# Patient Record
Sex: Male | Born: 1970
Health system: Southern US, Community
[De-identification: ages and names within clinical notes are randomized; demographics above are authoritative.]

## PROBLEM LIST (undated history)

## (undated) DIAGNOSIS — K219 Gastro-esophageal reflux disease without esophagitis: Secondary | ICD-10-CM

## (undated) DIAGNOSIS — K5900 Constipation, unspecified: Secondary | ICD-10-CM

## (undated) DIAGNOSIS — I10 Essential (primary) hypertension: Secondary | ICD-10-CM

## (undated) DIAGNOSIS — T7840XA Allergy, unspecified, initial encounter: Secondary | ICD-10-CM

## (undated) HISTORY — DX: Allergy, unspecified, initial encounter: T78.40XA

## (undated) HISTORY — PX: UPPER GASTROINTESTINAL ENDOSCOPY: SHX188

## (undated) HISTORY — DX: Gastro-esophageal reflux disease without esophagitis: K21.9

## (undated) HISTORY — DX: Constipation, unspecified: K59.00

## (undated) HISTORY — DX: Essential (primary) hypertension: I10

---

## 2003-01-07 ENCOUNTER — Emergency Department (HOSPITAL_COMMUNITY): Admission: EM | Admit: 2003-01-07 | Discharge: 2003-01-07 | Payer: Self-pay | Admitting: Emergency Medicine

## 2003-01-07 ENCOUNTER — Encounter: Payer: Self-pay | Admitting: Emergency Medicine

## 2008-07-22 ENCOUNTER — Ambulatory Visit: Payer: Self-pay | Admitting: Family Medicine

## 2008-07-22 DIAGNOSIS — K219 Gastro-esophageal reflux disease without esophagitis: Secondary | ICD-10-CM | POA: Insufficient documentation

## 2008-07-22 DIAGNOSIS — J45909 Unspecified asthma, uncomplicated: Secondary | ICD-10-CM | POA: Insufficient documentation

## 2008-07-22 DIAGNOSIS — B009 Herpesviral infection, unspecified: Secondary | ICD-10-CM | POA: Insufficient documentation

## 2008-07-27 ENCOUNTER — Emergency Department (HOSPITAL_COMMUNITY): Admission: EM | Admit: 2008-07-27 | Discharge: 2008-07-27 | Payer: Self-pay | Admitting: Emergency Medicine

## 2008-08-02 LAB — CONVERTED CEMR LAB
Albumin: 3.9 g/dL (ref 3.5–5.2)
Alkaline Phosphatase: 58 units/L (ref 39–117)
BUN: 16 mg/dL (ref 6–23)
Basophils Absolute: 0 10*3/uL (ref 0.0–0.1)
Bilirubin, Direct: 0.1 mg/dL (ref 0.0–0.3)
CO2: 30 meq/L (ref 19–32)
Chloride: 110 meq/L (ref 96–112)
Cholesterol: 174 mg/dL (ref 0–200)
Creatinine, Ser: 1.1 mg/dL (ref 0.4–1.5)
Eosinophils Absolute: 0.2 10*3/uL (ref 0.0–0.7)
Eosinophils Relative: 3.5 % (ref 0.0–5.0)
GFR calc Af Amer: 98 mL/min
Glucose, Bld: 86 mg/dL (ref 70–99)
HCT: 45 % (ref 39.0–52.0)
HDL: 28.3 mg/dL — ABNORMAL LOW (ref >39.0)
LDL Cholesterol: 136 mg/dL — ABNORMAL HIGH (ref 0–99)
Lymphocytes Relative: 44.9 % (ref 12.0–46.0)
MCV: 84.8 fL (ref 78.0–100.0)
Monocytes Absolute: 0.5 10*3/uL (ref 0.1–1.0)
Monocytes Relative: 7.7 % (ref 3.0–12.0)
Neutro Abs: 2.7 10*3/uL (ref 1.4–7.7)
Platelets: 266 10*3/uL (ref 150–400)
Potassium: 4.3 meq/L (ref 3.5–5.1)
RBC: 5.31 M/uL (ref 4.22–5.81)
RDW: 13 % (ref 11.5–14.6)
Sodium: 145 meq/L (ref 135–145)
TSH: 1.08 microintl units/mL (ref 0.35–5.50)
Total Bilirubin: 0.7 mg/dL (ref 0.3–1.2)
Total CHOL/HDL Ratio: 6.1
Total Protein: 7.6 g/dL (ref 6.0–8.3)
Triglycerides: 50 mg/dL (ref 0–149)
VLDL: 10 mg/dL (ref 0–40)

## 2008-10-26 ENCOUNTER — Ambulatory Visit: Payer: Self-pay | Admitting: Family Medicine

## 2008-10-26 DIAGNOSIS — K2970 Gastritis, unspecified, without bleeding: Secondary | ICD-10-CM | POA: Insufficient documentation

## 2008-10-26 DIAGNOSIS — K2990 Gastroduodenitis, unspecified, without bleeding: Secondary | ICD-10-CM

## 2008-11-11 ENCOUNTER — Telehealth: Payer: Self-pay | Admitting: Family Medicine

## 2009-03-03 ENCOUNTER — Telehealth: Payer: Self-pay | Admitting: Family Medicine

## 2010-05-14 ENCOUNTER — Ambulatory Visit: Payer: Self-pay | Admitting: Family Medicine

## 2010-05-14 DIAGNOSIS — M722 Plantar fascial fibromatosis: Secondary | ICD-10-CM

## 2010-07-16 ENCOUNTER — Emergency Department (HOSPITAL_COMMUNITY): Admission: EM | Admit: 2010-07-16 | Discharge: 2010-07-16 | Payer: Self-pay | Admitting: Emergency Medicine

## 2010-09-03 ENCOUNTER — Encounter: Payer: Self-pay | Admitting: Family Medicine

## 2010-11-02 ENCOUNTER — Ambulatory Visit: Payer: Self-pay | Admitting: Internal Medicine

## 2010-11-02 DIAGNOSIS — R1012 Left upper quadrant pain: Secondary | ICD-10-CM

## 2010-12-25 NOTE — Assessment & Plan Note (Signed)
Summary: STOMACH DISCOMFORT // RS   Vital Signs:  Patient profile:   40 year old male Weight:      288 pounds O2 Sat:      97 % on Room air Temp:     98.7 degrees F oral Pulse rate:   69 / minute BP sitting:   130 / 80  (left arm) Cuff size:   large  Vitals Entered By: Romualdo Bolk, CMA (AAMA) (November 02, 2010 9:27 AM)  O2 Flow:  Room air CC: Pt is having LUQ buldge in his stomach that has gotten bigger. Pt feels pressure when he eats and states that he doesn't feel full after eating.   CC:  Pt is having LUQ buldge in his stomach that has gotten bigger. Pt feels pressure when he eats and states that he doesn't feel full after eating.[name]  History of Present Illness: Patient presents to clinic as a workin for evaluation of abdominal discomfort.  Pt with longstanding h/o GERD previously successfully treated with ppi but dc'ed the medication several years ago after dietary change.  Now presents with recent increase in belching/bloating worse postprandially. Has intermittent LUQ discomfort without radiation or tenderness. Denies n/v, hematemesis, hematochezia, significant change in bowel habits or wt loss. Denies typical "heartburn" symptoms or dysphagia.   Preventive Screening-Counseling & Management  Alcohol-Tobacco     Smoking Status: never  Current Medications (verified): 1)  Aciphex 20 Mg Tbec (Rabeprazole Sodium) .... Once Daily 2)  Indomethacin 50 Mg Caps (Indomethacin) .... Three Times A Day As Needed Pain 3)  Ventolin Hfa 108 (90 Base) Mcg/act Aers (Albuterol Sulfate) .... 2 Puffs Q4h As Needed  Allergies (verified): No Known Drug Allergies  Review of Systems      See HPI  Physical Exam  General:  Well-developed,well-nourished,in no acute distress; alert,appropriate and cooperative throughout examination Abdomen:  soft, normal bowel sounds, no distention, no masses, no guarding, no rigidity, no rebound tenderness, no hepatomegaly, and no splenomegaly.  Minimal  discomfort to palpation LUQ.   Impression & Recommendations:  Problem # 1:  ABDOMINAL PAIN, LEFT UPPER QUADRANT (ICD-789.02) Suspect underlying gastritis. Benign exam and hx without red flags. Recommend beginning PPI therapy. Followup closely if no improvement or worsening.  Complete Medication List: 1)  Indomethacin 50 Mg Caps (Indomethacin) .... Three times a day as needed pain 2)  Ventolin Hfa 108 (90 Base) Mcg/act Aers (Albuterol sulfate) .... 2 puffs q4h as needed 3)  Aciphex 20 Mg Tbec (Rabeprazole sodium) .... One by mouth qd  Patient Instructions: 1)  Please schedule a follow-up appointment as needed. 2)  Avoid foods high in acid (tomatoes, citrus juices, spicy foods). Avoid eating within two hours of lying down or before exercising. Do not over eat; try smaller more frequent meals. Elevate head of bed twelve inches when sleeping. Prescriptions: ACIPHEX 20 MG TBEC (RABEPRAZOLE SODIUM) one by mouth qd  #30 x 11   Entered and Authorized by:   Edwyna Perfect MD   Signed by:   Edwyna Perfect MD on 11/02/2010   Method used:   Electronically to        [facility] [address] #[id number]* (retail)       [address] [place], [place]  [address]       Ph: [phone]       Fax: [phone]   RxID:   [id number]    Orders Added: 1)  Est. Patient Level IV [38756]

## 2010-12-25 NOTE — Letter (Signed)
Summary: [facility]  [facility]   Imported By: Maryln Gottron 09/19/2010 14:49:58  _____________________________________________________________________  External Attachment:    Type:   Image     Comment:   External Document

## 2010-12-25 NOTE — Assessment & Plan Note (Signed)
Summary: L FOOT PAIN // RS   Vital Signs:  Patient profile:   40 year old male Weight:      275 pounds BMI:     41.96 BP sitting:   118 / 84  (left arm) Cuff size:   large  Vitals Entered By: Raechel Ache, RN (May 14, 2010 3:31 PM) CC: C/o L foot pain which started last night, feels like a cramp and hurts to flex.   History of Present Illness: Here for the sudden onset of pain in the bottom of the left foot yesterday. No trauma but he had played golf on Saturday. Felt okay yesterday morning, but then after lying on the couch awhile he had sharp pains in the foot as tried to stand up. The arch is very painful, and it hurts to walk. no swelling.   Allergies (verified): No Known Drug Allergies  Past History:  Past Medical History: Reviewed history from 07/22/2008 and no changes required. Asthma GERD  Past Surgical History: Reviewed history from 07/22/2008 and no changes required. Denies surgical history  Review of Systems  The patient denies anorexia, fever, weight loss, weight gain, vision loss, decreased hearing, hoarseness, chest pain, syncope, dyspnea on exertion, peripheral edema, prolonged cough, headaches, hemoptysis, abdominal pain, melena, hematochezia, severe indigestion/heartburn, hematuria, incontinence, genital sores, muscle weakness, suspicious skin lesions, transient blindness, difficulty walking, depression, unusual weight change, abnormal bleeding, enlarged lymph nodes, angioedema, breast masses, and testicular masses.    Physical Exam  General:  walks with a limp Msk:  the arch of the left foot is very tender from the ball to the heel. No swelling or erythema   Impression & Recommendations:  Problem # 1:  PLANTAR FASCIITIS (ICD-728.71)  His updated medication list for this problem includes:    Indomethacin 50 Mg Caps (Indomethacin) .[name]... Three times a day as needed pain  Complete Medication List: 1)  Aciphex 20 Mg Tbec (Rabeprazole sodium) .... Once  daily 2)  Indomethacin 50 Mg Caps (Indomethacin) .... Three times a day as needed pain  Patient Instructions: 1)  off work today until 05-21-10. Rest, stay off feet. Use heat. 2)  Please schedule a follow-up appointment as needed .  Prescriptions: INDOMETHACIN 50 MG CAPS (INDOMETHACIN) three times a day as needed pain  #60 x 2   Entered and Authorized by:   Nelwyn Salisbury MD   Signed by:   Nelwyn Salisbury MD on 05/14/2010   Method used:   Electronically to        [facility] [address] #[id number]* (retail)       [address] [place], [place]  [address]       Ph: [phone]       Fax: [phone]   RxID:   [id number]

## 2011-06-07 ENCOUNTER — Other Ambulatory Visit (INDEPENDENT_AMBULATORY_CARE_PROVIDER_SITE_OTHER): Payer: BC Managed Care – PPO

## 2011-06-07 DIAGNOSIS — Z0000 Encounter for general adult medical examination without abnormal findings: Secondary | ICD-10-CM

## 2011-06-07 LAB — LIPID PANEL
Cholesterol: 180 mg/dL (ref 0–200)
HDL: 42.8 mg/dL (ref >39.00)
LDL Cholesterol: 125 mg/dL — ABNORMAL HIGH (ref 0–99)
Triglycerides: 59 mg/dL (ref 0.0–149.0)
VLDL: 11.8 mg/dL (ref 0.0–40.0)

## 2011-06-07 LAB — HEPATIC FUNCTION PANEL
ALT: 29 U/L (ref 0–53)
AST: 16 U/L (ref 0–37)
Albumin: 4.4 g/dL (ref 3.5–5.2)
Alkaline Phosphatase: 48 U/L (ref 39–117)
Bilirubin, Direct: 0.1 mg/dL (ref 0.0–0.3)
Total Bilirubin: 0.5 mg/dL (ref 0.3–1.2)
Total Protein: 7.8 g/dL (ref 6.0–8.3)

## 2011-06-07 LAB — CBC WITH DIFFERENTIAL/PLATELET
Basophils Absolute: 0 10*3/uL (ref 0.0–0.1)
Basophils Relative: 0.5 % (ref 0.0–3.0)
Eosinophils Absolute: 0.2 10*3/uL (ref 0.0–0.7)
Eosinophils Relative: 3.1 % (ref 0.0–5.0)
HCT: 45.7 % (ref 39.0–52.0)
Hemoglobin: 15.6 g/dL (ref 13.0–17.0)
Lymphocytes Relative: 44.8 % (ref 12.0–46.0)
Lymphs Abs: 2.8 10*3/uL (ref 0.7–4.0)
MCHC: 34.1 g/dL (ref 30.0–36.0)
MCV: 85.6 fl (ref 78.0–100.0)
Monocytes Absolute: 0.4 10*3/uL (ref 0.1–1.0)
Monocytes Relative: 6.9 % (ref 3.0–12.0)
Neutro Abs: 2.8 10*3/uL (ref 1.4–7.7)
Neutrophils Relative %: 44.7 % (ref 43.0–77.0)
Platelets: 288 10*3/uL (ref 150.0–400.0)
RBC: 5.34 Mil/uL (ref 4.22–5.81)
RDW: 13.9 % (ref 11.5–14.6)
WBC: 6.3 10*3/uL (ref 4.5–10.5)

## 2011-06-07 LAB — BASIC METABOLIC PANEL
BUN: 15 mg/dL (ref 6–23)
CO2: 25 mEq/L (ref 19–32)
Calcium: 8.9 mg/dL (ref 8.4–10.5)
Chloride: 110 mEq/L (ref 96–112)
GFR: 96.34 mL/min (ref >60.00)
Glucose, Bld: 78 mg/dL (ref 70–99)
Potassium: 3.8 mEq/L (ref 3.5–5.1)
Sodium: 140 mEq/L (ref 135–145)

## 2011-06-07 LAB — POCT URINALYSIS DIPSTICK
Bilirubin, UA: NEGATIVE
Glucose, UA: NEGATIVE
Ketones, UA: NEGATIVE
Nitrite, UA: NEGATIVE
Protein, UA: NEGATIVE
Spec Grav, UA: 1.02
Urobilinogen, UA: 0.2
pH, UA: 6.5

## 2011-06-07 LAB — TSH: TSH: 1.91 u[IU]/mL (ref 0.35–5.50)

## 2011-06-10 ENCOUNTER — Encounter: Payer: Self-pay | Admitting: Family Medicine

## 2011-06-11 ENCOUNTER — Encounter: Payer: Self-pay | Admitting: Family Medicine

## 2011-06-11 ENCOUNTER — Ambulatory Visit (INDEPENDENT_AMBULATORY_CARE_PROVIDER_SITE_OTHER): Payer: BC Managed Care – PPO | Admitting: Family Medicine

## 2011-06-11 VITALS — BP 124/86 | HR 69 | Temp 98.2°F | Wt 288.0 lb

## 2011-06-11 DIAGNOSIS — Z0000 Encounter for general adult medical examination without abnormal findings: Secondary | ICD-10-CM

## 2011-06-11 MED ORDER — RABEPRAZOLE SODIUM 20 MG PO TBEC: 20.0000 mg | DELAYED_RELEASE_TABLET | Freq: Every day | ORAL | Status: DC

## 2011-06-11 MED ORDER — FAMCICLOVIR 500 MG PO TABS: 500.0000 mg | ORAL_TABLET | Freq: Two times a day (BID) | ORAL | Status: DC

## 2011-06-11 NOTE — Progress Notes (Signed)
  Subjective:    Patient ID: Daniel [name], male    DOB: 02/23/1971, 40 y.o.   MRN: [id number]  HPI 40 yr old male for a cpx. He has one recent problem to talk about. He has never had a BP problem, but several weeks ago he began to experience HAs, numbness in the left arm, and chest tightness. No chest pains or sweats or nausea. He went to his pharmacy on 05-29-11 and found his BP to be 168/128. He then went to an Urgent Care and his BP was 152/104. His EKG was normal. He does not use tobacco. He realized that he has been using Herbalife supplements twice a day for 5 months, and he has been drinking a lot of Gatorade daily. He stopped both these 2 weeks ago, and his BP has come back down to normal. His symptoms have all stopped, and he feels fine again.    Review of Systems  Constitutional: Negative.   HENT: Negative.   Eyes: Negative.   Respiratory: Negative.   Cardiovascular: Negative.   Gastrointestinal: Negative.   Genitourinary: Negative.   Musculoskeletal: Negative.   Skin: Negative.   Neurological: Negative.   Hematological: Negative.   Psychiatric/Behavioral: Negative.        Objective:   Physical Exam  Constitutional: He is oriented to person, place, and time. He appears well-developed and well-nourished. No distress.  HENT:  Head: Normocephalic and atraumatic.  Right Ear: External ear normal.  Left Ear: External ear normal.  Nose: Nose normal.  Mouth/Throat: Oropharynx is clear and moist. No oropharyngeal exudate.  Eyes: Conjunctivae and EOM are normal. Pupils are equal, round, and reactive to light. Right eye exhibits no discharge. Left eye exhibits no discharge. No scleral icterus.  Neck: Neck supple. No JVD present. No tracheal deviation present. No thyromegaly present.  Cardiovascular: Normal rate, regular rhythm, normal heart sounds and intact distal pulses.  Exam reveals no gallop and no friction rub.   No murmur heard. Pulmonary/Chest: Effort normal and breath  sounds normal. No respiratory distress. He has no wheezes. He has no rales. He exhibits no tenderness.  Abdominal: Soft. Bowel sounds are normal. He exhibits no distension and no mass. There is no tenderness. There is no rebound and no guarding.  Genitourinary: Rectum normal, prostate normal and penis normal. Guaiac negative stool. No penile tenderness.  Musculoskeletal: Normal range of motion. He exhibits no edema and no tenderness.  Lymphadenopathy:    He has no cervical adenopathy.  Neurological: He is alert and oriented to person, place, and time. He has normal reflexes. No cranial nerve deficit. He exhibits normal muscle tone. Coordination normal.  Skin: Skin is warm and dry. No rash noted. He is not diaphoretic. No erythema. No pallor.  Psychiatric: He has a normal mood and affect. His behavior is normal. Judgment and thought content normal.          Assessment & Plan:  I think his recent high BP readings resulted from the Herbalife supplements he was taking and from excessive sodium intake. He will avoid the Herbalife as well as Gatorade and sodas. He will drink water instead. He will monitor his diet, and I advised him to not take in over 2000 mg of sodium a day. He needs to lose weight and exercise. Recheck in 90 days

## 2011-06-13 ENCOUNTER — Emergency Department (INDEPENDENT_AMBULATORY_CARE_PROVIDER_SITE_OTHER): Payer: BC Managed Care – PPO

## 2011-06-13 ENCOUNTER — Encounter (HOSPITAL_BASED_OUTPATIENT_CLINIC_OR_DEPARTMENT_OTHER): Payer: Self-pay | Admitting: *Deleted

## 2011-06-13 ENCOUNTER — Emergency Department (HOSPITAL_BASED_OUTPATIENT_CLINIC_OR_DEPARTMENT_OTHER)
Admission: EM | Admit: 2011-06-13 | Discharge: 2011-06-13 | Disposition: A | Discharge status: Home / Self Care | Payer: BC Managed Care – PPO | Attending: Emergency Medicine | Admitting: Emergency Medicine

## 2011-06-13 DIAGNOSIS — M79609 Pain in unspecified limb: Secondary | ICD-10-CM

## 2011-06-13 DIAGNOSIS — K219 Gastro-esophageal reflux disease without esophagitis: Secondary | ICD-10-CM | POA: Insufficient documentation

## 2011-06-13 DIAGNOSIS — M779 Enthesopathy, unspecified: Secondary | ICD-10-CM

## 2011-06-13 DIAGNOSIS — M7989 Other specified soft tissue disorders: Secondary | ICD-10-CM

## 2011-06-13 DIAGNOSIS — M7730 Calcaneal spur, unspecified foot: Secondary | ICD-10-CM

## 2011-06-13 DIAGNOSIS — J45909 Unspecified asthma, uncomplicated: Secondary | ICD-10-CM | POA: Insufficient documentation

## 2011-06-13 DIAGNOSIS — M79671 Pain in right foot: Secondary | ICD-10-CM

## 2011-06-13 MED ORDER — HYDROCODONE-ACETAMINOPHEN 5-500 MG PO TABS: 1.0000 | ORAL_TABLET | Freq: Four times a day (QID) | ORAL | Status: AC | PRN

## 2011-06-13 NOTE — ED Notes (Signed)
Pt reports left foot pain since yesterday. States bent down at work and when he stood up had sharp pain to top of left foot. No swelling noted. Pt able to move toes and ankle. States painful to walk on and tender to touch.

## 2011-06-13 NOTE — ED Provider Notes (Signed)
History     Chief Complaint  Patient presents with  . Foot Pain   Patient is a 40 y.o. male presenting with lower extremity pain.  Foot Pain This is a new problem. The current episode started yesterday. The problem occurs constantly. The problem has been gradually worsening. The symptoms are aggravated by walking, twisting, stress and standing. The symptoms are relieved by rest. He has tried nothing for the symptoms. The treatment provided no relief.  pt was bending over yesterday and when stood up developed a pain in the inner side of his left foot that is worse with walking and standing and not going away.  Past Medical History  Diagnosis Date  . Asthma   . GERD (gastroesophageal reflux disease)     History reviewed. No pertinent past surgical history.  Family History  Problem Relation Age of Onset  . Hyperlipidemia    . Hypertension    . Stroke      History  Substance Use Topics  . Smoking status: Never Smoker   . Smokeless tobacco: Not on file  . Alcohol Use: No      Review of Systems  All other systems reviewed and are negative.    Physical Exam  BP 158/87  Pulse 80  Temp(Src) 97.7 F (36.5 C) (Oral)  Resp 18  Ht 5\' 8"  (1.727 m)  Wt 278 lb (126.1 kg)  BMI 42.27 kg/m2  SpO2 99%  Physical Exam  Nursing note and vitals reviewed. Constitutional: He is oriented to person, place, and time. He appears well-developed and well-nourished. No distress.  HENT:  Head: Normocephalic and atraumatic.  Musculoskeletal: Normal range of motion. He exhibits tenderness.       Left foot: He exhibits tenderness and bony tenderness. He exhibits no swelling and no deformity.       Feet:  Neurological: He is alert and oriented to person, place, and time.  Skin: Skin is warm and dry. No rash noted. No erythema.  Psychiatric: He has a normal mood and affect. His behavior is normal.    ED Course  Procedures  MDM Pt with pain in the left medial foot without injury.  No  signs of infection.  Plain film showed bone spurs but no acute abnormalities.  Will have pt f/u with sports med if supportive treatment does not work.  Dg Foot Complete Left  06/13/2011  *RADIOLOGY REPORT*  Clinical Data: Pain and swelling  LEFT FOOT - COMPLETE 3+ VIEW  Comparison: None.  Findings: There are small posterior and plantar calcaneal spurs. There is no evidence of fracture or dislocation.  No periosteal reaction or cortical destruction are identified.  No soft tissue gas or calcifications are present.  IMPRESSION: No evidence of acute abnormality.  Small posterior and plantar calcaneal spurs.  Original Report Authenticated By: Brandon Melnick, M.D.      Gwyneth Sprout, MD 06/13/11 1239

## 2011-09-11 ENCOUNTER — Encounter: Payer: Self-pay | Admitting: Family Medicine

## 2011-09-11 ENCOUNTER — Ambulatory Visit (INDEPENDENT_AMBULATORY_CARE_PROVIDER_SITE_OTHER): Payer: BC Managed Care – PPO | Admitting: Family Medicine

## 2011-09-11 VITALS — BP 138/98 | HR 83 | Temp 98.7°F | Wt 287.0 lb

## 2011-09-11 DIAGNOSIS — L309 Dermatitis, unspecified: Secondary | ICD-10-CM

## 2011-09-11 DIAGNOSIS — L259 Unspecified contact dermatitis, unspecified cause: Secondary | ICD-10-CM

## 2011-09-11 DIAGNOSIS — I10 Essential (primary) hypertension: Secondary | ICD-10-CM

## 2011-09-11 MED ORDER — LOSARTAN POTASSIUM-HCTZ 50-12.5 MG PO TABS: 1.0000 | ORAL_TABLET | Freq: Every day | ORAL | Status: DC

## 2011-09-11 MED ORDER — TRIAMCINOLONE ACETONIDE 0.5 % EX CREA: TOPICAL_CREAM | Freq: Three times a day (TID) | CUTANEOUS | Status: DC

## 2011-09-11 NOTE — Progress Notes (Signed)
  Subjective:    Patient ID: Daniel [name], male    DOB: 05/17/1971, 40 y.o.   MRN: [id number]  HPI Here to recheck BP and to ask about a red irritated area on his face that appeared about 3 weeks ago. He has changed his diet, cut back on salt use, and stopped the supplements we talked about last time. He feels good but has not lost any weight.    Review of Systems  Respiratory: Negative.   Cardiovascular: Negative.   Skin: Positive for rash.       Objective:   Physical Exam  Constitutional: He appears well-developed and well-nourished.  Neck: Neck supple. No thyromegaly present.  Cardiovascular: Normal rate, regular rhythm, normal heart sounds and intact distal pulses.   Pulmonary/Chest: Effort normal and breath sounds normal.  Lymphadenopathy:    He has no cervical adenopathy.  Skin:       Small hypopigmented area beside the right corner of his mouth          Assessment & Plan:  Start on HTN meds as below. Try Triamcinolone for the eczema. Recheck in one month

## 2012-03-11 IMAGING — CR DG FOOT COMPLETE 3+V*L*
3 series · 3 of 3 positions shown · non-contrast
Comparison: None.

CLINICAL DATA: Pain and swelling

LEFT FOOT - COMPLETE 3+ VIEW

[t foot ap left]
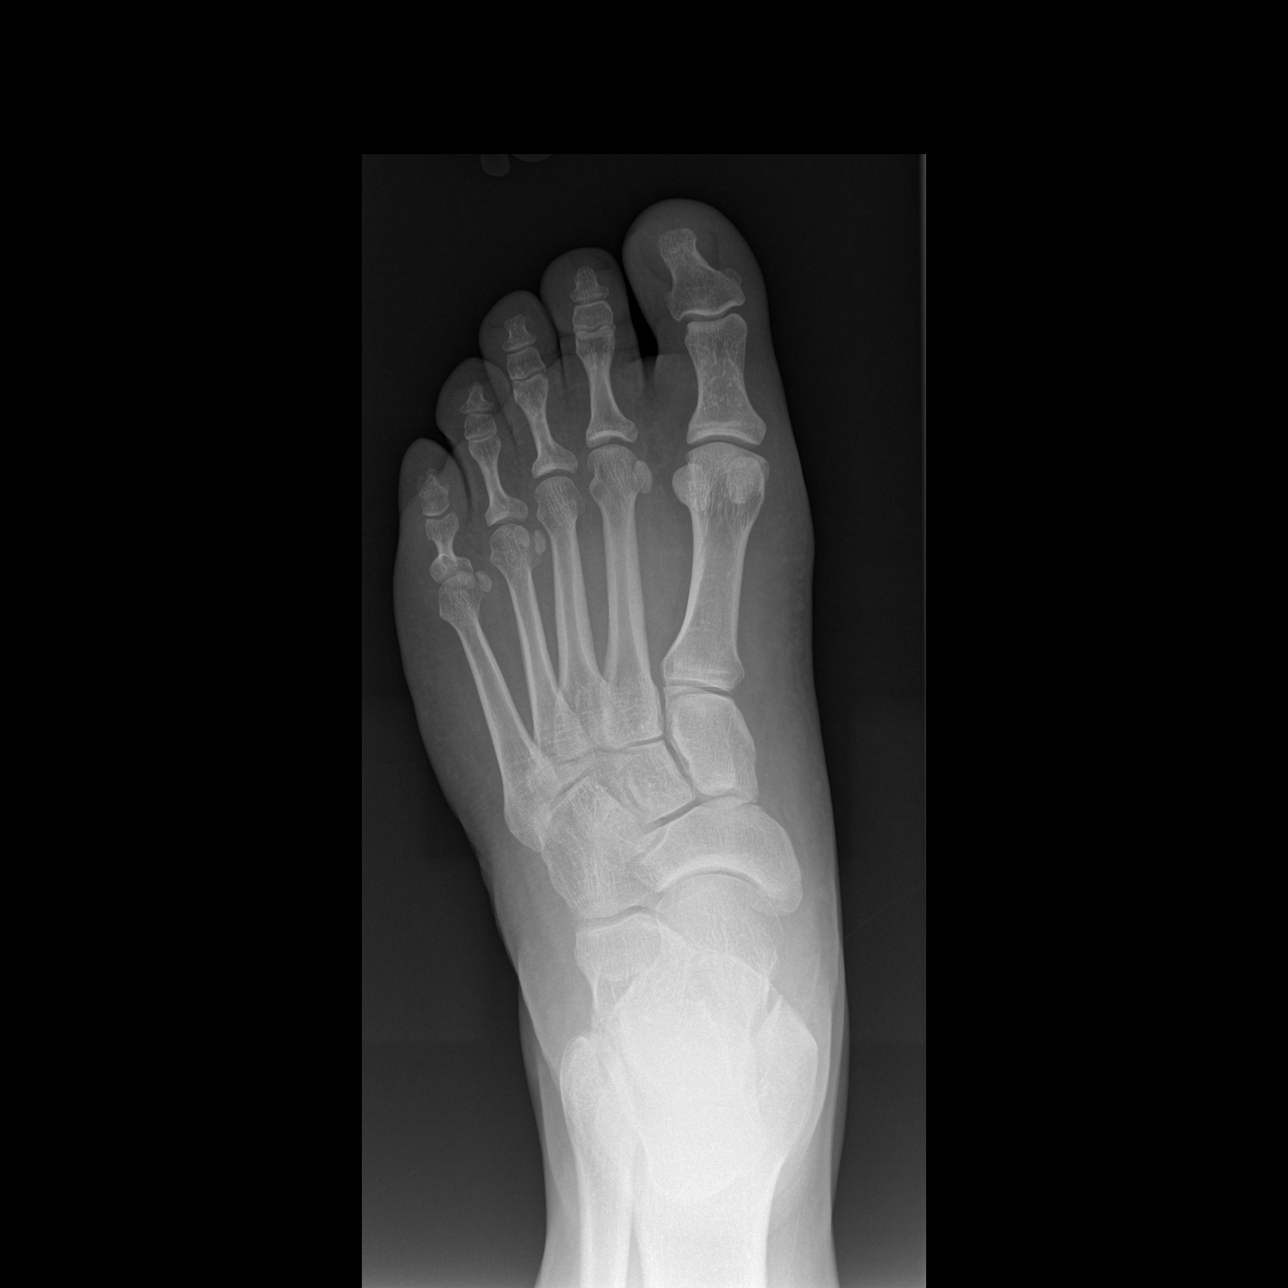

[t foot oblique left]
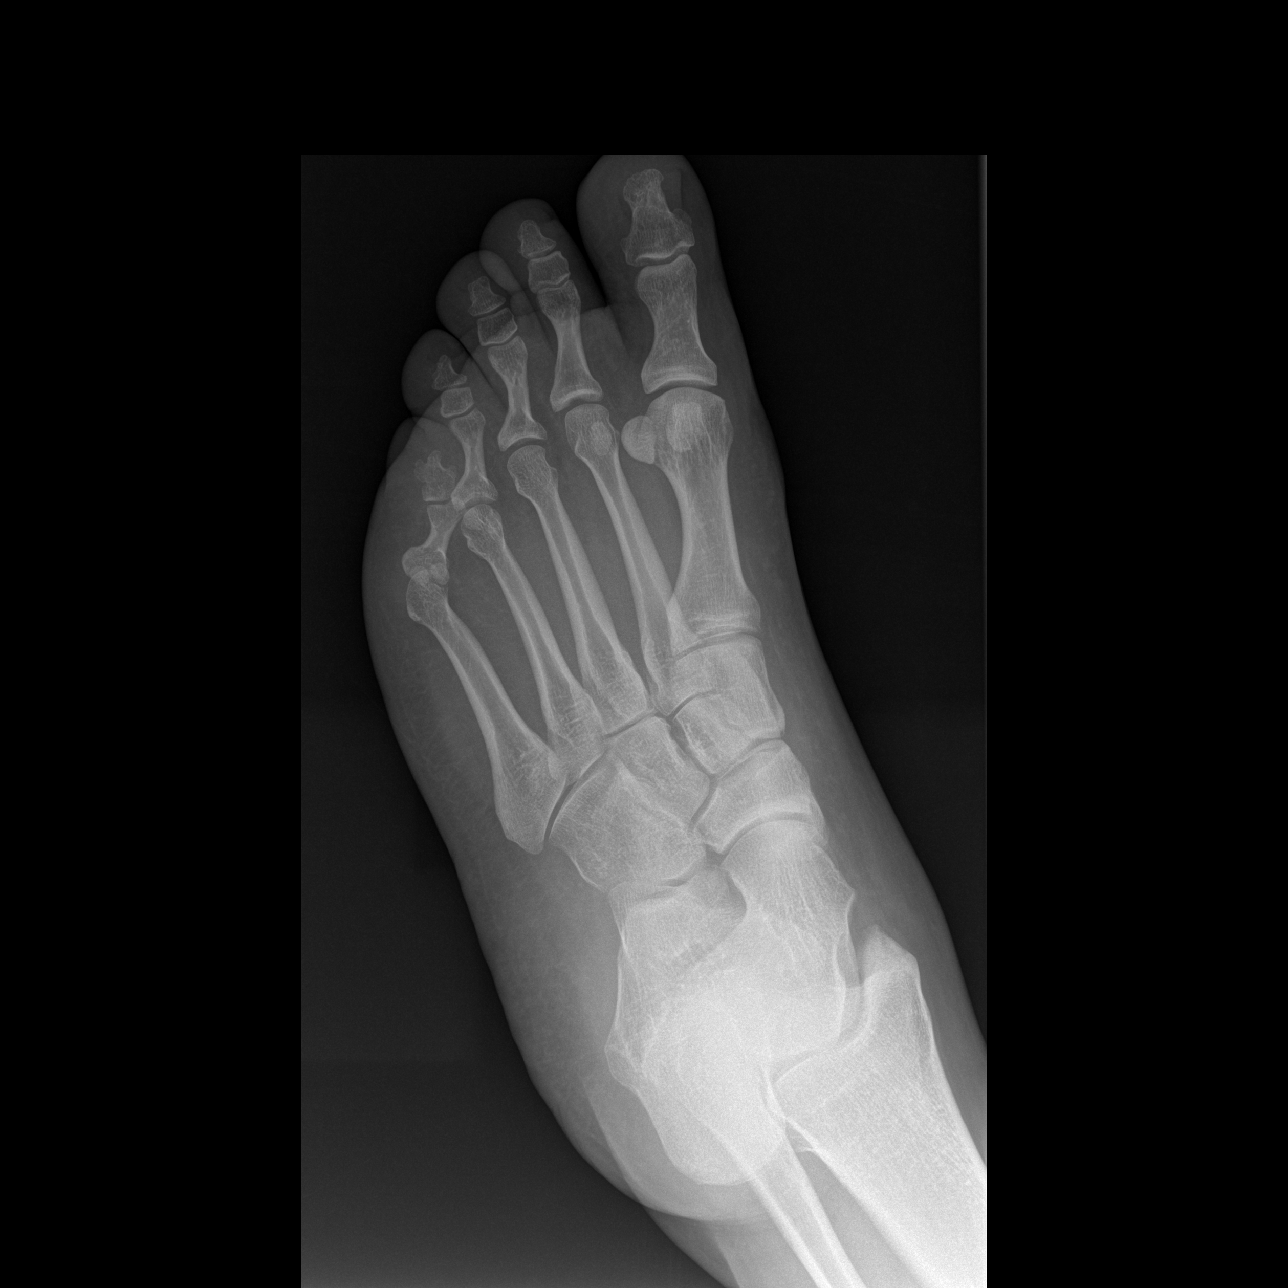

[t foot lat left]
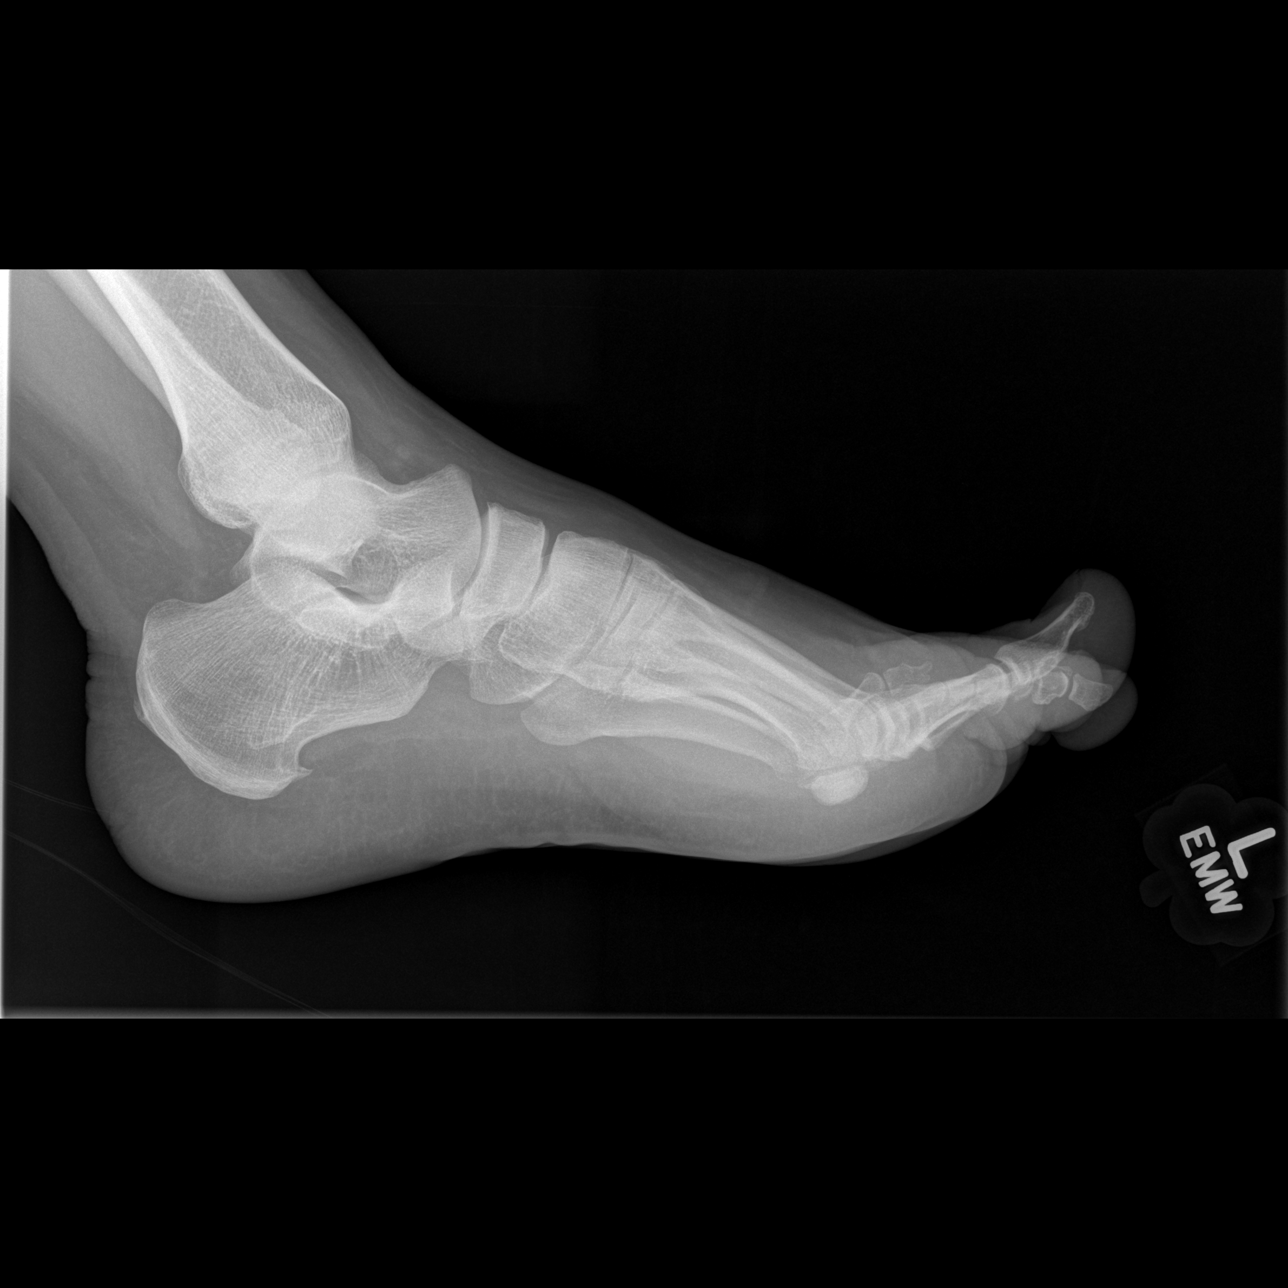

[3 of 3 positions shown; findings below may reference images not displayed]

FINDINGS: There are small posterior and plantar calcaneal spurs.
There is no evidence of fracture or dislocation.  No periosteal
reaction or cortical destruction are identified.  No soft tissue
gas or calcifications are present.
IMPRESSION: No evidence of acute abnormality.

Small posterior and plantar calcaneal spurs.

## 2012-04-22 ENCOUNTER — Other Ambulatory Visit: Payer: Self-pay | Admitting: Family Medicine

## 2012-07-18 ENCOUNTER — Encounter (HOSPITAL_BASED_OUTPATIENT_CLINIC_OR_DEPARTMENT_OTHER): Payer: Self-pay | Admitting: *Deleted

## 2012-07-18 ENCOUNTER — Emergency Department (HOSPITAL_BASED_OUTPATIENT_CLINIC_OR_DEPARTMENT_OTHER)
Admission: EM | Admit: 2012-07-18 | Discharge: 2012-07-18 | Disposition: A | Discharge status: Home / Self Care | Payer: 59 | Attending: Emergency Medicine | Admitting: Emergency Medicine

## 2012-07-18 DIAGNOSIS — T63441A Toxic effect of venom of bees, accidental (unintentional), initial encounter: Secondary | ICD-10-CM

## 2012-07-18 DIAGNOSIS — T63461A Toxic effect of venom of wasps, accidental (unintentional), initial encounter: Secondary | ICD-10-CM | POA: Insufficient documentation

## 2012-07-18 DIAGNOSIS — T6391XA Toxic effect of contact with unspecified venomous animal, accidental (unintentional), initial encounter: Secondary | ICD-10-CM | POA: Insufficient documentation

## 2012-07-18 LAB — CBC WITH DIFFERENTIAL/PLATELET
Eosinophils Relative: 1 % (ref 0–5)
HCT: 45.6 % (ref 39.0–52.0)
Lymphocytes Relative: 33 % (ref 12–46)
Lymphs Abs: 3.9 10*3/uL (ref 0.7–4.0)
MCV: 79.4 fL (ref 78.0–100.0)
Neutro Abs: 6.8 10*3/uL (ref 1.7–7.7)
Platelets: 317 10*3/uL (ref 150–400)
RBC: 5.74 MIL/uL (ref 4.22–5.81)
WBC: 11.7 10*3/uL — ABNORMAL HIGH (ref 4.0–10.5)

## 2012-07-18 LAB — BASIC METABOLIC PANEL
CO2: 24 mEq/L (ref 19–32)
Calcium: 9.9 mg/dL (ref 8.4–10.5)
Chloride: 103 mEq/L (ref 96–112)
Glucose, Bld: 112 mg/dL — ABNORMAL HIGH (ref 70–99)
Sodium: 138 mEq/L (ref 135–145)

## 2012-07-18 MED ORDER — DIPHENHYDRAMINE HCL 25 MG PO TABS: 25.0000 mg | ORAL_TABLET | Freq: Four times a day (QID) | ORAL | Status: DC

## 2012-07-18 MED ORDER — FAMOTIDINE 20 MG PO TABS: 20.0000 mg | ORAL_TABLET | Freq: Two times a day (BID) | ORAL | Status: DC

## 2012-07-18 MED ORDER — SODIUM CHLORIDE 0.9 % IV SOLN: INTRAVENOUS | Status: DC

## 2012-07-18 MED ORDER — PREDNISONE 10 MG PO TABS: 40.0000 mg | ORAL_TABLET | Freq: Every day | ORAL | Status: DC

## 2012-07-18 MED ORDER — SODIUM CHLORIDE 0.9 % IV SOLN
Freq: Once | INTRAVENOUS | Status: AC
  Administered 2012-07-18: 21:00:00 via INTRAVENOUS

## 2012-07-18 MED ORDER — METHYLPREDNISOLONE SODIUM SUCC 125 MG IJ SOLR
125.0000 mg | Freq: Once | INTRAMUSCULAR | Status: AC
  Administered 2012-07-18: 125 mg via INTRAVENOUS
  Filled 2012-07-18: qty 2

## 2012-07-18 MED ORDER — SODIUM CHLORIDE 0.9 % IV BOLUS (SEPSIS)
1000.0000 mL | Freq: Once | INTRAVENOUS | Status: AC
  Administered 2012-07-18: 1000 mL via INTRAVENOUS

## 2012-07-18 MED ORDER — EPINEPHRINE 0.3 MG/0.3ML IJ DEVI: 0.3000 mg | INTRAMUSCULAR | Status: DC | PRN

## 2012-07-18 MED ORDER — FAMOTIDINE IN NACL 20-0.9 MG/50ML-% IV SOLN
20.0000 mg | Freq: Once | INTRAVENOUS | Status: AC
  Administered 2012-07-18: 20 mg via INTRAVENOUS
  Filled 2012-07-18: qty 50

## 2012-07-18 NOTE — ED Notes (Addendum)
Pt was stung by several yellow jackets about 4 hrs ago. Broke out in rash. Took Benadryl. Syncopal episode 30 min ago. Diaphoretic. Hx hospitalization after "swallowing a bee". "Bad situation". Taken to [facility].

## 2012-07-18 NOTE — ED Notes (Signed)
Pt assisted to stretcher- ekg done- pt states he "feels better" lying down- piv started and labs drawn and held- pt on cardiac monitor and pulse ox- EDP Zackowski updated- NS bolus 1000cc infusing- family at bedside

## 2012-07-18 NOTE — ED Notes (Signed)
Zackowski MD at bedside. 

## 2012-07-18 NOTE — ED Provider Notes (Signed)
History  This chart was scribed for Shelda Jakes, MD by Ladona Ridgel Day. This patient was seen in room MHT14/MHT14 and the patient's care was started at 2008.  CSN: [id number]  Arrival date & time 07/18/12  2008   First MD Initiated Contact with Patient 07/18/12 2035      Chief Complaint  Patient presents with  . Insect Bite   Patient is a 41 y.o. male presenting with allergic reaction. The history is provided by the patient. No language interpreter was used.  Allergic Reaction The primary symptoms are  shortness of breath and rash (Left chest/left neck). The primary symptoms do not include wheezing, abdominal pain, nausea or vomiting. The current episode started 3 to 5 hours ago. The problem has been gradually improving.  The onset of the reaction was associated with insect bite/sting.   Daniel [name] is a 41 y.o. male who presents to the Emergency Department complaining of a reaction after being stung by yellow jackets on his left arm/left chest/left neck about 5 hours ago at home. He felt mild SOB and light headed at home and had localized reaction to his left chest. He states his SOB has improved. His BP was 70/40 at triage and was diaphoretic. He states he took benadryl at home after being stung, laid down to rest, then woke up to use the restroom and had one syncopal episode. He states similar previous symptoms when he accidentally swallowed a bee and had low BP and O2 sats. He denies any lip or tongue swelling and he also feels better now. He denies any CP, leg swelling urinary symptoms, HA, abdominal pain, and emesis.  Past Medical History  Diagnosis Date  . Asthma   . GERD (gastroesophageal reflux disease)     History reviewed. No pertinent past surgical history.  Family History  Problem Relation Age of Onset  . Hyperlipidemia    . Hypertension    . Stroke      History  Substance Use Topics  . Smoking status: Never Smoker   . Smokeless tobacco: Never Used  . Alcohol  Use: No      Review of Systems  Constitutional: Negative for fever and chills.  HENT: Negative for facial swelling.        No lip or tongue swelling.   Respiratory: Positive for shortness of breath. Negative for chest tightness and wheezing.   Cardiovascular: Negative for chest pain and leg swelling.  Gastrointestinal: Negative for nausea, vomiting and abdominal pain.  Skin: Positive for rash (Left chest/left neck).  Neurological: Positive for syncope and light-headedness. Negative for weakness.  All other systems reviewed and are negative.    Allergies  Bee venom and Shellfish allergy  Home Medications   Current Outpatient Rx  Name Route Sig Dispense Refill  . DIPHENHYDRAMINE HCL 25 MG PO TABS Oral Take 50 mg by mouth once as needed. For bee sting    . DIPHENHYDRAMINE-ZINC ACETATE 1-0.1 % EX CREA Topical Apply 1 application topically once as needed. For bee sting    . LOSARTAN POTASSIUM-HCTZ 50-12.5 MG PO TABS Oral Take 1 tablet by mouth daily. 30 tablet 11  . ADULT MULTIVITAMIN W/MINERALS CH Oral Take 1 tablet by mouth daily.    [name] POLYVINYL ALCOHOL-POVIDONE 5-6 MG/ML OP SOLN Ophthalmic Apply 1 drop to eye once as needed. For dryness    . RABEPRAZOLE SODIUM 20 MG PO TBEC Oral Take 20 mg by mouth daily as needed. For acid reflux    . VENTOLIN  HFA 108 (90 BASE) MCG/ACT IN AERS  INHALE 2 PUFFS BY MOUTH EVERY 4 HOURS AS NEEDED 1 Inhaler 3  . DIPHENHYDRAMINE HCL 25 MG PO TABS Oral Take 1 tablet (25 mg total) by mouth every 6 (six) hours. 20 tablet 0  . EPINEPHRINE 0.3 MG/0.3ML IJ DEVI Intramuscular Inject 0.3 mLs (0.3 mg total) into the muscle as needed. 2 Device 0  . FAMOTIDINE 20 MG PO TABS Oral Take 1 tablet (20 mg total) by mouth 2 (two) times daily. 14 tablet 0  . PREDNISONE 10 MG PO TABS Oral Take 4 tablets (40 mg total) by mouth daily. 20 tablet 0    Triage Vitals: BP 100/57  Pulse 54  Temp 98.2 F (36.8 C) (Oral)  Resp 20  Ht 5\' 8"  (1.727 m)  Wt 285 lb (129.275 kg)   BMI 43.33 kg/m2  SpO2 98%  Physical Exam  Nursing note and vitals reviewed. Constitutional: He is oriented to person, place, and time. He appears well-developed and well-nourished. No distress.  HENT:  Head: Normocephalic and atraumatic.  Mouth/Throat: Oropharynx is clear and moist.       Tongue/lips are not swollen.  Eyes: EOM are normal.  Neck: Neck supple. No tracheal deviation present.  Cardiovascular: Normal rate, regular rhythm and normal heart sounds.   No murmur heard. Pulmonary/Chest: Effort normal and breath sounds normal. No respiratory distress. He has no wheezes. He has no rales.  Abdominal: Soft. Bowel sounds are normal. He exhibits no distension. There is no tenderness. There is no rebound and no guarding.  Musculoskeletal: Normal range of motion.  Neurological: He is alert and oriented to person, place, and time.  Skin: Skin is warm and dry.       Redness over left lateral chest where he was stung by bees.  Psychiatric: He has a normal mood and affect. His behavior is normal.    ED Course  Procedures (including critical care time) DIAGNOSTIC STUDIES: Oxygen Saturation is 96% on room air, adequate by my interpretation.    COORDINATION OF CARE: At 855 PM Discussed treatment plan with patient which includes IV fluids, and EKG. Patient agrees.   Labs Reviewed  CBC WITH DIFFERENTIAL - Abnormal; Notable for the following:    WBC 11.7 (*)     All other components within normal limits  BASIC METABOLIC PANEL - Abnormal; Notable for the following:    Glucose, Bld 112 (*)     Creatinine, Ser 1.80 (*)     GFR calc non Af Amer 46 (*)     GFR calc Af Amer 53 (*)     All other components within normal limits   Results for orders placed during the hospital encounter of 07/18/12  CBC WITH DIFFERENTIAL      Component Value Range   WBC 11.7 (*) 4.0 - 10.5 K/uL   RBC 5.74  4.22 - 5.81 MIL/uL   Hemoglobin 16.1  13.0 - 17.0 g/dL   HCT 74.2  59.5 - 63.8 %   MCV 79.4  78.0 -  100.0 fL   MCH 28.0  26.0 - 34.0 pg   MCHC 35.3  30.0 - 36.0 g/dL   RDW 75.6  43.3 - 29.5 %   Platelets 317  150 - 400 K/uL   Neutrophils Relative 58  43 - 77 %   Neutro Abs 6.8  1.7 - 7.7 K/uL   Lymphocytes Relative 33  12 - 46 %   Lymphs Abs 3.9  0.7 - 4.0 K/uL  Monocytes Relative 8  3 - 12 %   Monocytes Absolute 1.0  0.1 - 1.0 K/uL   Eosinophils Relative 1  0 - 5 %   Eosinophils Absolute 0.1  0.0 - 0.7 K/uL   Basophils Relative 0  0 - 1 %   Basophils Absolute 0.0  0.0 - 0.1 K/uL  BASIC METABOLIC PANEL      Component Value Range   Sodium 138  135 - 145 mEq/L   Potassium 4.4  3.5 - 5.1 mEq/L   Chloride 103  96 - 112 mEq/L   CO2 24  19 - 32 mEq/L   Glucose, Bld 112 (*) 70 - 99 mg/dL   BUN 22  6 - 23 mg/dL   Creatinine, Ser 1.61 (*) 0.50 - 1.35 mg/dL   Calcium 9.9  8.4 - 09.6 mg/dL   GFR calc non Af Amer 46 (*) >90 mL/min   GFR calc Af Amer 53 (*) >90 mL/min    Date: 07/18/2012  Rate: 78  Rhythm: normal sinus rhythm  QRS Axis: normal  Intervals: normal  ST/T Wave abnormalities: normal  Conduction Disutrbances:none  Narrative Interpretation:   Old EKG Reviewed: none available   No results found.   1. Allergic reaction to bee sting       MDM  Patient probably with a history of a bee sting allergy was stung by some yellow jackets earlier today initially took Benadryl felt okay and then later became lightheaded and passed out. Upon arrival here he was bradycardic and hypotensive with a systolic blood pressure 70/40. Patient received fluids blood pressure came up he also received a site Medrol Pepcid and now feels better. Patient has improved.   We'll send home with EpiPen continue taking Pepcid for 7 days Benadryl for 3 days and prednisone for the next 5 days.  No lip swelling no tongue swelling no wheezing no hives. No throat tightness.   I personally performed the services described in this documentation, which was scribed in my presence. The recorded information  has been reviewed and considered.           Shelda Jakes, MD 07/18/12 [phone]

## 2012-07-19 NOTE — ED Notes (Signed)
Pt ambulates without difficulty, states feels better, denies pain, denies dizziness. Rx x 4 given at d/c- family present to drive

## 2013-02-24 ENCOUNTER — Other Ambulatory Visit (INDEPENDENT_AMBULATORY_CARE_PROVIDER_SITE_OTHER): Payer: 59

## 2013-02-24 DIAGNOSIS — Z0000 Encounter for general adult medical examination without abnormal findings: Secondary | ICD-10-CM

## 2013-02-24 LAB — BASIC METABOLIC PANEL
BUN: 12 mg/dL (ref 6–23)
CO2: 27 mEq/L (ref 19–32)
Chloride: 106 mEq/L (ref 96–112)
GFR: 94.49 mL/min (ref >60.00)
Glucose, Bld: 80 mg/dL (ref 70–99)
Potassium: 4 mEq/L (ref 3.5–5.1)
Sodium: 139 mEq/L (ref 135–145)

## 2013-02-24 LAB — LIPID PANEL
HDL: 34 mg/dL — ABNORMAL LOW (ref >39.00)
Total CHOL/HDL Ratio: 5
Triglycerides: 90 mg/dL (ref 0.0–149.0)

## 2013-02-24 LAB — TSH: TSH: 1.39 u[IU]/mL (ref 0.35–5.50)

## 2013-02-24 LAB — CBC WITH DIFFERENTIAL/PLATELET
Basophils Absolute: 0 10*3/uL (ref 0.0–0.1)
HCT: 46.4 % (ref 39.0–52.0)
Hemoglobin: 15.4 g/dL (ref 13.0–17.0)
Lymphs Abs: 2.9 10*3/uL (ref 0.7–4.0)
MCHC: 33.2 g/dL (ref 30.0–36.0)
MCV: 82.9 fl (ref 78.0–100.0)
Monocytes Absolute: 0.6 10*3/uL (ref 0.1–1.0)
Neutro Abs: 3.2 10*3/uL (ref 1.4–7.7)
Platelets: 299 10*3/uL (ref 150.0–400.0)
RDW: 14.1 % (ref 11.5–14.6)

## 2013-02-24 LAB — POCT URINALYSIS DIPSTICK
Bilirubin, UA: NEGATIVE
Blood, UA: NEGATIVE
Glucose, UA: NEGATIVE
Leukocytes, UA: NEGATIVE
Nitrite, UA: NEGATIVE
Urobilinogen, UA: 0.2
pH, UA: 6.5

## 2013-02-24 LAB — HEPATIC FUNCTION PANEL
AST: 14 U/L (ref 0–37)
Albumin: 3.8 g/dL (ref 3.5–5.2)
Total Bilirubin: 0.6 mg/dL (ref 0.3–1.2)

## 2013-02-24 NOTE — Progress Notes (Signed)
Quick Note:  Pt has appointment on 03/03/13 will go over then. ______

## 2013-03-03 ENCOUNTER — Encounter: Payer: Self-pay | Admitting: Family Medicine

## 2013-03-03 ENCOUNTER — Ambulatory Visit (INDEPENDENT_AMBULATORY_CARE_PROVIDER_SITE_OTHER): Payer: 59 | Admitting: Family Medicine

## 2013-03-03 VITALS — BP 140/90 | HR 79 | Temp 98.7°F | Ht 67.75 in | Wt 292.0 lb

## 2013-03-03 DIAGNOSIS — I10 Essential (primary) hypertension: Secondary | ICD-10-CM

## 2013-03-03 DIAGNOSIS — Z0000 Encounter for general adult medical examination without abnormal findings: Secondary | ICD-10-CM

## 2013-03-03 LAB — PSA: PSA: 0.2 ng/mL (ref 0.10–4.00)

## 2013-03-03 MED ORDER — ALBUTEROL SULFATE HFA 108 (90 BASE) MCG/ACT IN AERS: 2.0000 | INHALATION_SPRAY | RESPIRATORY_TRACT | Status: DC | PRN

## 2013-03-03 MED ORDER — RABEPRAZOLE SODIUM 20 MG PO TBEC: 20.0000 mg | DELAYED_RELEASE_TABLET | Freq: Every day | ORAL | Status: DC

## 2013-03-03 MED ORDER — LOSARTAN POTASSIUM 50 MG PO TABS: 50.0000 mg | ORAL_TABLET | Freq: Every day | ORAL | Status: DC

## 2013-03-03 NOTE — Progress Notes (Signed)
Subjective:    Patient ID: Daniel [name], male    DOB: 1971-04-06, 42 y.o.   MRN: [id number]  HPI 42 yr old male for a cpx. He has a few issues to discuss. He knows his HTN is not well controlled, and he admits to not taking his BP med for over a year. His BP at home is usually in the 140s to 150s systolic. He had taken Losartan HCT in the mornings for about one month and it worked well, however it made him very tired all the time. He had gained weight up around 317, but now he is eating better and has lost some of this back again. He had used Aciphex periodically but he noticed frequent chest pressure last year which was not related to exertion. No SOB or nausea. He thought it was from GERD so he started taking the Aciphrx daily. This has worked well and he has no more chest discomfort. He also mentions waking up with his left hand numb at times. No swelling or pain. When he gets up this resolves in about 5 minutes.    Review of Systems  Constitutional: Negative.   HENT: Negative.   Eyes: Negative.   Respiratory: Negative.   Cardiovascular: Negative.   Gastrointestinal: Negative.   Genitourinary: Negative.   Musculoskeletal: Negative.   Skin: Negative.   Neurological: Positive for numbness. Negative for dizziness, tremors, seizures, syncope, facial asymmetry, speech difficulty, weakness, light-headedness and headaches.  Psychiatric/Behavioral: Negative.        Objective:   Physical Exam  Constitutional: He is oriented to person, place, and time. He appears well-developed and well-nourished. No distress.  HENT:  Head: Normocephalic and atraumatic.  Right Ear: External ear normal.  Left Ear: External ear normal.  Nose: Nose normal.  Mouth/Throat: Oropharynx is clear and moist. No oropharyngeal exudate.  Eyes: Conjunctivae and EOM are normal. Pupils are equal, round, and reactive to light. Right eye exhibits no discharge. Left eye exhibits no discharge. No scleral icterus.  Neck: Neck  supple. No JVD present. No tracheal deviation present. No thyromegaly present.  Cardiovascular: Normal rate, regular rhythm, normal heart sounds and intact distal pulses.  Exam reveals no gallop and no friction rub.   No murmur heard. Pulmonary/Chest: Effort normal and breath sounds normal. No respiratory distress. He has no wheezes. He has no rales. He exhibits no tenderness.  Abdominal: Soft. Bowel sounds are normal. He exhibits no distension and no mass. There is no tenderness. There is no rebound and no guarding.  Genitourinary: Rectum normal, prostate normal and penis normal. Guaiac negative stool. No penile tenderness.  Musculoskeletal: Normal range of motion. He exhibits no edema and no tenderness.  Lymphadenopathy:    He has no cervical adenopathy.  Neurological: He is alert and oriented to person, place, and time. He has normal reflexes. No cranial nerve deficit. He exhibits normal muscle tone. Coordination normal.  Skin: Skin is warm and dry. No rash noted. He is not diaphoretic. No erythema. No pallor.  Psychiatric: He has a normal mood and affect. His behavior is normal. Judgment and thought content normal.          Assessment & Plan:  Well exam. We agreed he needs to lose more weight. He needs to stay on BP meds and he understands. Try Losartan without the HCTZ, and he will take this at night. He has GERD and he will stay on daily Aciphex. He seems to have some carpal tunnel symptoms, and he will wear  a wrist brace to bed every night.

## 2013-03-04 NOTE — Progress Notes (Signed)
Quick Note:  I spoke with pt ______ 

## 2013-03-15 ENCOUNTER — Telehealth: Payer: Self-pay | Admitting: Family Medicine

## 2013-03-15 NOTE — Telephone Encounter (Signed)
Patient's prior auth for RABEprazole (ACIPHEX) 20 MG tablet has been denied. Per insurance, patient must try a preferred generic in the same class, or Nexium. Please advise. [facility] on [address] in [place].

## 2013-03-15 NOTE — Telephone Encounter (Signed)
Cancel the Aciphex. Instead cal in Nexium 40 mg a day for one year

## 2013-03-16 MED ORDER — ESOMEPRAZOLE MAGNESIUM 40 MG PO CPDR: 40.0000 mg | DELAYED_RELEASE_CAPSULE | Freq: Every day | ORAL | Status: DC

## 2013-03-16 NOTE — Telephone Encounter (Signed)
Called in Nexium. Called aciphex.

## 2013-12-22 ENCOUNTER — Encounter: Payer: Self-pay | Admitting: Family Medicine

## 2013-12-22 ENCOUNTER — Ambulatory Visit (INDEPENDENT_AMBULATORY_CARE_PROVIDER_SITE_OTHER): Payer: 59 | Admitting: Family Medicine

## 2013-12-22 VITALS — BP 136/78 | HR 94 | Temp 98.2°F | Wt 298.0 lb

## 2013-12-22 DIAGNOSIS — R55 Syncope and collapse: Secondary | ICD-10-CM

## 2013-12-22 NOTE — Progress Notes (Signed)
   Subjective:    Patient ID: Daniel [name], male    DOB: [birth year], 43 y.o.   MRN: [id number]  HPI Patient seen following seen following a syncopal episode. He relates having some head congestion and low-grade fever over the weekend. Earlier today and last night he had some diarrhea. No nausea or vomiting. This morning around 6 AM he was sitting on toilet having a stool and also had some coughing and felt very lightheaded and dizzy. He subsequently had a syncopal episode of unknown duration. In the process of falling off, he bumped his head. He has no headache at this time. No nausea or vomiting. No confusion. No seizure. No chest pains. No palpitations. He actually felt better afterwards and went on to work.  He has not had any persistent diarrhea symptoms through the day. Denies any orthostatic symptoms. He has some soreness right frontal forehead from fall but no headache at this time.  Hypertension treated with losartan but no orthostasis  Past Medical History  Diagnosis Date  . Asthma   . GERD (gastroesophageal reflux disease)    No past surgical history on file.  reports that he has never smoked. He has never used smokeless tobacco. He reports that he does not drink alcohol or use illicit drugs. family history includes Hyperlipidemia in an other family member; Hypertension in an other family member; Stroke in an other family member. Allergies  Allergen Reactions  . Bee Venom Anaphylaxis  . Shellfish Allergy Anaphylaxis      Review of Systems  Constitutional: Negative for fever, chills, appetite change and unexpected weight change.  HENT: Negative for sore throat.   Eyes: Negative for visual disturbance.  Cardiovascular: Negative for chest pain.  Gastrointestinal: Positive for diarrhea.  Endocrine: Negative for polydipsia and polyuria.  Genitourinary: Negative for dysuria.  Neurological: Positive for syncope. Negative for seizures, weakness and headaches.  Hematological:  Negative for adenopathy. Does not bruise/bleed easily.  Psychiatric/Behavioral: Negative for confusion.       Objective:   Physical Exam  Constitutional: He is oriented to person, place, and time. He appears well-developed and well-nourished.  HENT:  Right Ear: External ear normal.  Left Ear: External ear normal.  Patient has small contusion right frontal forhead and very small superficial abrasion left eyebrow region.  Eyes: Pupils are equal, round, and reactive to light.  Neck: Neck supple.  Cardiovascular: Normal rate and regular rhythm.   Pulmonary/Chest: Effort normal and breath sounds normal. No respiratory distress. He has no wheezes. He has no rales.  Musculoskeletal: He exhibits no edema.  Neurological: He is alert and oriented to person, place, and time. No cranial nerve deficit. He exhibits normal muscle tone. Coordination normal.  Psychiatric: He has a normal mood and affect. His behavior is normal. Judgment and thought content normal.          Assessment & Plan:  Transient syncopal episode this morning. This occurred in the setting of sitting on toilet while coughing. Very likely vasovagal syncope. He has not had any dehydration issues. EKG shows sinus rhythm with no acute findings. Clinically, he is not anemic. He has no orthostasis. This occurred in the setting of recent viral illness. He has not had any severe cough today, nor any fever. Would recommend observation and stay well-hydrated. Head injury sheet given. Followup promptly for any confusion, progressive headaches, vomiting, or any focal neurologic symptoms

## 2013-12-22 NOTE — Progress Notes (Signed)
Pre visit review using our clinic review tool, if applicable. No additional management support is needed unless otherwise documented below in the visit note. 

## 2013-12-22 NOTE — Patient Instructions (Addendum)
Vasovagal Syncope, Adult Syncope, commonly known as fainting, is a temporary loss of consciousness. It occurs when the blood flow to the brain is reduced. Vasovagal syncope (also called neurocardiogenic syncope) is a fainting spell in which the blood flow to the brain is reduced because of a sudden drop in heart rate and blood pressure. Vasovagal syncope occurs when the brain and the cardiovascular system (blood vessels) do not adequately communicate and respond to each other. This is the most common cause of fainting. It often occurs in response to fear or some other type of emotional or physical stress. The body has a reaction in which the heart starts beating too slowly or the blood vessels expand, reducing blood pressure. This type of fainting spell is generally considered harmless. However, injuries can occur if a person takes a sudden fall during a fainting spell.  CAUSES  Vasovagal syncope occurs when a person's blood pressure and heart rate decrease suddenly, usually in response to a trigger. Many things and situations can trigger an episode. Some of these include:   Pain.   Fear.   The sight of blood or medical procedures, such as blood being drawn from a vein.   Common activities, such as coughing, swallowing, stretching, or going to the bathroom.   Emotional stress.   Prolonged standing, especially in a warm environment.   Lack of sleep or rest.   Prolonged lack of food.   Prolonged lack of fluids.   Recent illness.  The use of certain drugs that affect blood pressure, such as cocaine, alcohol, marijuana, inhalants, and opiates.  SYMPTOMS  Before the fainting episode, you may:   Feel dizzy or light headed.   Become pale.  Sense that you are going to faint.   Feel like the room is spinning.   Have tunnel vision, only seeing directly in front of you.   Feel sick to your stomach (nauseous).   See spots or slowly lose vision.   Hear ringing in your  ears.   Have a headache.   Feel warm and sweaty.   Feel a sensation of pins and needles. During the fainting spell, you will generally be unconscious for no longer than a couple minutes before waking up and returning to normal. If you get up too quickly before your body can recover, you may faint again. Some twitching or jerky movements may occur during the fainting spell.  DIAGNOSIS  Your caregiver will ask about your symptoms, take a medical history, and perform a physical exam. Various tests may be done to rule out other causes of fainting. These may include blood tests and tests to check the heart, such as electrocardiography, echocardiography, and possibly an electrophysiology study. When other causes have been ruled out, a test may be done to check the body's response to changes in position (tilt table test). TREATMENT  Most cases of vasovagal syncope do not require treatment. Your caregiver may recommend ways to avoid fainting triggers and may provide home strategies for preventing fainting. If you must be exposed to a possible trigger, you can drink additional fluids to help reduce your chances of having an episode of vasovagal syncope. If you have warning signs of an oncoming episode, you can respond by positioning yourself favorably (lying down). If your fainting spells continue, you may be given medicines to prevent fainting. Some medicines may help make you more resistant to repeated episodes of vasovagal syncope. Special exercises or compression stockings may be recommended. In rare cases, the surgical placement  of a pacemaker is considered. HOME CARE INSTRUCTIONS   Learn to identify the warning signs of vasovagal syncope.   Sit or lie down at the first warning sign of a fainting spell. If sitting, put your head down between your legs. If you lie down, swing your legs up in the air to increase blood flow to the brain.   Avoid hot tubs and saunas.  Avoid prolonged  standing.  Drink enough fluids to keep your urine clear or pale yellow. Avoid caffeine.  Increase salt in your diet as directed by your caregiver.   If you have to stand for a long time, perform movements such as:   Crossing your legs.   Flexing and stretching your leg muscles.   Squatting.   Moving your legs.   Bending over.   Only take over-the-counter or prescription medicines as directed by your caregiver. Do not suddenly stop any medicines without asking your caregiver first. SEEK MEDICAL CARE IF:   Your fainting spells continue or happen more frequently in spite of treatment.   You lose consciousness for more than a couple minutes.  You have fainting spells during or after exercising or after being startled.   You have new symptoms that occur with the fainting spells, such as:   Shortness of breath.  Chest pain.   Irregular heartbeat.   You have episodes of twitching or jerky movements that last longer than a few seconds.  You have episodes of twitching or jerky movements without obvious fainting. SEEK IMMEDIATE MEDICAL CARE IF:   You have injuries or bleeding after a fainting spell.   You have episodes of twitching or jerky movements that last longer than 5 minutes.   You have more than one spell of twitching or jerky movements before returning to consciousness after fainting. MAKE SURE YOU:   Understand these instructions.  Will watch your condition.  Will get help right away if you are not doing well or get worse. Document Released: 10/28/2012 Document Reviewed: 10/28/2012 [facility] Patient Information 2014 [place], [place]. Head Injury, Adult You have received a head injury. It does not appear serious at this time. Headaches and vomiting are common following head injury. It should be easy to awaken from sleeping. Sometimes it is necessary for you to stay in the emergency department for a while for observation. Sometimes admission to the  hospital may be needed. After injuries such as yours, most problems occur within the first 24 hours, but side effects may occur up to 7 10 days after the injury. It is important for you to carefully monitor your condition and contact your health care provider or seek immediate medical care if there is a change in your condition. WHAT ARE THE TYPES OF HEAD INJURIES? Head injuries can be as minor as a bump. Some head injuries can be more severe. More severe head injuries include:  A jarring injury to the brain (concussion).  A bruise of the brain (contusion). This mean there is bleeding in the brain that can cause swelling.  A cracked skull (skull fracture).  Bleeding in the brain that collects, clots, and forms a bump (hematoma). WHAT CAUSES A HEAD INJURY? A serious head injury is most likely to happen to someone who is in a car wreck and is not wearing a seat belt. Other causes of major head injuries include bicycle or motorcycle accidents, sports injuries, and falls. HOW ARE HEAD INJURIES DIAGNOSED? A complete history of the event leading to the injury and your current  symptoms will be helpful in diagnosing head injuries. Many times, pictures of the brain, such as CT or MRI are needed to see the extent of the injury. Often, an overnight hospital stay is necessary for observation.  WHEN SHOULD I SEEK IMMEDIATE MEDICAL CARE?  You should get help right away if:  You have confusion or drowsiness.  You feel sick to your stomach (nauseous) or have continued, forceful vomiting.  You have dizziness or unsteadiness that is getting worse.  You have severe, continued headaches not relieved by medicine. Only take over-the-counter or prescription medicines for pain, fever, or discomfort as directed by your health care provider.  You do not have normal function of the arms or legs or are unable to walk.  You notice changes in the black spots in the center of the colored part of your eye  (pupil).  You have a clear or bloody fluid coming from your nose or ears.  You have a loss of vision. During the next 24 hours after the injury, you must stay with someone who can watch you for the warning signs. This person should contact local emergency services (911 in the [place] if you have seizures, you become unconscious, or you are unable to wake up. HOW CAN I PREVENT A HEAD INJURY IN THE FUTURE? The most important factor for preventing major head injuries is avoiding motor vehicle accidents. To minimize the potential for damage to your head, it is crucial to wear seat belts while riding in motor vehicles. Wearing helmets while bike riding and playing collision sports (like football) is also helpful. Also, avoiding dangerous activities around the house will further help reduce your risk of head injury.  WHEN CAN I RETURN TO NORMAL ACTIVITIES AND ATHLETICS? You should be reevaluated by your health care provider before returning to these activities. If you have any of the following symptoms, you should not return to activities or contact sports until 1 week after the symptoms have stopped:  Persistent headache.  Dizziness or vertigo.  Poor attention and concentration.  Confusion.  Memory problems.  Nausea or vomiting.  Fatigue or tire easily.  Irritability.  Intolerant of bright lights or loud noises.  Anxiety or depression.  Disturbed sleep. MAKE SURE YOU:   Understand these instructions.  Will watch your condition.  Will get help right away if you are not doing well or get worse. Document Released: 11/11/2005 Document Revised: 09/01/2013 Document Reviewed: 07/19/2013 [facility] Patient Information 2014 [place], [place].

## 2014-03-02 ENCOUNTER — Other Ambulatory Visit (INDEPENDENT_AMBULATORY_CARE_PROVIDER_SITE_OTHER): Payer: 59

## 2014-03-02 DIAGNOSIS — Z0000 Encounter for general adult medical examination without abnormal findings: Secondary | ICD-10-CM

## 2014-03-02 LAB — POCT URINALYSIS DIPSTICK
BILIRUBIN UA: NEGATIVE
Blood, UA: NEGATIVE
Glucose, UA: NEGATIVE
KETONES UA: NEGATIVE
Leukocytes, UA: NEGATIVE
Nitrite, UA: NEGATIVE
PH UA: 6.5
PROTEIN UA: NEGATIVE
SPEC GRAV UA: 1.015
Urobilinogen, UA: 0.2

## 2014-03-02 LAB — BASIC METABOLIC PANEL
BUN: 18 mg/dL (ref 6–23)
CO2: 24 mEq/L (ref 19–32)
Calcium: 9.3 mg/dL (ref 8.4–10.5)
Chloride: 108 mEq/L (ref 96–112)
Creatinine, Ser: 1.2 mg/dL (ref 0.4–1.5)
GFR: 86.75 mL/min (ref >60.00)
Glucose, Bld: 85 mg/dL (ref 70–99)
POTASSIUM: 4.2 meq/L (ref 3.5–5.1)
SODIUM: 139 meq/L (ref 135–145)

## 2014-03-02 LAB — HEPATIC FUNCTION PANEL
ALBUMIN: 3.8 g/dL (ref 3.5–5.2)
ALK PHOS: 74 U/L (ref 39–117)
ALT: 26 U/L (ref 0–53)
AST: 17 U/L (ref 0–37)
BILIRUBIN DIRECT: 0 mg/dL (ref 0.0–0.3)
TOTAL PROTEIN: 7.4 g/dL (ref 6.0–8.3)
Total Bilirubin: 0.3 mg/dL (ref 0.3–1.2)

## 2014-03-02 LAB — LIPID PANEL
CHOLESTEROL: 175 mg/dL (ref 0–200)
HDL: 38.3 mg/dL — AB (ref >39.00)
LDL Cholesterol: 118 mg/dL — ABNORMAL HIGH (ref 0–99)
TRIGLYCERIDES: 92 mg/dL (ref 0.0–149.0)
Total CHOL/HDL Ratio: 5
VLDL: 18.4 mg/dL (ref 0.0–40.0)

## 2014-03-02 LAB — CBC WITH DIFFERENTIAL/PLATELET
BASOS ABS: 0 10*3/uL (ref 0.0–0.1)
Basophils Relative: 0.4 % (ref 0.0–3.0)
EOS ABS: 0.2 10*3/uL (ref 0.0–0.7)
EOS PCT: 1.9 % (ref 0.0–5.0)
HCT: 43.4 % (ref 39.0–52.0)
Hemoglobin: 14.5 g/dL (ref 13.0–17.0)
Lymphocytes Relative: 33.3 % (ref 12.0–46.0)
Lymphs Abs: 3 10*3/uL (ref 0.7–4.0)
MCHC: 33.5 g/dL (ref 30.0–36.0)
MCV: 84.8 fl (ref 78.0–100.0)
MONO ABS: 0.6 10*3/uL (ref 0.1–1.0)
Monocytes Relative: 6.2 % (ref 3.0–12.0)
NEUTROS PCT: 58.2 % (ref 43.0–77.0)
Neutro Abs: 5.2 10*3/uL (ref 1.4–7.7)
PLATELETS: 317 10*3/uL (ref 150.0–400.0)
RBC: 5.12 Mil/uL (ref 4.22–5.81)
RDW: 14.6 % (ref 11.5–14.6)
WBC: 8.9 10*3/uL (ref 4.5–10.5)

## 2014-03-02 LAB — PSA: PSA: 0.2 ng/mL (ref 0.10–4.00)

## 2014-03-02 LAB — TSH: TSH: 1.58 u[IU]/mL (ref 0.35–5.50)

## 2014-03-07 ENCOUNTER — Ambulatory Visit (INDEPENDENT_AMBULATORY_CARE_PROVIDER_SITE_OTHER): Payer: 59 | Admitting: Family Medicine

## 2014-03-07 ENCOUNTER — Encounter: Payer: Self-pay | Admitting: Family Medicine

## 2014-03-07 VITALS — BP 124/80 | HR 87 | Temp 99.2°F | Ht 67.75 in | Wt 298.0 lb

## 2014-03-07 DIAGNOSIS — Z0000 Encounter for general adult medical examination without abnormal findings: Secondary | ICD-10-CM

## 2014-03-07 MED ORDER — EPINEPHRINE 0.3 MG/0.3ML IJ SOAJ: 0.3000 mg | Freq: Once | INTRAMUSCULAR | Status: AC

## 2014-03-07 NOTE — Progress Notes (Signed)
   Subjective:    Patient ID: Daniel [name], male    DOB: [birth year], 43 y.o.   MRN: [id number]  HPI 43 yr old male for a cpx. He feels fine. He has been working out 5 days a week and has lost 2 pants sizes. His BP at home is steady in the 110s or 120s systolic. He asks if he can stop the BP pill.   Review of Systems  Constitutional: Negative.   HENT: Negative.   Eyes: Negative.   Respiratory: Negative.   Cardiovascular: Negative.   Gastrointestinal: Negative.   Genitourinary: Negative.   Musculoskeletal: Negative.   Skin: Negative.   Neurological: Negative.   Psychiatric/Behavioral: Negative.        Objective:   Physical Exam  Constitutional: He is oriented to person, place, and time. He appears well-developed and well-nourished. No distress.  HENT:  Head: Normocephalic and atraumatic.  Right Ear: External ear normal.  Left Ear: External ear normal.  Nose: Nose normal.  Mouth/Throat: Oropharynx is clear and moist. No oropharyngeal exudate.  Eyes: Conjunctivae and EOM are normal. Pupils are equal, round, and reactive to light. Right eye exhibits no discharge. Left eye exhibits no discharge. No scleral icterus.  Neck: Neck supple. No JVD present. No tracheal deviation present. No thyromegaly present.  Cardiovascular: Normal rate, regular rhythm, normal heart sounds and intact distal pulses.  Exam reveals no gallop and no friction rub.   No murmur heard. Pulmonary/Chest: Effort normal and breath sounds normal. No respiratory distress. He has no wheezes. He has no rales. He exhibits no tenderness.  Abdominal: Soft. Bowel sounds are normal. He exhibits no distension and no mass. There is no tenderness. There is no rebound and no guarding.  Genitourinary: Rectum normal, prostate normal and penis normal. Guaiac negative stool. No penile tenderness.  Musculoskeletal: Normal range of motion. He exhibits no edema and no tenderness.  Lymphadenopathy:    He has no cervical adenopathy.    Neurological: He is alert and oriented to person, place, and time. He has normal reflexes. No cranial nerve deficit. He exhibits normal muscle tone. Coordination normal.  Skin: Skin is warm and dry. No rash noted. He is not diaphoretic. No erythema. No pallor.  Psychiatric: He has a normal mood and affect. His behavior is normal. Judgment and thought content normal.          Assessment & Plan:  Well exam. We will stop the Cozaar and he will follow the BP at home.

## 2014-03-07 NOTE — Progress Notes (Signed)
Pre visit review using our clinic review tool, if applicable. No additional management support is needed unless otherwise documented below in the visit note. 

## 2014-05-19 ENCOUNTER — Ambulatory Visit (INDEPENDENT_AMBULATORY_CARE_PROVIDER_SITE_OTHER): Payer: 59 | Admitting: Family Medicine

## 2014-05-19 ENCOUNTER — Encounter: Payer: Self-pay | Admitting: Family Medicine

## 2014-05-19 VITALS — BP 130/88 | HR 98 | Temp 98.6°F | Ht 67.75 in | Wt 299.0 lb

## 2014-05-19 DIAGNOSIS — J3089 Other allergic rhinitis: Secondary | ICD-10-CM

## 2014-05-19 DIAGNOSIS — J302 Other seasonal allergic rhinitis: Secondary | ICD-10-CM

## 2014-05-19 NOTE — Progress Notes (Signed)
No chief complaint on file.   HPI:  Acute visit for:  1) dark circles under eyes: -reports: started about 2 weeks ago - had sty 2 weeks ago, has bad allergies (nasal and ocular) and seeing allergist - taking two medications for this -denies: fevers, visual changes, sinus pain, malaise, swelling anywhere else -had recent physical and labs with PCP with PCP, reviewed, normal CBC, CMP, TSH, PSA  ROS: See pertinent positives and negatives per HPI.  Past Medical History  Diagnosis Date  . Asthma   . GERD (gastroesophageal reflux disease)     No past surgical history on file.  Family History  Problem Relation Age of Onset  . Hyperlipidemia    . Hypertension    . Stroke      History   Social History  . Marital Status: Married    Spouse Name: N/A    Number of Children: N/A  . Years of Education: N/A   Social History Main Topics  . Smoking status: Never Smoker   . Smokeless tobacco: Never Used  . Alcohol Use: No  . Drug Use: No  . Sexual Activity: Yes    Birth Control/ Protection: None   Other Topics Concern  . None   Social History Narrative   Married    Current outpatient prescriptions:albuterol (VENTOLIN HFA) 108 (90 BASE) MCG/ACT inhaler, Inhale 2 puffs into the lungs every 4 (four) hours as needed for wheezing or shortness of breath., Disp: 1 Inhaler, Rfl: 11;  EPINEPHrine (EPIPEN 2-PAK) 0.3 mg/0.3 mL SOAJ injection, Inject 0.3 mLs (0.3 mg total) into the muscle once., Disp: 1 Device, Rfl: 11 esomeprazole (NEXIUM) 40 MG capsule, Take 1 capsule (40 mg total) by mouth daily., Disp: 30 capsule, Rfl: 11;  Multiple Vitamin (MULTIVITAMIN WITH MINERALS) TABS, Take 1 tablet by mouth daily., Disp: , Rfl: ;  Polyvinyl Alcohol-Povidone (CLEAR EYES ALL SEASONS) 5-6 MG/ML SOLN, Apply 1 drop to eye once as needed. For dryness, Disp: , Rfl:   EXAM:  Filed Vitals:   05/19/14 1432  BP: 130/88  Pulse: 98  Temp: 98.6 F (37 C)    Body mass index is 45.79  kg/(m^2).  GENERAL: vitals reviewed and listed above, alert, oriented, appears well hydrated and in no acute distress  HEENT: atraumatic, conjunttiva clear, visual acuity grossly intact, no obvious abnormalities on inspection of external nose and ears, allergic shiners, mild, normal appearance of ear canals and TMs, clear nasal congestion, mild post oropharyngeal erythema with PND, no tonsillar edema or exudate, no sinus TTP  NECK: no obvious masses on inspection  LUNGS: clear to auscultation bilaterally, no wheezes, rales or rhonchi, good air movement  CV: HRRR, no peripheral edema  MS: moves all extremities without noticeable abnormality  PSYCH: pleasant and cooperative, no obvious depression or anxiety  ASSESSMENT AND PLAN:  Discussed the following assessment and plan:  Other seasonal allergic rhinitis  -Patient advised to return or notify a doctor immediately if symptoms worsen or persist or new concerns arise.  Patient Instructions  -nasocort daily for one month along with your other allergy medications  -follow up with your allergist and/or eye doctor if persists        [name], HANNAH R.

## 2014-05-19 NOTE — Patient Instructions (Addendum)
-  nasocort daily for one month along with your other allergy medications  -follow up with your allergist and/or eye doctor if persists

## 2014-05-19 NOTE — Progress Notes (Signed)
Pre visit review using our clinic review tool, if applicable. No additional management support is needed unless otherwise documented below in the visit note. 

## 2014-07-19 ENCOUNTER — Telehealth: Payer: Self-pay | Admitting: Family Medicine

## 2014-07-19 NOTE — Telephone Encounter (Signed)
Patient Information:  Caller Name: Lalo  Phone: [phone]  Patient: Daniel [name], Daniel [name]  Gender: Male  DOB: [birth year]  Age: 43 Years  PCP: [name] Crane [facility]  Office Follow Up:  Does the office need to follow up with this patient?: No  Instructions For The Office: N/A  RN Note:  Pt was stung by a yellow jacket on his top lip at 1515 today. He is concerned as he had 20 yellow jacket stings in 2014 which required ED care. Breathing easy, skin cool and dry, no abdominal pain.  Symptoms  Reason For Call & Symptoms: Yellow jacket sting  Reviewed Health History In EMR: Yes  Reviewed Medications In EMR: Yes  Reviewed Allergies In EMR: Yes  Reviewed Surgeries / Procedures: Yes  Date of Onset of Symptoms: 07/19/2014  Treatments Tried: Ice, Benadryl   Treatments Tried Worked: Yes  Guideline(s) Used:  Warden/ranger  Disposition Per Guideline:   Home Care  Reason For Disposition Reached:   Normal local reaction to bee, wasp, or yellow jacket sting  Advice Given:  Pain Medicines:  For pain relief, you can take either acetaminophen, ibuprofen, or naproxen.  Call Back If:  Difficulty breathing or swallowing (generally develops within the first 2 hours after the sting; call 911)  Swelling becomes huge  Sting begins to look infected  You become worse.  Patient Will Follow Care Advice:  YES

## 2014-11-22 ENCOUNTER — Encounter: Payer: Self-pay | Admitting: Family Medicine

## 2014-11-22 MED ORDER — ALBUTEROL SULFATE HFA 108 (90 BASE) MCG/ACT IN AERS: 2.0000 | INHALATION_SPRAY | RESPIRATORY_TRACT | Status: DC | PRN

## 2014-12-13 ENCOUNTER — Telehealth: Payer: Self-pay | Admitting: Family Medicine

## 2014-12-13 ENCOUNTER — Encounter: Payer: Self-pay | Admitting: Family Medicine

## 2014-12-13 NOTE — Telephone Encounter (Signed)
I updated pt's medication chart from a my chart encounter.

## 2014-12-13 NOTE — Telephone Encounter (Signed)
I updated chart with these medications. Can we refill these for pt?

## 2014-12-14 MED ORDER — LEVOCETIRIZINE DIHYDROCHLORIDE 5 MG PO TABS: 5.0000 mg | ORAL_TABLET | Freq: Every evening | ORAL | Status: DC

## 2014-12-14 MED ORDER — MONTELUKAST SODIUM 10 MG PO TABS: 10.0000 mg | ORAL_TABLET | Freq: Every day | ORAL | Status: DC

## 2014-12-14 NOTE — Telephone Encounter (Signed)
done

## 2015-02-03 ENCOUNTER — Encounter: Payer: Self-pay | Admitting: Family Medicine

## 2015-02-03 ENCOUNTER — Ambulatory Visit (INDEPENDENT_AMBULATORY_CARE_PROVIDER_SITE_OTHER): Payer: 59 | Admitting: Family Medicine

## 2015-02-03 VITALS — BP 131/81 | HR 78 | Temp 98.9°F | Ht 67.75 in | Wt 303.0 lb

## 2015-02-03 DIAGNOSIS — K5900 Constipation, unspecified: Secondary | ICD-10-CM | POA: Insufficient documentation

## 2015-02-03 NOTE — Progress Notes (Signed)
   Subjective:    Patient ID: Marchia Bondracey M Brandt, male    DOB: 10-08-1971, 44 y.o.   MRN: [id number]  HPI Here for a month of abdominal bloating, belching, and some constipation. He is having a BM every other day when he is used to going daily. No fever. No nausea. He is taking Nexium daily.    Review of Systems  Constitutional: Negative.   Respiratory: Negative.   Cardiovascular: Negative.   Gastrointestinal: Positive for abdominal distention. Negative for nausea, vomiting, abdominal pain, diarrhea, blood in stool, anal bleeding and rectal pain.       Objective:   Physical Exam  Constitutional: He appears well-developed and well-nourished.  Cardiovascular: Normal rate, regular rhythm, normal heart sounds and intact distal pulses.   Pulmonary/Chest: Effort normal and breath sounds normal.  Abdominal: Soft. Bowel sounds are normal. He exhibits no distension and no mass. There is no tenderness. There is no rebound and no guarding.          Assessment & Plan:  This sounds like simple constipation. Try Align and Miralax daily. Recheck prn

## 2015-02-03 NOTE — Progress Notes (Signed)
Pre visit review using our clinic review tool, if applicable. No additional management support is needed unless otherwise documented below in the visit note. 

## 2015-03-17 ENCOUNTER — Other Ambulatory Visit (INDEPENDENT_AMBULATORY_CARE_PROVIDER_SITE_OTHER): Payer: 59

## 2015-03-17 DIAGNOSIS — Z0000 Encounter for general adult medical examination without abnormal findings: Secondary | ICD-10-CM

## 2015-03-17 LAB — CBC WITH DIFFERENTIAL/PLATELET
BASOS ABS: 0.1 10*3/uL (ref 0.0–0.1)
BASOS PCT: 0.7 % (ref 0.0–3.0)
Eosinophils Absolute: 0.3 10*3/uL (ref 0.0–0.7)
Eosinophils Relative: 4.3 % (ref 0.0–5.0)
HCT: 44.7 % (ref 39.0–52.0)
Hemoglobin: 15.1 g/dL (ref 13.0–17.0)
Lymphocytes Relative: 34.2 % (ref 12.0–46.0)
Lymphs Abs: 2.7 10*3/uL (ref 0.7–4.0)
MCHC: 33.7 g/dL (ref 30.0–36.0)
MCV: 81.9 fl (ref 78.0–100.0)
Monocytes Absolute: 0.7 10*3/uL (ref 0.1–1.0)
Monocytes Relative: 8.7 % (ref 3.0–12.0)
NEUTROS ABS: 4.1 10*3/uL (ref 1.4–7.7)
NEUTROS PCT: 52.1 % (ref 43.0–77.0)
Platelets: 286 10*3/uL (ref 150.0–400.0)
RBC: 5.46 Mil/uL (ref 4.22–5.81)
RDW: 14.4 % (ref 11.5–15.5)
WBC: 7.9 10*3/uL (ref 4.0–10.5)

## 2015-03-17 LAB — BASIC METABOLIC PANEL
BUN: 12 mg/dL (ref 6–23)
CHLORIDE: 106 meq/L (ref 96–112)
CO2: 27 meq/L (ref 19–32)
Calcium: 9.1 mg/dL (ref 8.4–10.5)
Creatinine, Ser: 1.11 mg/dL (ref 0.40–1.50)
GFR: 93.52 mL/min (ref >60.00)
Glucose, Bld: 88 mg/dL (ref 70–99)
POTASSIUM: 4.2 meq/L (ref 3.5–5.1)
Sodium: 138 mEq/L (ref 135–145)

## 2015-03-17 LAB — LIPID PANEL
CHOL/HDL RATIO: 5
CHOLESTEROL: 181 mg/dL (ref 0–200)
HDL: 35.3 mg/dL — ABNORMAL LOW (ref >39.00)
LDL CALC: 123 mg/dL — AB (ref 0–99)
NonHDL: 145.7
TRIGLYCERIDES: 114 mg/dL (ref 0.0–149.0)
VLDL: 22.8 mg/dL (ref 0.0–40.0)

## 2015-03-17 LAB — POCT URINALYSIS DIPSTICK
Bilirubin, UA: NEGATIVE
Blood, UA: NEGATIVE
Glucose, UA: NEGATIVE
KETONES UA: NEGATIVE
LEUKOCYTES UA: NEGATIVE
Nitrite, UA: NEGATIVE
PH UA: 7
PROTEIN UA: NEGATIVE
Spec Grav, UA: 1.015
Urobilinogen, UA: 1

## 2015-03-17 LAB — HEPATIC FUNCTION PANEL
ALBUMIN: 3.9 g/dL (ref 3.5–5.2)
ALK PHOS: 82 U/L (ref 39–117)
ALT: 21 U/L (ref 0–53)
AST: 14 U/L (ref 0–37)
Bilirubin, Direct: 0.1 mg/dL (ref 0.0–0.3)
Total Bilirubin: 0.3 mg/dL (ref 0.2–1.2)
Total Protein: 7.2 g/dL (ref 6.0–8.3)

## 2015-03-17 LAB — TSH: TSH: 1.39 u[IU]/mL (ref 0.35–4.50)

## 2015-03-17 LAB — PSA: PSA: 0.14 ng/mL (ref 0.10–4.00)

## 2015-03-22 ENCOUNTER — Encounter: Payer: Self-pay | Admitting: Family Medicine

## 2015-03-22 ENCOUNTER — Ambulatory Visit (INDEPENDENT_AMBULATORY_CARE_PROVIDER_SITE_OTHER): Payer: 59 | Admitting: Family Medicine

## 2015-03-22 VITALS — BP 133/73 | HR 84 | Temp 98.9°F | Ht 67.75 in | Wt 308.0 lb

## 2015-03-22 DIAGNOSIS — Z0000 Encounter for general adult medical examination without abnormal findings: Secondary | ICD-10-CM

## 2015-03-22 MED ORDER — ESOMEPRAZOLE MAGNESIUM 20 MG PO CPDR: 20.0000 mg | DELAYED_RELEASE_CAPSULE | Freq: Every day | ORAL | Status: DC

## 2015-03-22 NOTE — Progress Notes (Signed)
   Subjective:    Patient ID: Daniel [name], male    DOB: [birth year], 44 y.o.   MRN: [id number]  HPI 44 yr old male for a cpx. He feels well. We saw him last month for constipation and he started using Miralax and a probiotic for several weeks. This worked well and now he has a BM every day. He feels better and he noe uses the probiotic only. His asthma has been stable.    Review of Systems  Constitutional: Negative.   HENT: Negative.   Eyes: Negative.   Respiratory: Negative.   Cardiovascular: Negative.   Gastrointestinal: Negative.   Genitourinary: Negative.   Musculoskeletal: Negative.   Skin: Negative.   Neurological: Negative.   Psychiatric/Behavioral: Negative.        Objective:   Physical Exam  Constitutional: He is oriented to person, place, and time. He appears well-developed and well-nourished. No distress.  HENT:  Head: Normocephalic and atraumatic.  Right Ear: External ear normal.  Left Ear: External ear normal.  Nose: Nose normal.  Mouth/Throat: Oropharynx is clear and moist. No oropharyngeal exudate.  Eyes: Conjunctivae and EOM are normal. Pupils are equal, round, and reactive to light. Right eye exhibits no discharge. Left eye exhibits no discharge. No scleral icterus.  Neck: Neck supple. No JVD present. No tracheal deviation present. No thyromegaly present.  Cardiovascular: Normal rate, regular rhythm, normal heart sounds and intact distal pulses.  Exam reveals no gallop and no friction rub.   No murmur heard. Pulmonary/Chest: Effort normal and breath sounds normal. No respiratory distress. He has no wheezes. He has no rales. He exhibits no tenderness.  Abdominal: Soft. Bowel sounds are normal. He exhibits no distension and no mass. There is no tenderness. There is no rebound and no guarding.  Genitourinary: Rectum normal, prostate normal and penis normal. Guaiac negative stool. No penile tenderness.  Musculoskeletal: Normal range of motion. He exhibits no edema  or tenderness.  Lymphadenopathy:    He has no cervical adenopathy.  Neurological: He is alert and oriented to person, place, and time. He has normal reflexes. No cranial nerve deficit. He exhibits normal muscle tone. Coordination normal.  Skin: Skin is warm and dry. No rash noted. He is not diaphoretic. No erythema. No pallor.  Psychiatric: He has a normal mood and affect. His behavior is normal. Judgment and thought content normal.          Assessment & Plan:  Well exam.

## 2015-03-22 NOTE — Progress Notes (Signed)
Pre visit review using our clinic review tool, if applicable. No additional management support is needed unless otherwise documented below in the visit note. 

## 2015-09-15 ENCOUNTER — Other Ambulatory Visit: Payer: Self-pay | Admitting: Family Medicine

## 2015-09-18 ENCOUNTER — Telehealth: Payer: Self-pay | Admitting: Family Medicine

## 2015-09-18 MED ORDER — ESOMEPRAZOLE MAGNESIUM 20 MG PO CPDR: 20.0000 mg | DELAYED_RELEASE_CAPSULE | Freq: Every day | ORAL | Status: DC

## 2015-09-18 NOTE — Telephone Encounter (Signed)
Nexium is not covered by insurance, will cover omeprazole?

## 2015-09-18 NOTE — Addendum Note (Signed)
Addended by: Aniceto BossNIMMONS, Nevaen Tredway A on: 09/18/2015 01:42 PM   Modules accepted: Orders

## 2015-09-19 NOTE — Telephone Encounter (Signed)
Change from Nexium to Omeprazole 20 mg daily, fill for one year

## 2015-09-20 MED ORDER — OMEPRAZOLE 20 MG PO CPDR: 20.0000 mg | DELAYED_RELEASE_CAPSULE | Freq: Every day | ORAL | Status: DC

## 2015-09-20 NOTE — Telephone Encounter (Signed)
I sent new script e-scribe, took off Nexium from current list and left a voice message with this information.

## 2015-10-16 ENCOUNTER — Encounter: Payer: Self-pay | Admitting: Gastroenterology

## 2015-11-07 ENCOUNTER — Encounter: Payer: Self-pay | Admitting: Family Medicine

## 2015-11-07 ENCOUNTER — Ambulatory Visit (INDEPENDENT_AMBULATORY_CARE_PROVIDER_SITE_OTHER): Payer: 59 | Admitting: Family Medicine

## 2015-11-07 VITALS — BP 169/86 | HR 87 | Temp 98.7°F | Ht 67.75 in | Wt 312.0 lb

## 2015-11-07 DIAGNOSIS — R1012 Left upper quadrant pain: Secondary | ICD-10-CM

## 2015-11-07 LAB — CBC WITH DIFFERENTIAL/PLATELET
Basophils Absolute: 0 10*3/uL (ref 0.0–0.1)
Basophils Relative: 0.5 % (ref 0.0–3.0)
EOS PCT: 2.5 % (ref 0.0–5.0)
Eosinophils Absolute: 0.3 10*3/uL (ref 0.0–0.7)
HEMATOCRIT: 46.6 % (ref 39.0–52.0)
HEMOGLOBIN: 15.5 g/dL (ref 13.0–17.0)
LYMPHS PCT: 33.6 % (ref 12.0–46.0)
Lymphs Abs: 3.4 10*3/uL (ref 0.7–4.0)
MCHC: 33.3 g/dL (ref 30.0–36.0)
MCV: 82.2 fl (ref 78.0–100.0)
MONOS PCT: 8.7 % (ref 3.0–12.0)
Monocytes Absolute: 0.9 10*3/uL (ref 0.1–1.0)
Neutro Abs: 5.6 10*3/uL (ref 1.4–7.7)
Neutrophils Relative %: 54.7 % (ref 43.0–77.0)
Platelets: 300 10*3/uL (ref 150.0–400.0)
RBC: 5.67 Mil/uL (ref 4.22–5.81)
RDW: 14.1 % (ref 11.5–15.5)
WBC: 10.2 10*3/uL (ref 4.0–10.5)

## 2015-11-07 LAB — BASIC METABOLIC PANEL
BUN: 17 mg/dL (ref 6–23)
CALCIUM: 9.5 mg/dL (ref 8.4–10.5)
CO2: 26 mEq/L (ref 19–32)
CREATININE: 1.2 mg/dL (ref 0.40–1.50)
Chloride: 106 mEq/L (ref 96–112)
GFR: 85.21 mL/min (ref >60.00)
GLUCOSE: 103 mg/dL — AB (ref 70–99)
Potassium: 4.3 mEq/L (ref 3.5–5.1)
Sodium: 140 mEq/L (ref 135–145)

## 2015-11-07 LAB — HEPATIC FUNCTION PANEL
ALBUMIN: 4.1 g/dL (ref 3.5–5.2)
ALT: 18 U/L (ref 0–53)
AST: 13 U/L (ref 0–37)
Alkaline Phosphatase: 81 U/L (ref 39–117)
Bilirubin, Direct: 0.1 mg/dL (ref 0.0–0.3)
Total Bilirubin: 0.2 mg/dL (ref 0.2–1.2)
Total Protein: 7.3 g/dL (ref 6.0–8.3)

## 2015-11-07 LAB — LIPASE: Lipase: 14 U/L (ref 11.0–59.0)

## 2015-11-07 LAB — AMYLASE: AMYLASE: 43 U/L (ref 27–131)

## 2015-11-07 NOTE — Progress Notes (Signed)
   Subjective:    Patient ID: Daniel [name], male    DOB: Jun 02, 1971, 44 y.o.   MRN: [id number]  HPI Here for worsening LUQ pains. He has had some fo these pains off and on for 2 months, but they have become more frequent and more intense this past week. They are sharp in nature and they last from 10 to 30 minutes at a time. He gets nauseated at times but has not vomited. His appetite has decreased and he feels full after eating very small amounts. He is drinking plenty of water. No fever. He has been constipated, averaging one BM every other day. He normally has 2-3 BM a day. No urinary sx. He takes Omeprazole daily.    Review of Systems  Constitutional: Negative.   Respiratory: Negative.   Cardiovascular: Negative.   Gastrointestinal: Positive for nausea, abdominal pain and constipation. Negative for vomiting, diarrhea, blood in stool, abdominal distention, anal bleeding and rectal pain.  Genitourinary: Negative.        Objective:   Physical Exam  Constitutional: He is oriented to person, place, and time. He appears well-developed and well-nourished. No distress.  Eyes: No scleral icterus.  Neck: No thyromegaly present.  Cardiovascular: Normal rate, regular rhythm, normal heart sounds and intact distal pulses.   Pulmonary/Chest: Effort normal and breath sounds normal.  Abdominal: Soft. Bowel sounds are normal. He exhibits no distension and no mass. There is no rebound and no guarding.  He is moderately tender in the LUQ   Lymphadenopathy:    He has no cervical adenopathy.  Neurological: He is alert and oriented to person, place, and time.          Assessment & Plan:  LUQ, unclear etiology although he has been constipated. Doubt diverticulitis. He could have pancreatitis. We will get labs today and set up a CT scan soon. He is also scheduled to see Dr. Lavon PaganiniNandigam in the GI office on 12-19-15.

## 2015-11-07 NOTE — Progress Notes (Signed)
Pre visit review using our clinic review tool, if applicable. No additional management support is needed unless otherwise documented below in the visit note. 

## 2015-11-15 ENCOUNTER — Ambulatory Visit (INDEPENDENT_AMBULATORY_CARE_PROVIDER_SITE_OTHER)
Admission: RE | Admit: 2015-11-15 | Discharge: 2015-11-15 | Disposition: A | Discharge status: Home / Self Care | Payer: 59 | Source: Ambulatory Visit | Attending: Family Medicine | Admitting: Family Medicine

## 2015-11-15 DIAGNOSIS — R1012 Left upper quadrant pain: Secondary | ICD-10-CM

## 2015-11-15 MED ORDER — IOHEXOL 300 MG/ML  SOLN
100.0000 mL | Freq: Once | INTRAMUSCULAR | Status: AC | PRN
  Administered 2015-11-15: 100 mL via INTRAVENOUS

## 2015-12-19 ENCOUNTER — Encounter: Payer: Self-pay | Admitting: Gastroenterology

## 2015-12-19 ENCOUNTER — Ambulatory Visit (INDEPENDENT_AMBULATORY_CARE_PROVIDER_SITE_OTHER): Payer: 59 | Admitting: Gastroenterology

## 2015-12-19 VITALS — BP 146/84 | HR 90 | Ht 67.75 in | Wt 315.0 lb

## 2015-12-19 DIAGNOSIS — R1310 Dysphagia, unspecified: Secondary | ICD-10-CM | POA: Diagnosis not present

## 2015-12-19 DIAGNOSIS — K219 Gastro-esophageal reflux disease without esophagitis: Secondary | ICD-10-CM | POA: Diagnosis not present

## 2015-12-19 DIAGNOSIS — K5909 Other constipation: Secondary | ICD-10-CM | POA: Diagnosis not present

## 2015-12-19 DIAGNOSIS — R1012 Left upper quadrant pain: Secondary | ICD-10-CM | POA: Diagnosis not present

## 2015-12-19 NOTE — Patient Instructions (Signed)

## 2015-12-19 NOTE — Progress Notes (Signed)
Daniel [name]    [id number]    11/06/1971  Primary Care Physician:FRY,STEPHEN A, MD  Referring Physician: Nelwyn Salisbury, MD [address] [place], [place] [address]  Chief complaint:    epigastric pain,  constipation  HPI:  45 year old male with morbid obesity here for new patient visit with complaints of worsening left upper quadrant abdominal pain radiating to the epigastric region.  He reports having heartburn for the past 10-15 years,  Is on omeprazole daily with stable symptoms.  Over the past 1 year he is been having progressive worsening of left upper quadrant pain radiating to the epigastric region, no  association  with food intake. Complains of constant burping. And at times feels like the food sits in his chest and takes a long time to go down his throat.  He is trying to eat healthy and exercise but continues to gain weight has gained about 8-12 pounds in the past few months to a year.  Also complains of worsening constipation,  He did 2 herbal  cleanses in past 2 months.  Feels that his output doesn't match input. He is taking 1 capful of Miralax daily but does not feel complete evacuation  Weight is up    Outpatient Encounter Prescriptions as of 12/19/2015  Medication Sig  . albuterol (VENTOLIN HFA) 108 (90 BASE) MCG/ACT inhaler Inhale 2 puffs into the lungs every 4 (four) hours as needed for wheezing or shortness of breath.  . EPINEPHrine (EPIPEN 2-PAK) 0.3 mg/0.3 mL SOAJ injection Inject 0.3 mLs (0.3 mg total) into the muscle once.  [name] levocetirizine (XYZAL) 5 MG tablet Take 1 tablet (5 mg total) by mouth every evening.  . montelukast (SINGULAIR) 10 MG tablet Take 1 tablet (10 mg total) by mouth at bedtime.  . Multiple Vitamin (MULTIVITAMIN WITH MINERALS) TABS Take 1 tablet by mouth daily.  [name] omeprazole (PRILOSEC) 20 MG capsule Take 1 capsule (20 mg total) by mouth daily.  . Polyvinyl Alcohol-Povidone (CLEAR EYES ALL SEASONS) 5-6 MG/ML SOLN Apply 1 drop to  eye once as needed. For dryness   No facility-administered encounter medications on file as of 12/19/2015.    Allergies as of 12/19/2015 - Review Complete 12/19/2015  Allergen Reaction Noted  . Bee venom Anaphylaxis 07/18/2012  . Shellfish allergy Anaphylaxis 07/18/2012    Past Medical History  Diagnosis Date  . Asthma   . GERD (gastroesophageal reflux disease)   . Hypertension     History reviewed. No pertinent past surgical history.  Family History  Problem Relation Age of Onset  . Hyperlipidemia    . Hypertension    . Stroke      Social History   Social History  . Marital Status: Married    Spouse Name: N/A  . Number of Children: N/A  . Years of Education: N/A   Occupational History  . Not on file.   Social History Main Topics  . Smoking status: Never Smoker   . Smokeless tobacco: Never Used  . Alcohol Use: No  . Drug Use: No  . Sexual Activity: Yes    Birth Control/ Protection: None   Other Topics Concern  . Not on file   Social History Narrative   Married      Review of systems: Review of Systems  Constitutional: Negative for fever and chills.  HENT: Negative.   Eyes: Negative for blurred vision.  Respiratory: Negative for cough, shortness of breath and wheezing.  Cardiovascular: Negative for chest pain and palpitations.  Gastrointestinal: as per HPI Genitourinary: Negative for dysuria, urgency, frequency and hematuria.  Musculoskeletal: Negative for myalgias, back pain and joint pain.  Skin: Negative for itching and rash.  Neurological: Negative for dizziness, tremors, focal weakness, seizures and loss of consciousness.  Endo/Heme/Allergies: Negative for environmental allergies.  Psychiatric/Behavioral: Negative for depression, suicidal ideas and hallucinations.  All other systems reviewed and are negative.   Physical Exam: Filed Vitals:   12/19/15 1456  BP: 146/84  Pulse: 90   Gen:      No acute distress, morbidly obese HEENT:   EOMI, sclera anicteric Neck:     No masses; no thyromegaly Lungs:    Clear to auscultation bilaterally; normal respiratory effort CV:         Regular rate and rhythm; no murmurs Abd:      + bowel sounds; soft, non-tender; no palpable masses, no distension Ext:    No edema; adequate peripheral perfusion Skin:      Warm and dry; no rash Neuro: alert and oriented x 3 Psych: normal mood and affect  Data Reviewed: CT abd & pelvis 10/2015 Lower chest: The lung bases are clear of acute process. No pleural effusion or pulmonary lesions. The heart is normal in size. No pericardial effusion. The distal esophagus and aorta are unremarkable.  Hepatobiliary: No focal hepatic lesions or intrahepatic biliary dilatation. The gallbladder is normal. No common bile duct dilatation.  Pancreas: No mass, inflammation or ductal dilatation.  Spleen: Normal size. No focal lesions.  Adrenals/Urinary Tract: The adrenal glands are normal.  Small bilateral renal calculi and small upper pole right renal cyst. No obstructing ureteral calculi or bladder calculi.  Stomach/Bowel: The stomach, duodenum, small bowel and colon are unremarkable. No inflammatory changes, mass lesions or obstructive findings. Moderate diverticulosis involving the descending and sigmoid colon, particularly for the patient's age. No findings for acute diverticulitis. The terminal ileum is normal. The appendix is normal.  Vascular/Lymphatic: No mesenteric or retroperitoneal mass or adenopathy. The aorta and branch vessels are normal. The major venous structures are normal.  Reproductive: The bladder, prostate gland and seminal vesicles are unremarkable. Bilateral calcification of the vas deferens are noted. This can be seen with diabetes or chronic infection or hyperparathyroidism.  Other: No pelvic mass or adenopathy. No free pelvic fluid collections. No inguinal mass or adenopathy.  Musculoskeletal: No significant  bony findings.   Assessment and Plan/Recommendations:  45 year old male with morbid obesity and chronic GERD here with complaints of burping , dysphagia and epigastric/ left upper quadrant abdominal pain.  He also reports worsening constipation  Continue omeprazole daily  We will schedule for EGD for further evaluation  Advised patient antireflux measures: no meals 3 hr before bedtime and sleep with head end elevation  no evidence of gallbladder disease based on CT abdomen and pelvis and no other acute pathology  Advise patient to titrate up MiraLAX to have 1 soft bowel movement per day  Iona Beard , MD [phone] Mon-Fri 8a-5p [phone] after 5p, weekends, holidays

## 2016-01-17 ENCOUNTER — Other Ambulatory Visit: Payer: Self-pay

## 2016-01-17 ENCOUNTER — Other Ambulatory Visit: Payer: Self-pay | Admitting: Family Medicine

## 2016-01-17 MED ORDER — MONTELUKAST SODIUM 10 MG PO TABS: 10.0000 mg | ORAL_TABLET | Freq: Every day | ORAL | Status: DC

## 2016-01-17 MED ORDER — LEVOCETIRIZINE DIHYDROCHLORIDE 5 MG PO TABS: 5.0000 mg | ORAL_TABLET | Freq: Every evening | ORAL | Status: DC

## 2016-01-17 MED FILL — MONTELUKAST SOD 10 MG TAB: 10 | 90 days supply | Qty: 90 | Fill #0

## 2016-01-17 MED FILL — LEVOCETIRIZINE 5 MG TABLET: 5 | 90 days supply | Qty: 90 | Fill #0

## 2016-01-17 NOTE — Telephone Encounter (Signed)
Pt has requested refill on Levocetirizine - ok to refill? [facility]

## 2016-01-25 ENCOUNTER — Telehealth: Payer: Self-pay | Admitting: Gastroenterology

## 2016-01-25 NOTE — Telephone Encounter (Signed)
Returned patients call. His paperwork did say 9 am but his procedure is actually at 2 pm. Put wrong times on paperwork. I apologized to patient and explained to him new times  He was fine with the mistake. Said he would be here at 1pm

## 2016-02-01 ENCOUNTER — Encounter: Payer: Self-pay | Admitting: Gastroenterology

## 2016-02-01 ENCOUNTER — Ambulatory Visit (AMBULATORY_SURGERY_CENTER): Payer: 59 | Admitting: Gastroenterology

## 2016-02-01 VITALS — BP 140/83 | HR 76 | Temp 97.7°F | Resp 18 | Ht 67.0 in | Wt 315.0 lb

## 2016-02-01 DIAGNOSIS — R1310 Dysphagia, unspecified: Secondary | ICD-10-CM | POA: Diagnosis present

## 2016-02-01 DIAGNOSIS — I10 Essential (primary) hypertension: Secondary | ICD-10-CM | POA: Diagnosis not present

## 2016-02-01 DIAGNOSIS — J45909 Unspecified asthma, uncomplicated: Secondary | ICD-10-CM | POA: Diagnosis not present

## 2016-02-01 MED ORDER — SODIUM CHLORIDE 0.9 % IV SOLN: 500.0000 mL | INTRAVENOUS | Status: DC

## 2016-02-01 NOTE — Op Note (Signed)
[facility] Patient Name: Daniel [name] Procedure Date: 02/01/2016 1:52 PM MRN: [id number] Endoscopist: Napoleon Form , MD Age: 45 Referring MD:  Date of Birth: [birth year] Gender: Male Procedure:                Upper GI endoscopy Indications:              Dysphagia Medicines:                Propofol total dose 300 mg IV Procedure:                Pre-Anesthesia Assessment:                           - Prior to the procedure, a History and Physical                            was performed, and patient medications and                            allergies were reviewed. The patient's tolerance of                            previous anesthesia was also reviewed. The risks                            and benefits of the procedure and the sedation                            options and risks were discussed with the patient.                            All questions were answered, and informed consent                            was obtained. Prior Anticoagulants: The patient has                            taken no previous anticoagulant or antiplatelet                            agents. ASA Grade Assessment: III - A patient with                            severe systemic disease. After reviewing the risks                            and benefits, the patient was deemed in                            satisfactory condition to undergo the procedure.                           After obtaining informed consent, the endoscope was  passed under direct vision. Throughout the                            procedure, the patient's blood pressure, pulse, and                            oxygen saturations were monitored continuously. The                            Model GIF-[id number]) scope was introduced                            through the mouth, and advanced to the second part                            of duodenum. The upper GI endoscopy was somewhat                     difficult due to the patient's body habitus.                            Successful completion of the procedure was aided by                            increasing the dose of sedation medication. The                            patient tolerated the procedure well. Scope In: Scope Out: Findings:      The esophagus was normal.      The stomach was normal.      The examined duodenum was normal. Complications:            No immediate complications. Estimated Blood Loss:     Estimated blood loss: none. Impression:               - Normal esophagus.                           - Normal stomach.                           - Normal examined duodenum.                           - No specimens collected. Recommendation:           - Patient has a contact number available for                            emergencies. The signs and symptoms of potential                            delayed complications were discussed with the                            patient. Return to normal activities tomorrow.  Written discharge instructions were provided to the                            patient.                           - Resume previous diet.                           - Continue present medications.                           - No repeat upper endoscopy. Procedure Code(s):        --- Professional ---                           [id number], Esophagogastroduodenoscopy, flexible,                            transoral; diagnostic, including collection of                            specimen(s) by brushing or washing, when performed                            (separate procedure) CPT copyright 2016 [employer]. All rights reserved. Napoleon Form, MD 02/01/2016 2:14:20 PM This report has been signed electronically. Number of Addenda: 0

## 2016-02-01 NOTE — Patient Instructions (Signed)
YOU HAD AN ENDOSCOPIC PROCEDURE TODAY AT THE [facility]:   Refer to the procedure report that was given to you for any specific questions about what was found during the examination.  If the procedure report does not answer your questions, please call your gastroenterologist to clarify.  If you requested that your care partner not be given the details of your procedure findings, then the procedure report has been included in a sealed envelope for you to review at your convenience later.  YOU SHOULD EXPECT: Some feelings of bloating in the abdomen. Passage of more gas than usual.  Walking can help get rid of the air that was put into your GI tract during the procedure and reduce the bloating. If you had a lower endoscopy (such as a colonoscopy or flexible sigmoidoscopy) you may notice spotting of blood in your stool or on the toilet paper. If you underwent a bowel prep for your procedure, you may not have a normal bowel movement for a few days.  Please Note:  You might notice some irritation and congestion in your nose or some drainage.  This is from the oxygen used during your procedure.  There is no need for concern and it should clear up in a day or so.  SYMPTOMS TO REPORT IMMEDIATELY:    Following upper endoscopy (EGD)  Vomiting of blood or coffee ground material  New chest pain or pain under the shoulder blades  Painful or persistently difficult swallowing  New shortness of breath  Fever of 100F or higher  Black, tarry-looking stools  For urgent or emergent issues, a gastroenterologist can be reached at any hour by calling [phone].   DIET: Your first meal following the procedure should be a small meal and then it is ok to progress to your normal diet. Heavy or fried foods are harder to digest and may make you feel nauseous or bloated.  Likewise, meals heavy in dairy and vegetables can increase bloating.  Drink plenty of fluids but you should avoid alcoholic beverages  for 24 hours.  ACTIVITY:  You should plan to take it easy for the rest of today and you should NOT DRIVE or use heavy machinery until tomorrow (because of the sedation medicines used during the test).    FOLLOW UP: Our staff will call the number listed on your records the next business day following your procedure to check on you and address any questions or concerns that you may have regarding the information given to you following your procedure. If we do not reach you, we will leave a message.  However, if you are feeling well and you are not experiencing any problems, there is no need to return our call.  We will assume that you have returned to your regular daily activities without incident.  If any biopsies were taken you will be contacted by phone or by letter within the next 1-3 weeks.  Please call us at [phone] if you have not heard about the biopsies in 3 weeks.    SIGNATURES/CONFIDENTIALITY: You and/or your care partner have signed paperwork which will be entered into your electronic medical record.  These signatures attest to the fact that that the information above on your After Visit Summary has been reviewed and is understood.  Full responsibility of the confidentiality of this discharge information lies with you and/or your care-partner. 

## 2016-02-01 NOTE — Progress Notes (Signed)
A/ox3, pleased with MAC, report to RN 

## 2016-02-02 ENCOUNTER — Telehealth: Payer: Self-pay

## 2016-02-02 NOTE — Telephone Encounter (Signed)
  Follow up Call-  Call back number 02/01/2016  Post procedure Call Back phone  # [phone]  Permission to leave phone message Yes     Patient questions:  Do you have a fever, pain , or abdominal swelling? No. Pain Score  0 *  Have you tolerated food without any problems? Yes.    Have you been able to return to your normal activities? Yes.    Do you have any questions about your discharge instructions: Diet   No. Medications  No. Follow up visit  No.  Do you have questions or concerns about your Care? No.  Actions: * If pain score is 4 or above: No action needed, pain <4.

## 2016-04-25 ENCOUNTER — Other Ambulatory Visit: Payer: Self-pay | Admitting: Family Medicine

## 2016-04-25 MED FILL — MONTELUKAST SOD 10 MG TAB: 10 | 90 days supply | Qty: 90 | Fill #1

## 2016-04-26 MED FILL — LEVOCETIRIZINE 5 MG TABLET: 5 | 90 days supply | Qty: 90 | Fill #0

## 2016-05-06 DIAGNOSIS — H10413 Chronic giant papillary conjunctivitis, bilateral: Secondary | ICD-10-CM | POA: Diagnosis not present

## 2016-05-06 DIAGNOSIS — H00029 Hordeolum internum unspecified eye, unspecified eyelid: Secondary | ICD-10-CM | POA: Diagnosis not present

## 2016-07-25 MED FILL — OMEPRAZOLE DR 20 MG CAPSULE: 20 | 90 days supply | Qty: 90 | Fill #1

## 2016-08-05 MED FILL — MONTELUKAST SOD 10 MG TAB: 10 | 90 days supply | Qty: 90 | Fill #2

## 2016-08-05 MED FILL — LEVOCETIRIZINE 5 MG TABLET: 5 | 90 days supply | Qty: 90 | Fill #1

## 2016-08-13 IMAGING — CT CT ABD-PELV W/ CM
2 of 5 series · 15 of 46 positions shown, 17 images · IV contrast (OMNIPAQUE 300)
Comparison: None.

CLINICAL DATA: Intermittent left upper quadrant abdominal pain for
6 months.

EXAM:
CT ABDOMEN AND PELVIS WITH CONTRAST
TECHNIQUE: Multidetector CT imaging of the abdomen and pelvis was performed
using the standard protocol following bolus administration of
intravenous contrast.
CONTRAST:  100mL OMNIPAQUE IOHEXOL 300 MG/ML  SOLN

[Series 2: abd/ pelvis · axial · 0.82mm/px · z∈[-464,-14]mm · 12 of 102 slices shown, 14 images]
[im 6/102  soft-tissue]
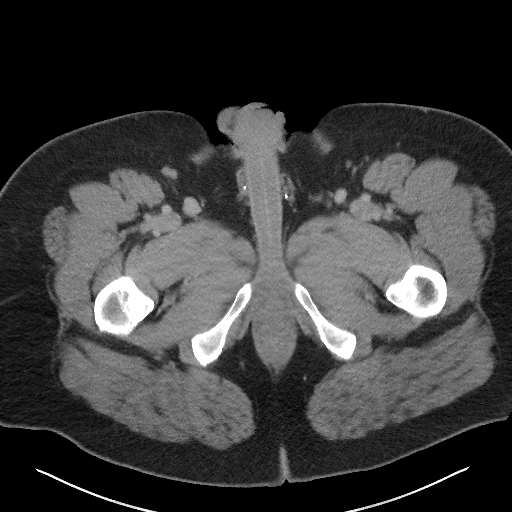
[im 6/102  bone]
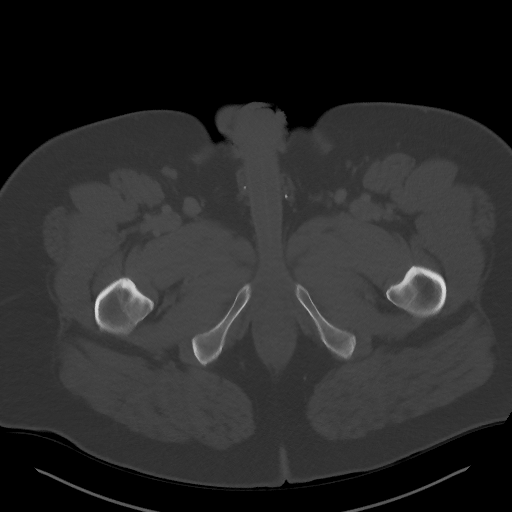
[im 16/102  soft-tissue]
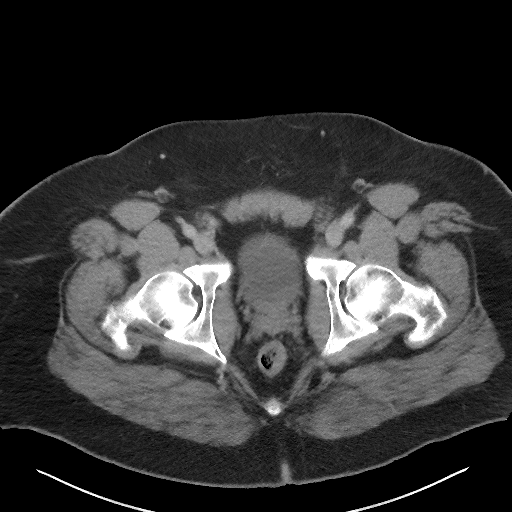
[im 21/102  soft-tissue]
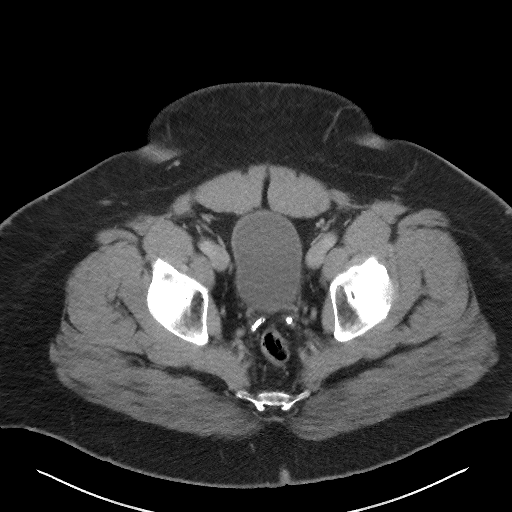
[im 31/102  soft-tissue]
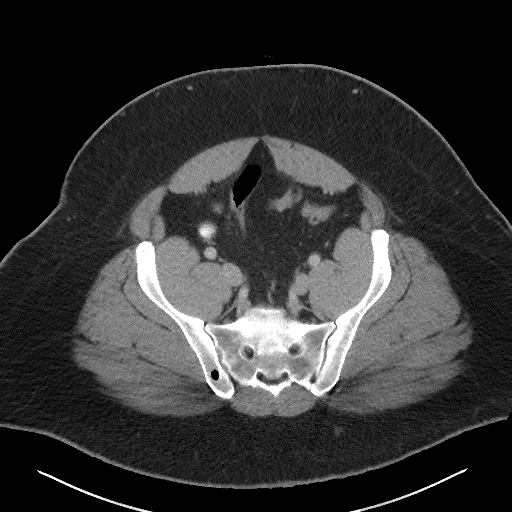
[im 41/102  soft-tissue]
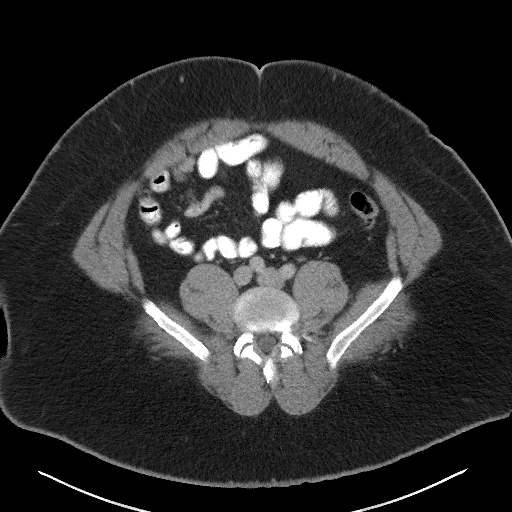
[im 46/102  soft-tissue]
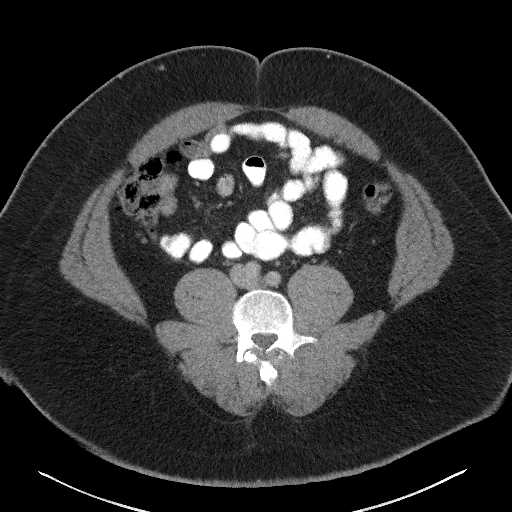
[im 56/102  soft-tissue]
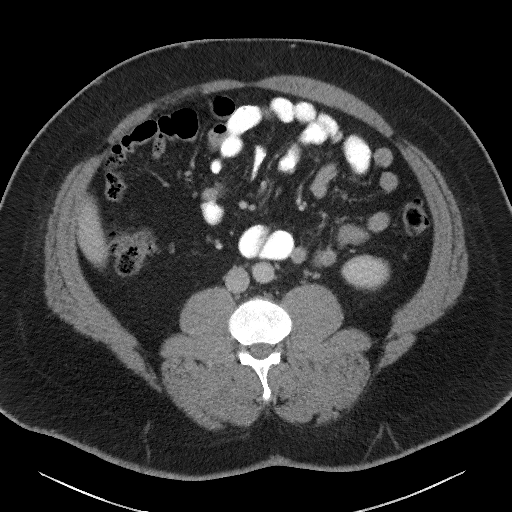
[im 61/102  soft-tissue]
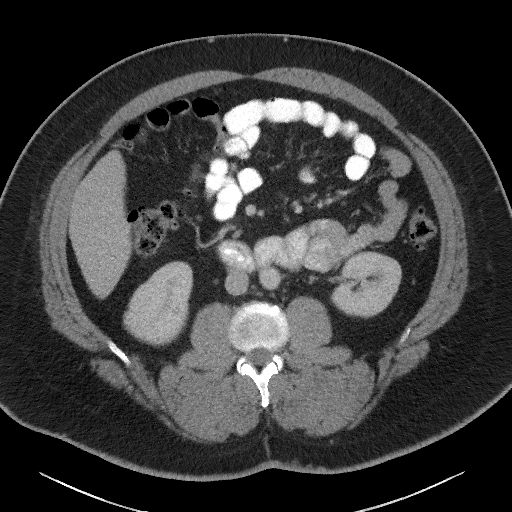
[im 71/102  soft-tissue]
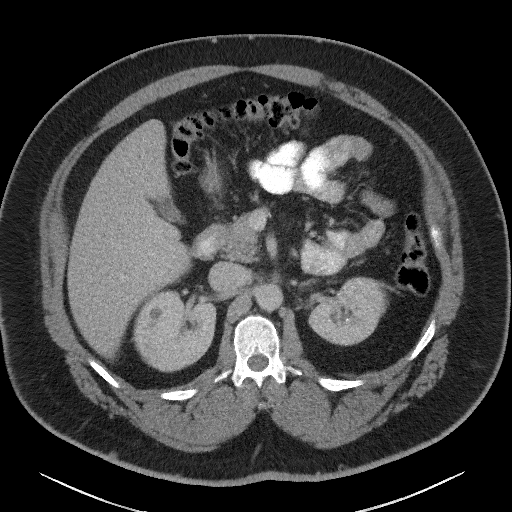
[im 71/102  bone]
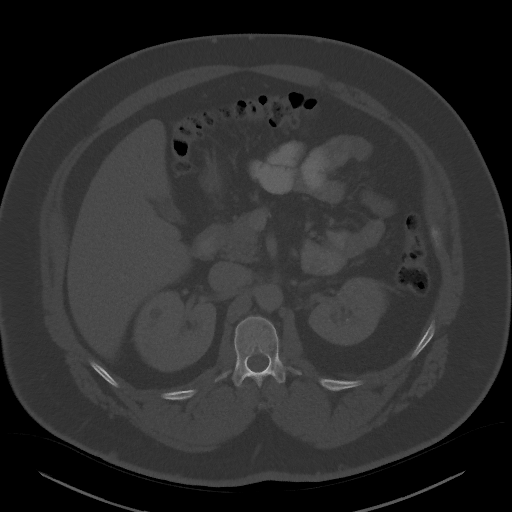
[im 81/102  soft-tissue]
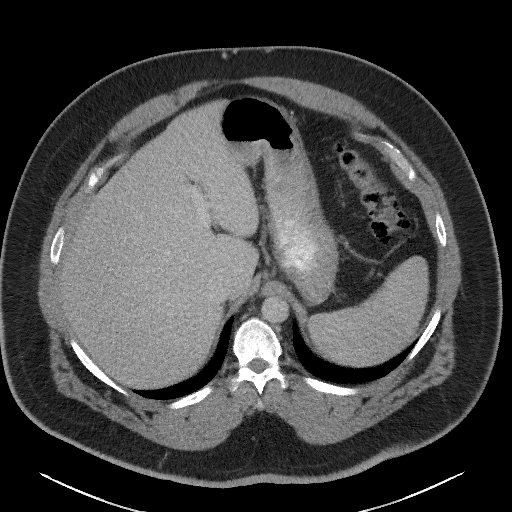
[im 86/102  soft-tissue]
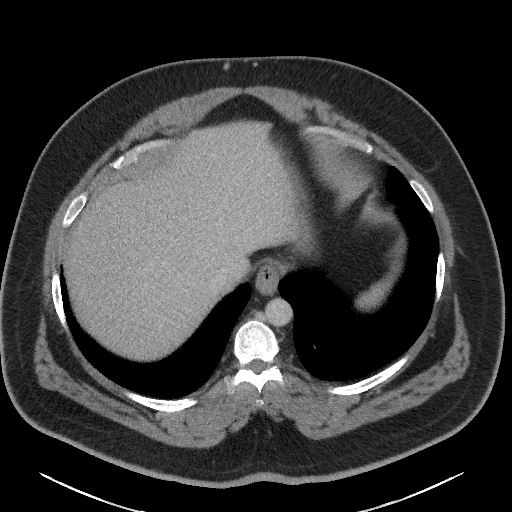
[im 96/102  soft-tissue]
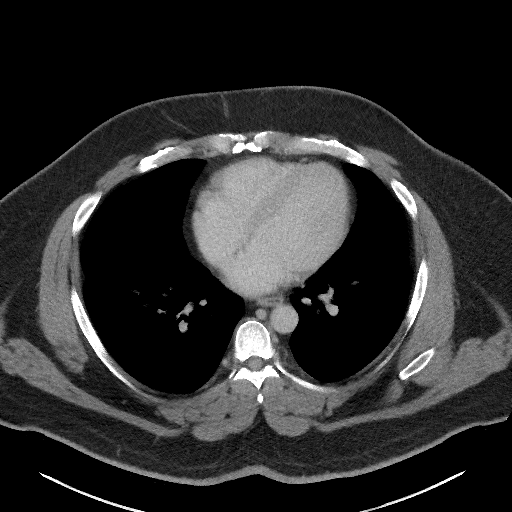

[Series 8: coronal soft tissue · coronal · 0.74mm/px · 3 of 118 slices shown]
[im 40/118  soft-tissue]
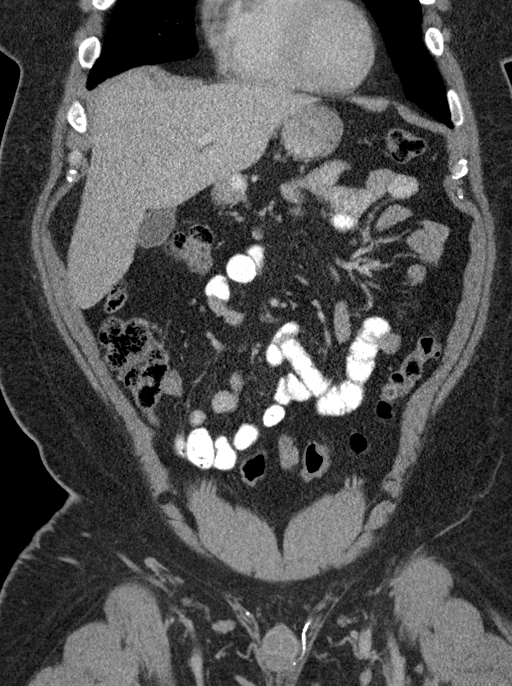
[im 53/118  soft-tissue]
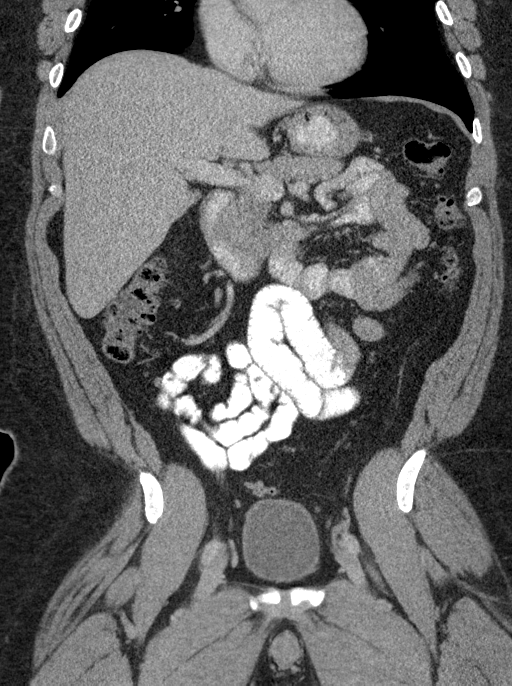
[im 66/118  soft-tissue]
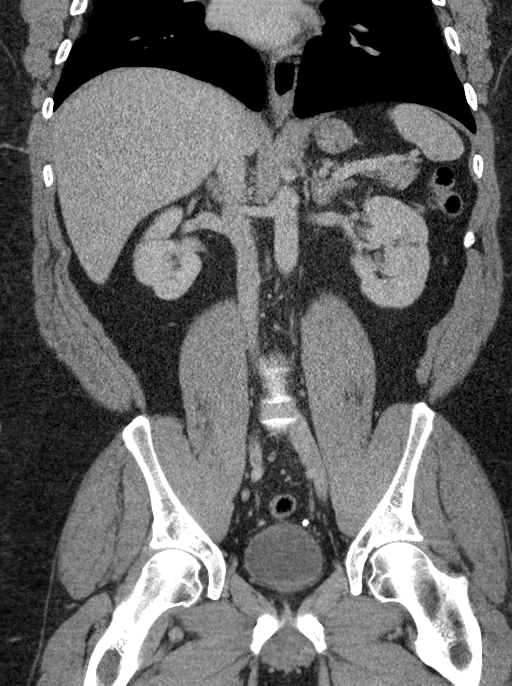

[15 of 46 positions shown; findings below may reference images not displayed]

FINDINGS: Lower chest: The lung bases are clear of acute process. No pleural
effusion or pulmonary lesions. The heart is normal in size. No
pericardial effusion. The distal esophagus and aorta are
unremarkable.

Hepatobiliary: No focal hepatic lesions or intrahepatic biliary
dilatation. The gallbladder is normal. No common bile duct
dilatation.

Pancreas: No mass, inflammation or ductal dilatation.

Spleen: Normal size.  No focal lesions.

Adrenals/Urinary Tract: The adrenal glands are normal.

Small bilateral renal calculi and small upper pole right renal cyst.
No obstructing ureteral calculi or bladder calculi.

Stomach/Bowel: The stomach, duodenum, small bowel and colon are
unremarkable. No inflammatory changes, mass lesions or obstructive
findings. Moderate diverticulosis involving the descending and
sigmoid colon, particularly for the patient's age. No findings for
acute diverticulitis. The terminal ileum is normal. The appendix is
normal.

Vascular/Lymphatic: No mesenteric or retroperitoneal mass or
adenopathy. The aorta and branch vessels are normal. The major
venous structures are normal.

Reproductive: The bladder, prostate gland and seminal vesicles are
unremarkable. Bilateral calcification of the vas deferens are noted.
This can be seen with diabetes or chronic infection or
hyperparathyroidism.

Other: No pelvic mass or adenopathy. No free pelvic fluid
collections. No inguinal mass or adenopathy.

Musculoskeletal: No significant bony findings.
IMPRESSION: 1. Moderate diverticulosis of the descending and sigmoid colon but
no findings for acute diverticulitis.
2. No acute abdominal/pelvic findings, mass lesions or
lymphadenopathy.
3. Small bilateral renal calculi and right renal cyst. No
obstructing ureteral calculi or bladder calculi.
4. Calcified vas deferens can be seen with diabetes, chronic
infection and hyperparathyroidism.

## 2016-10-10 ENCOUNTER — Encounter: Payer: Self-pay | Admitting: Family Medicine

## 2016-11-11 ENCOUNTER — Other Ambulatory Visit: Payer: Self-pay | Admitting: Family Medicine

## 2016-11-11 MED FILL — MONTELUKAST SOD 10 MG TAB: 10 | 90 days supply | Qty: 90 | Fill #3

## 2016-11-11 MED FILL — OMEPRAZOLE DR 20 MG CAPSULE: 20 | 90 days supply | Qty: 90 | Fill #0

## 2016-11-13 MED FILL — VENTOLIN HFA 90 MCG INHALER: 108 (90 BAS | 25 days supply | Qty: 18 | Fill #0

## 2016-12-05 ENCOUNTER — Encounter (INDEPENDENT_AMBULATORY_CARE_PROVIDER_SITE_OTHER): Payer: Self-pay | Admitting: Family Medicine

## 2016-12-11 ENCOUNTER — Ambulatory Visit (INDEPENDENT_AMBULATORY_CARE_PROVIDER_SITE_OTHER): Payer: Self-pay | Admitting: Family Medicine

## 2016-12-24 ENCOUNTER — Ambulatory Visit (INDEPENDENT_AMBULATORY_CARE_PROVIDER_SITE_OTHER): Payer: 59 | Admitting: Family Medicine

## 2016-12-24 ENCOUNTER — Encounter (INDEPENDENT_AMBULATORY_CARE_PROVIDER_SITE_OTHER): Payer: Self-pay | Admitting: Family Medicine

## 2016-12-24 VITALS — BP 160/91 | HR 69 | Temp 98.7°F | Ht 68.0 in | Wt 309.0 lb

## 2016-12-24 DIAGNOSIS — J4520 Mild intermittent asthma, uncomplicated: Secondary | ICD-10-CM

## 2016-12-24 DIAGNOSIS — R739 Hyperglycemia, unspecified: Secondary | ICD-10-CM | POA: Diagnosis not present

## 2016-12-24 DIAGNOSIS — I10 Essential (primary) hypertension: Secondary | ICD-10-CM

## 2016-12-24 DIAGNOSIS — Z1389 Encounter for screening for other disorder: Secondary | ICD-10-CM | POA: Diagnosis not present

## 2016-12-24 DIAGNOSIS — Z0289 Encounter for other administrative examinations: Secondary | ICD-10-CM

## 2016-12-24 DIAGNOSIS — R5383 Other fatigue: Secondary | ICD-10-CM

## 2016-12-24 DIAGNOSIS — R0602 Shortness of breath: Secondary | ICD-10-CM

## 2016-12-24 DIAGNOSIS — Z9189 Other specified personal risk factors, not elsewhere classified: Secondary | ICD-10-CM | POA: Diagnosis not present

## 2016-12-24 DIAGNOSIS — Z1331 Encounter for screening for depression: Secondary | ICD-10-CM

## 2016-12-24 NOTE — Progress Notes (Signed)
Office: [phone]  /  Fax: [phone]   HPI:   Chief Complaint: OBESITY  Daniel [name] (MR# [id number]) is a 46 y.o. male who presents on 12/24/2016 for obesity evaluation and treatment. Current BMI is Body mass index is 46.98 kg/m.Daniel [name] has struggled with obesity for years and has been unsuccessful in either losing weight or maintaining long term weight loss. Daniel [name] attended our information session and states he is currently in the action stage of change and ready to dedicate time achieving and maintaining a healthier weight. Daniel [name] eats out frequently and struggles to plan meals ahead of time. He does snack frequently espsically at night.  Daniel [name] states his desired weight loss is 69 lbs.  he has been heavy most of  his life he started gaining weight in college his heaviest weight ever was 324 lbs. he frequently makes poor food choices he struggles with emotional eating    Fatigue Daniel [name] feels his energy is lower than it should be. This has worsened with weight gain and has worsened recently. Daniel [name] denies daytime somnolence and  denies waking up still tired. Patient is at risk for obstructive sleep apnea. Patent has a history of symptoms of hypertension. Patient generally gets 6 hours of sleep per night, and states they generally have generally restful sleep. Snoring is present. Apneic episodes is not present. Epworth Sleepiness Score is 6.  Dyspnea on exertion Daniel [name] notes increasing shortness of breath with exercising and seems to be worsening over time with weight gain. He notes getting out of breath sooner with activity than he used to. This has gotten worse recently. He does have a history of  Asthma but currently stable. Daniel [name] denies orthopnea.   Hypertension Daniel [name] is a 46 y.o. male with hypertension.  Daniel [name] denies chest pain or shortness of breath on exertion. He is working weight loss to help control his blood pressure with the goal of decreasing his risk  of heart attack and stroke. Daniel [name] blood pressure is not currently controlled. His blood pressure was elevated today. He was diagnosed 2 years ago and was on medications previously. He is no longer on the medication and his blood pressure has been creeping up.    Asthma This is mild and intermittent. He is currently on Singulair, uses albuterol twice a month and denies any history of intubations.   Hyperglycemia Daniel [name] has a history of some elevated blood glucose readings without a diagnosis of diabetes. He admits to polyphagia. He is at risk for Diabetes Mellitus.   At risk for diabetes Daniel [name] is at higher than averagerisk for developing diabetes due to his obesity. He currently denies polyuria or polydipsia. Depression Screen Daniel [name]'s Food and Mood (modified PHQ-9) score was  Depression screen PHQ 2/9 12/24/2016  Decreased Interest 0  Down, Depressed, Hopeless 0  PHQ - 2 Score 0  Altered sleeping 0  Tired, decreased energy 1  Change in appetite 0  Feeling bad or failure about yourself  0  Trouble concentrating 1  Moving slowly or fidgety/restless 0  Suicidal thoughts 0  PHQ-9 Score 2    ALLERGIES: Allergies  Allergen Reactions  . Bee Venom Anaphylaxis  . Shellfish Allergy Anaphylaxis    MEDICATIONS: Current Outpatient Prescriptions on File Prior to Visit  Medication Sig Dispense Refill  . EPINEPHrine (EPIPEN 2-PAK) 0.3 mg/0.3 mL SOAJ injection Inject 0.3 mLs (0.3 mg total) into the muscle once. 1 Device 11  . levocetirizine (XYZAL) 5 MG tablet TAKE 1 TABLET BY MOUTH  EVERY EVENING. 90 tablet 1  . montelukast (SINGULAIR) 10 MG tablet Take 1 tablet (10 mg total) by mouth at bedtime. 90 tablet 3  . Multiple Vitamin (MULTIVITAMIN WITH MINERALS) TABS Take 1 tablet by mouth daily.    [name] omeprazole (PRILOSEC) 20 MG capsule TAKE 1 CAPSULE BY MOUTH ONCE DAILY 90 capsule 0  . Polyvinyl Alcohol-Povidone (CLEAR EYES ALL SEASONS) 5-6 MG/ML SOLN Apply 1 drop to eye once as needed. Reported  on 02/01/2016    . VENTOLIN HFA 108 (90 Base) MCG/ACT inhaler INHALE 2 PUFFS INTO THE LUNGS EVERY 4 HOURS AS NEEDED FOR WHEEZING OR SHORTNESS OF BREATH. 18 g 1   No current facility-administered medications on file prior to visit.     PAST MEDICAL HISTORY: Past Medical History:  Diagnosis Date  . Asthma   . GERD (gastroesophageal reflux disease)   . Hypertension     PAST SURGICAL HISTORY: History reviewed. No pertinent surgical history.  SOCIAL HISTORY: Social History  Substance Use Topics  . Smoking status: Never Smoker  . Smokeless tobacco: Never Used  . Alcohol use No    FAMILY HISTORY: Family History  Problem Relation Age of Onset  . Hyperlipidemia    . Hypertension    . Stroke    . Hypertension Mother   . Hyperlipidemia Mother   . Obesity Mother   . Sudden death Father     ROS: Review of Systems  Constitutional: Positive for malaise/fatigue.  Respiratory: Positive for wheezing.   Gastrointestinal: Positive for constipation and heartburn.  Skin: Positive for rash.  Endo/Heme/Allergies:       Polyphagia    PHYSICAL EXAM: Blood pressure (!) 160/91, pulse 69, temperature 98.7 F (37.1 C), temperature source Oral, height 5\' 8"  (1.727 m), weight (!) 309 lb (140.2 kg), SpO2 96 %. Body mass index is 46.98 kg/m. Physical Exam  Constitutional: He is oriented to person, place, and time. He appears well-developed and well-nourished.  HENT:  Head: Normocephalic and atraumatic.  Eyes: EOM are normal. Pupils are equal, round, and reactive to light.  Neck: Normal range of motion. Neck supple.  Cardiovascular: Normal rate and regular rhythm.   Pulmonary/Chest: Effort normal and breath sounds normal.  Abdominal: Soft. Bowel sounds are normal.  Musculoskeletal: Normal range of motion. He exhibits edema (on BLE).  Neurological: He is alert and oriented to person, place, and time.  Skin: Skin is warm and dry.  Psychiatric: He has a normal mood and affect. His behavior is  normal.  Vitals reviewed.   RECENT LABS AND TESTS: BMET    Component Value Date/Time   NA 140 11/07/2015 1454   K 4.3 11/07/2015 1454   CL 106 11/07/2015 1454   CO2 26 11/07/2015 1454   GLUCOSE 103 (H) 11/07/2015 1454   BUN 17 11/07/2015 1454   CREATININE 1.20 11/07/2015 1454   CALCIUM 9.5 11/07/2015 1454   GFRNONAA 46 (L) 07/18/2012 2030   GFRAA 53 (L) 07/18/2012 2030   No results found for: HGBA1C No results found for: INSULIN CBC    Component Value Date/Time   WBC 10.2 11/07/2015 1454   RBC 5.67 11/07/2015 1454   HGB 15.5 11/07/2015 1454   HCT 46.6 11/07/2015 1454   PLT 300.0 11/07/2015 1454   MCV 82.2 11/07/2015 1454   MCH 28.0 07/18/2012 2030   MCHC 33.3 11/07/2015 1454   RDW 14.1 11/07/2015 1454   LYMPHSABS 3.4 11/07/2015 1454   MONOABS 0.9 11/07/2015 1454   EOSABS 0.3 11/07/2015 1454  BASOSABS 0.0 11/07/2015 1454   Iron/TIBC/Ferritin/ %Sat No results found for: IRON, TIBC, FERRITIN, IRONPCTSAT Lipid Panel     Component Value Date/Time   CHOL 181 03/17/2015 0841   TRIG 114.0 03/17/2015 0841   HDL 35.30 (L) 03/17/2015 0841   CHOLHDL 5 03/17/2015 0841   VLDL 22.8 03/17/2015 0841   LDLCALC 123 (H) 03/17/2015 0841   Hepatic Function Panel     Component Value Date/Time   PROT 7.3 11/07/2015 1454   ALBUMIN 4.1 11/07/2015 1454   AST 13 11/07/2015 1454   ALT 18 11/07/2015 1454   ALKPHOS 81 11/07/2015 1454   BILITOT 0.2 11/07/2015 1454   BILIDIR 0.1 11/07/2015 1454      Component Value Date/Time   TSH 1.39 03/17/2015 0841   TSH 1.58 03/02/2014 0911   TSH 1.39 02/24/2013 0930    ECG  shows NSR with a rate of 69 bpm.  INDIRECT CALORIMETER done today shows a VO2 of 338 and a REE of 2349.    ASSESSMENT AND PLAN: Other fatigue - Plan: EKG 12-Lead, CBC With Differential, Vitamin B12, Comprehensive metabolic panel, T3, T4, free, TSH, VITAMIN D 25 Hydroxy (Vit-D Deficiency, Fractures), Folate, Lipid Panel With LDL/HDL Ratio  Shortness of breath on  exertion  Essential hypertension  Hyperglycemia - Plan: Hemoglobin A1c, Insulin, random  Asthma, unspecified asthma severity, unspecified whether complicated, unspecified whether persistent  Depression screening  At risk for diabetes mellitus  Morbid obesity (HCC)  PLAN:  Fatigue Daniel [name] was informed that his fatigue may be related to obesity, depression or many other causes. Labs will be ordered, and in the meanwhile Olyver has agreed to work on diet, exercise and weight loss to help with fatigue. Proper sleep hygiene was discussed including the need for 7-8 hours of quality sleep each night. A sleep study was not ordered based on symptoms and Epworth score.  Dyspnea on exertion Daniel [name]'s shortness of breath appears to be obesity related and exercise induced. He has agreed to work on weight loss and gradually increase exercise to treat his exercise induced shortness of breath. If Daniel [name] follows our instructions and loses weight without improvement of his shortness of breath, we will plan to refer to pulmonology. We will monitor this condition regularly. Daniel [name] agrees to this plan.  Hypertension We discussed sodium restriction, working on healthy weight loss, and a regular exercise program as the means to achieve improved blood pressure control. Gerren agreed with this plan and agreed to follow up as directed. We will continue to monitor his blood pressure as well as his progress with the above lifestyle modifications. If his BP is elevated at next visit we will discuss starting medications.  Asthma Will continue with weight loss efforts to help with Daniel [name].  Hyperglycemia Fasting labs will be obtained and results with be discussed with Daniel [name] in 2 weeks at his follow up visit. In the meanwhile Daniel [name] was started on a lower simple carbohydrate diet and will work on weight loss efforts.   Diabetes risk counselling Daniel [name] was given extended (at least 15 minutes) diabetes prevention  counseling today. He is 46 y.o. male and has risk factors for diabetes including obesity. We discussed intensive lifestyle modifications today with an emphasis on weight loss as well as increasing exercise and decreasing simple carbohydrates in his diet.  Depression Screen Press had a positive depression screening. Depression is commonly associated with obesity and often results in emotional eating behaviors. We will monitor this closely and work on CBT to help improve  the non-hunger eating patterns. Referral to Psychology may be required if no improvement is seen as he continues in our clinic.  Obesity Daniel Centerracey is currently in the action stage of change and his goal is to continue with weight loss efforts He has agreed to follow the Category 3 plan Daniel Centerracey has been instructed to work up to a goal of 150 minutes of combined cardio and strengthening exercise per week for weight loss and overall health benefits. We discussed the following Behavioral Modification Stratagies today: increasing lean protein intake and decreasing simple carbohydrates   Daniel Centerracey has agreed to follow up with our clinic in 2 weeks. He was informed of the importance of frequent follow up visits to maximize his success with intensive lifestyle modifications for his multiple health conditions. He was informed we would discuss his lab results at his next visit unless there is a critical issue that needs to be addressed sooner. Daniel [name] agreed to keep his next visit at the agreed upon time to discuss these results.  I, April Moore, am acting as Neurosurgeonscribe for Quillian Quincearen Freada Twersky, MD  I have reviewed the above documentation for accuracy and completeness, and I agree with the above. -Quillian Quincearen Jaidence Geisler, MD

## 2016-12-25 LAB — CBC WITH DIFFERENTIAL
Basophils Absolute: 0.1 10*3/uL (ref 0.0–0.2)
Basos: 1 %
EOS (ABSOLUTE): 0.2 10*3/uL (ref 0.0–0.4)
EOS: 3 %
HEMATOCRIT: 44.3 % (ref 37.5–51.0)
Hemoglobin: 15.2 g/dL (ref 13.0–17.7)
IMMATURE GRANULOCYTES: 0 %
Immature Grans (Abs): 0 10*3/uL (ref 0.0–0.1)
LYMPHS: 41 %
Lymphocytes Absolute: 2.7 10*3/uL (ref 0.7–3.1)
MCH: 27.8 pg (ref 26.6–33.0)
MCHC: 34.3 g/dL (ref 31.5–35.7)
MCV: 81 fL (ref 79–97)
MONOCYTES: 8 %
MONOS ABS: 0.5 10*3/uL (ref 0.1–0.9)
Neutrophils Absolute: 3.1 10*3/uL (ref 1.4–7.0)
Neutrophils: 47 %
RBC: 5.47 x10E6/uL (ref 4.14–5.80)
RDW: 14.6 % (ref 12.3–15.4)
WBC: 6.6 10*3/uL (ref 3.4–10.8)

## 2016-12-25 LAB — COMPREHENSIVE METABOLIC PANEL
ALBUMIN: 4.6 g/dL (ref 3.5–5.5)
ALK PHOS: 68 IU/L (ref 39–117)
ALT: 25 IU/L (ref 0–44)
AST: 15 IU/L (ref 0–40)
Albumin/Globulin Ratio: 1.5 (ref 1.2–2.2)
BUN / CREAT RATIO: 12 (ref 9–20)
BUN: 13 mg/dL (ref 6–24)
Bilirubin Total: 0.3 mg/dL (ref 0.0–1.2)
CO2: 22 mmol/L (ref 18–29)
CREATININE: 1.09 mg/dL (ref 0.76–1.27)
Calcium: 9.4 mg/dL (ref 8.7–10.2)
Chloride: 101 mmol/L (ref 96–106)
GFR calc Af Amer: 96 mL/min/{1.73_m2} (ref >59)
GFR calc non Af Amer: 83 mL/min/{1.73_m2} (ref >59)
GLUCOSE: 80 mg/dL (ref 65–99)
Globulin, Total: 3.1 g/dL (ref 1.5–4.5)
Potassium: 4.8 mmol/L (ref 3.5–5.2)
Sodium: 141 mmol/L (ref 134–144)
Total Protein: 7.7 g/dL (ref 6.0–8.5)

## 2016-12-25 LAB — LIPID PANEL WITH LDL/HDL RATIO
Cholesterol, Total: 198 mg/dL (ref 100–199)
HDL: 37 mg/dL — ABNORMAL LOW (ref >39)
LDL Calculated: 145 mg/dL — ABNORMAL HIGH (ref 0–99)
LDL/HDL RATIO: 3.9 ratio — AB (ref 0.0–3.6)
Triglycerides: 80 mg/dL (ref 0–149)
VLDL CHOLESTEROL CAL: 16 mg/dL (ref 5–40)

## 2016-12-25 LAB — T4, FREE: FREE T4: 1.18 ng/dL (ref 0.82–1.77)

## 2016-12-25 LAB — INSULIN, RANDOM: INSULIN: 25.8 u[IU]/mL — AB (ref 2.6–24.9)

## 2016-12-25 LAB — VITAMIN B12: Vitamin B-12: 488 pg/mL (ref 232–1245)

## 2016-12-25 LAB — HEMOGLOBIN A1C
ESTIMATED AVERAGE GLUCOSE: 111 mg/dL
HEMOGLOBIN A1C: 5.5 % (ref 4.8–5.6)

## 2016-12-25 LAB — VITAMIN D 25 HYDROXY (VIT D DEFICIENCY, FRACTURES): Vit D, 25-Hydroxy: 18.3 ng/mL — ABNORMAL LOW (ref 30.0–100.0)

## 2016-12-25 LAB — TSH: TSH: 1.37 u[IU]/mL (ref 0.450–4.500)

## 2016-12-25 LAB — FOLATE: Folate: 13.1 ng/mL (ref >3.0)

## 2016-12-25 LAB — T3: T3 TOTAL: 143 ng/dL (ref 71–180)

## 2016-12-27 ENCOUNTER — Ambulatory Visit (INDEPENDENT_AMBULATORY_CARE_PROVIDER_SITE_OTHER): Payer: 59 | Admitting: Family Medicine

## 2016-12-27 ENCOUNTER — Encounter: Payer: Self-pay | Admitting: Family Medicine

## 2016-12-27 VITALS — BP 142/88 | HR 83 | Temp 98.3°F | Ht 67.75 in | Wt 310.0 lb

## 2016-12-27 DIAGNOSIS — Z0000 Encounter for general adult medical examination without abnormal findings: Secondary | ICD-10-CM

## 2016-12-27 LAB — PSA: PSA: 0.14 ng/mL (ref 0.10–4.00)

## 2016-12-27 MED ORDER — LEVOCETIRIZINE DIHYDROCHLORIDE 5 MG PO TABS: 5.0000 mg | ORAL_TABLET | Freq: Every evening | ORAL | 3 refills | Status: DC

## 2016-12-27 MED ORDER — MONTELUKAST SODIUM 10 MG PO TABS: 10.0000 mg | ORAL_TABLET | Freq: Every day | ORAL | 3 refills | Status: DC

## 2016-12-27 MED ORDER — ALBUTEROL SULFATE HFA 108 (90 BASE) MCG/ACT IN AERS: INHALATION_SPRAY | RESPIRATORY_TRACT | 5 refills | Status: DC

## 2016-12-27 MED FILL — VENTOLIN HFA 90 MCG INHALER: 108 (90 BAS | 25 days supply | Qty: 18 | Fill #0

## 2016-12-27 MED FILL — LEVOCETIRIZINE 5 MG TABLET: 5 | 90 days supply | Qty: 90 | Fill #0

## 2016-12-27 NOTE — Progress Notes (Signed)
   Subjective:    Patient ID: Daniel [name], male    DOB: [birth year], 46 y.o.   MRN: [id number]  HPI 46 yr old male for a well exam. He feels great. He recently enrolled [facility] which involves meeting with personal trainers and nutritionists.    Review of Systems  Constitutional: Negative.   HENT: Negative.   Eyes: Negative.   Respiratory: Negative.   Cardiovascular: Negative.   Gastrointestinal: Negative.   Genitourinary: Negative.   Musculoskeletal: Negative.   Skin: Negative.   Neurological: Negative.   Psychiatric/Behavioral: Negative.        Objective:   Physical Exam  Constitutional: He is oriented to person, place, and time. He appears well-developed and well-nourished. No distress.  HENT:  Head: Normocephalic and atraumatic.  Right Ear: External ear normal.  Left Ear: External ear normal.  Nose: Nose normal.  Mouth/Throat: Oropharynx is clear and moist. No oropharyngeal exudate.  Eyes: Conjunctivae and EOM are normal. Pupils are equal, round, and reactive to light. Right eye exhibits no discharge. Left eye exhibits no discharge. No scleral icterus.  Neck: Neck supple. No JVD present. No tracheal deviation present. No thyromegaly present.  Cardiovascular: Normal rate, regular rhythm, normal heart sounds and intact distal pulses.  Exam reveals no gallop and no friction rub.   No murmur heard. Pulmonary/Chest: Effort normal and breath sounds normal. No respiratory distress. He has no wheezes. He has no rales. He exhibits no tenderness.  Abdominal: Soft. Bowel sounds are normal. He exhibits no distension and no mass. There is no tenderness. There is no rebound and no guarding.  Genitourinary: Rectum normal, prostate normal and penis normal. Rectal exam shows guaiac negative stool. No penile tenderness.  Musculoskeletal: Normal range of motion. He exhibits no edema or tenderness.  Lymphadenopathy:    He has no cervical adenopathy.    Neurological: He is alert and oriented to person, place, and time. He has normal reflexes. No cranial nerve deficit. He exhibits normal muscle tone. Coordination normal.  Skin: Skin is warm and dry. No rash noted. He is not diaphoretic. No erythema. No pallor.  Psychiatric: He has a normal mood and affect. His behavior is normal. Judgment and thought content normal.          Assessment & Plan:  Well exam. We discussed diet and exercise. His BP is borderline high, his LDL is high, and he is showing signs of insulin resistance. Hopefully losing weight will address all these.  [name] CraneStephen Fry, MD

## 2017-01-07 ENCOUNTER — Ambulatory Visit (INDEPENDENT_AMBULATORY_CARE_PROVIDER_SITE_OTHER): Payer: 59 | Admitting: Family Medicine

## 2017-01-07 DIAGNOSIS — I10 Essential (primary) hypertension: Secondary | ICD-10-CM | POA: Diagnosis not present

## 2017-01-07 DIAGNOSIS — E784 Other hyperlipidemia: Secondary | ICD-10-CM | POA: Diagnosis not present

## 2017-01-07 DIAGNOSIS — E8881 Metabolic syndrome: Secondary | ICD-10-CM

## 2017-01-07 DIAGNOSIS — Z9189 Other specified personal risk factors, not elsewhere classified: Secondary | ICD-10-CM | POA: Diagnosis not present

## 2017-01-07 DIAGNOSIS — E559 Vitamin D deficiency, unspecified: Secondary | ICD-10-CM | POA: Diagnosis not present

## 2017-01-07 DIAGNOSIS — E7849 Other hyperlipidemia: Secondary | ICD-10-CM

## 2017-01-07 MED ORDER — VITAMIN D (ERGOCALCIFEROL) 1.25 MG (50000 UNIT) PO CAPS: 50000.0000 [IU] | ORAL_CAPSULE | ORAL | 0 refills | Status: DC

## 2017-01-07 MED ORDER — DILTIAZEM HCL ER COATED BEADS 240 MG PO TB24: 240.0000 mg | ORAL_TABLET | Freq: Every day | ORAL | 0 refills | Status: DC

## 2017-01-07 MED FILL — DILTIAZEM ER 240 MG TABLET: 240 | 30 days supply | Qty: 30 | Fill #0

## 2017-01-07 MED FILL — VIT D2 1.25 MG (50,000 UNIT: 1.25 MG | 28 days supply | Qty: 4 | Fill #0

## 2017-01-07 NOTE — Progress Notes (Signed)
Office: [phone]  /  Fax: [phone]   HPI:   Chief Complaint: OBESITY Daniel [name] is here to discuss his progress with his obesity treatment plan. He is following his eating plan approximately 85 % of the time and states he is exercising with Elliptical 30 minutes 3 times per week. Daniel [name] is currently struggling with following the plan on the weekends due to a change in his schedule. He has done well with weight loss. He didn't like the milk and he notes some hunger in the afternoons. His weight is (!) 303 lb (137.4 kg) today and has had a weight loss of 6 pounds over a period of 2 weeks since his last visit. He has lost 6 lbs since starting treatment with [name].  Hypertension Daniel [name] is a 46 y.o. male with hypertension. Daniel [name] blood pressure has been elevated 3 times in a row.  Daniel [name] denies chest pain or shortness of breath on exertion. He is working weight loss to help control his blood pressure with the goal of decreasing his risk of heart attack and stroke. Daniel [name] blood pressure is not currently controlled.  Hyperlipidemia Daniel [name] has hyperlipidemia and has been trying to improve his cholesterol levels with intensive lifestyle modification including a low saturated fat diet, exercise and weight loss. He denies any chest pain, claudication or myalgias.  Vitamin D deficiency Daniel [name] has a diagnosis of vitamin D deficiency. He is not currently taking vit D and denies nausea, vomiting or muscle weakness.  Insulin Resistance Daniel [name] has a diagnosis of insulin resistance based on his elevated fasting insulin level >5. Although Daniel [name] blood glucose readings are still under good control, insulin resistance puts him at greater risk of metabolic syndrome and diabetes. He is not taking metformin currently and continues to work on diet and exercise to decrease risk of diabetes.  At risk for diabetes Daniel [name] is at higher than average risk for developing diabetes due to his obesity.  He currently denies polyuria or polydipsia.   Wt Readings from Last 500 Encounters:  01/07/17 (!) 303 lb (137.4 kg)  12/27/16 (!) 310 lb (140.6 kg)  12/24/16 (!) 309 lb (140.2 kg)  02/01/16 (!) 315 lb (142.9 kg)  12/19/15 (!) 315 lb (142.9 kg)  11/07/15 (!) 312 lb (141.5 kg)  03/22/15 (!) 308 lb (139.7 kg)  02/03/15 (!) 303 lb (137.4 kg)  05/19/14 299 lb (135.6 kg)  03/07/14 298 lb (135.2 kg)  12/22/13 298 lb (135.2 kg)  03/03/13 292 lb (132.5 kg)  07/18/12 285 lb (129.3 kg)  09/11/11 287 lb (130.2 kg)  06/13/11 278 lb (126.1 kg)  06/11/11 288 lb (130.6 kg)  11/02/10 (!) 288 lb (130.6 kg)  05/14/10 (!) 275 lb (124.7 kg)  10/26/08 (!) 281 lb (127.5 kg)  07/22/08 (!) 270 lb (122.5 kg)     ALLERGIES: Allergies  Allergen Reactions  . Bee Venom Anaphylaxis  . Shellfish Allergy Anaphylaxis    MEDICATIONS: Current Outpatient Prescriptions on File Prior to Visit  Medication Sig Dispense Refill  . albuterol (VENTOLIN HFA) 108 (90 Base) MCG/ACT inhaler INHALE 2 PUFFS INTO THE LUNGS EVERY 4 HOURS AS NEEDED FOR WHEEZING OR SHORTNESS OF BREATH. 18 g 5  . EPINEPHrine (EPIPEN 2-PAK) 0.3 mg/0.3 mL SOAJ injection Inject 0.3 mLs (0.3 mg total) into the muscle once. 1 Device 11  . levocetirizine (XYZAL) 5 MG tablet Take 1 tablet (5 mg total) by mouth every evening. 90 tablet 3  . montelukast (SINGULAIR) 10 MG tablet Take 1  tablet (10 mg total) by mouth at bedtime. 90 tablet 3  . Multiple Vitamin (MULTIVITAMIN WITH MINERALS) TABS Take 1 tablet by mouth daily.    [name]. omeprazole (PRILOSEC) 20 MG capsule TAKE 1 CAPSULE BY MOUTH ONCE DAILY 90 capsule 0  . Polyvinyl Alcohol-Povidone (CLEAR EYES Daniel [name] SEASONS) 5-6 MG/ML SOLN Apply 1 drop to eye once as needed. Reported on 02/01/2016     No current facility-administered medications on file prior to visit.     PAST MEDICAL HISTORY: Past Medical History:  Diagnosis Date  . Asthma   . GERD (gastroesophageal reflux disease)   . Hypertension      PAST SURGICAL HISTORY: No past surgical history on file.  SOCIAL HISTORY: Social History  Substance Use Topics  . Smoking status: Never Smoker  . Smokeless tobacco: Never Used  . Alcohol use No    FAMILY HISTORY: Family History  Problem Relation Age of Onset  . Hyperlipidemia    . Hypertension    . Stroke    . Hypertension Mother   . Hyperlipidemia Mother   . Obesity Mother   . Sudden death Father     ROS: Review of Systems  Constitutional: Positive for malaise/fatigue.  Cardiovascular: Negative for chest pain and claudication.  Gastrointestinal: Negative for nausea and vomiting.  Genitourinary: Negative for frequency.  Musculoskeletal: Negative for myalgias.       Negative Muscle Weakness  Endo/Heme/Allergies: Negative for polydipsia.    PHYSICAL EXAM: Blood pressure (!) 158/79, pulse 68, temperature 98.2 F (36.8 C), temperature source Oral, height 5\' 7"  (1.702 m), weight (!) 303 lb (137.4 kg), SpO2 97 %. Body mass index is 47.46 kg/m. Physical Exam  Constitutional: He is oriented to person, place, and time. He appears well-developed and well-nourished.  Cardiovascular: Normal rate.   Pulmonary/Chest: Effort normal.  Musculoskeletal: Normal range of motion.  Neurological: He is oriented to person, place, and time.  Skin: Skin is warm and dry.  Psychiatric: He has a normal mood and affect. His behavior is normal.    RECENT LABS AND TESTS: BMET    Component Value Date/Time   NA 141 12/24/2016 1021   K 4.8 12/24/2016 1021   CL 101 12/24/2016 1021   CO2 22 12/24/2016 1021   GLUCOSE 80 12/24/2016 1021   GLUCOSE 103 (H) 11/07/2015 1454   BUN 13 12/24/2016 1021   CREATININE 1.09 12/24/2016 1021   CALCIUM 9.4 12/24/2016 1021   GFRNONAA 83 12/24/2016 1021   GFRAA 96 12/24/2016 1021   Lab Results  Component Value Date   HGBA1C 5.5 12/24/2016   Lab Results  Component Value Date   INSULIN 25.8 (H) 12/24/2016   CBC    Component Value Date/Time    WBC 6.6 12/24/2016 1021   WBC 10.2 11/07/2015 1454   RBC 5.47 12/24/2016 1021   RBC 5.67 11/07/2015 1454   HGB 15.5 11/07/2015 1454   HCT 44.3 12/24/2016 1021   PLT 300.0 11/07/2015 1454   MCV 81 12/24/2016 1021   MCH 27.8 12/24/2016 1021   MCH 28.0 07/18/2012 2030   MCHC 34.3 12/24/2016 1021   MCHC 33.3 11/07/2015 1454   RDW 14.6 12/24/2016 1021   LYMPHSABS 2.7 12/24/2016 1021   MONOABS 0.9 11/07/2015 1454   EOSABS 0.2 12/24/2016 1021   BASOSABS 0.1 12/24/2016 1021   Iron/TIBC/Ferritin/ %Sat No results found for: IRON, TIBC, FERRITIN, IRONPCTSAT Lipid Panel     Component Value Date/Time   CHOL 198 12/24/2016 1021   TRIG 80 12/24/2016 1021  HDL 37 (L) 12/24/2016 1021   CHOLHDL 5 03/17/2015 0841   VLDL 22.8 03/17/2015 0841   LDLCALC 145 (H) 12/24/2016 1021   Hepatic Function Panel     Component Value Date/Time   PROT 7.7 12/24/2016 1021   ALBUMIN 4.6 12/24/2016 1021   AST 15 12/24/2016 1021   ALT 25 12/24/2016 1021   ALKPHOS 68 12/24/2016 1021   BILITOT 0.3 12/24/2016 1021   BILIDIR 0.1 11/07/2015 1454      Component Value Date/Time   TSH 1.370 12/24/2016 1021   TSH 1.39 03/17/2015 0841   TSH 1.58 03/02/2014 0911    ASSESSMENT AND PLAN: Morbid obesity (HCC)  Essential hypertension - Plan: diltiazem (CARDIZEM LA) 240 MG 24 hr tablet  Other hyperlipidemia  Insulin resistance  Vitamin D deficiency - Plan: Vitamin D, Ergocalciferol, (DRISDOL) 50000 units CAPS capsule  At risk for diabetes mellitus  PLAN:  Hypertension We discussed sodium restriction, working on healthy weight loss, and a regular exercise program as the means to achieve improved blood pressure control. We will re-check blood pressure in 2 weeks. Dartanion agreed with this plan and agreed to follow up as directed. We will continue to monitor his blood pressure as well as his progress with the above lifestyle modifications. He agrees to start to take Cardizem LA 240 mg every day #30 with no  refills and will watch for signs of hypotension as he continues his lifestyle modifications with the goal to discontinue this medication when Yonathan loses weight and no longer needs.  Hyperlipidemia Parke was informed of the American Heart Association Guidelines emphasizing intensive lifestyle modifications as the first line treatment for hyperlipidemia. We discussed many lifestyle modifications today in depth, and Jeffry will continue to work on decreasing saturated fats such as fatty red meat, butter and many fried foods. He will also increase vegetables and lean protein in his diet and continue to work on exercise and weight loss efforts. We will re-check labs in 3 months and follow.  Vitamin D Deficiency Jessen was informed that low vitamin D levels contributes to fatigue and are associated with obesity, breast, and colon cancer. He agrees to start to take prescription Vit D @50 ,000 IU every week #4 with no refills and will re-check labs in 3 months and will follow up for routine testing of vitamin D, at least 2-3 times per year. He was informed of the risk of over-replacement of vitamin D and agrees to not increase his dose unless he discusses this with [name] first.  Insulin Resistance Parmvir will continue to work on weight loss, exercise, and decreasing simple carbohydrates in his diet to help decrease the risk of diabetes. We dicussed metformin including benefits and risks. He was informed that eating too many simple carbohydrates or too many calories at one sitting increases the likelihood of GI side effects. Ferguson declined metformin for now and prescription was not written today. We will re-check labs in 3 months. Corran agreed to follow up with [name] as directed to monitor his progress.  Diabetes risk counselling Joran was given extended (at least 30 minutes) diabetes prevention counseling today. He is 46 y.o. male and has risk factors for diabetes including obesity. We discussed intensive  lifestyle modifications today with an emphasis on weight loss as well as increasing exercise and decreasing simple carbohydrates in his diet.  Obesity Tramon is currently in the action stage of change. As such, his goal is to continue with weight loss efforts He has agreed to keep a  food journal with 400-600 calories and 35+ grams of protein daily and follow the Category 3 plan Hawthorne has been instructed to work up to a goal of 150 minutes of combined cardio and strengthening exercise per week or 30 minutes 5 times per week for weight loss and overall health benefits. We discussed the following Behavioral Modification Stratagies today: increasing lean protein intake, decreasing simple carbohydrates , increasing vegetables, decrease eating out and work on meal planning and easy cooking plans  Kenzo has agreed to follow up with our clinic in 2 weeks. He was informed of the importance of frequent follow up visits to maximize his success with intensive lifestyle modifications for his multiple health conditions.  I, Nevada Crane, am acting as scribe for Quillian Quince, MD  I have reviewed the above documentation for accuracy and completeness, and I agree with the above. -Quillian Quince, MD

## 2017-01-20 ENCOUNTER — Encounter (INDEPENDENT_AMBULATORY_CARE_PROVIDER_SITE_OTHER): Payer: Self-pay

## 2017-01-20 ENCOUNTER — Ambulatory Visit (INDEPENDENT_AMBULATORY_CARE_PROVIDER_SITE_OTHER): Payer: 59 | Admitting: Family Medicine

## 2017-01-20 VITALS — BP 142/84 | HR 64 | Temp 97.8°F | Ht 67.0 in | Wt 298.0 lb

## 2017-01-20 DIAGNOSIS — Z6841 Body Mass Index (BMI) 40.0 and over, adult: Secondary | ICD-10-CM | POA: Diagnosis not present

## 2017-01-20 DIAGNOSIS — E669 Obesity, unspecified: Secondary | ICD-10-CM

## 2017-01-20 DIAGNOSIS — IMO0001 Reserved for inherently not codable concepts without codable children: Secondary | ICD-10-CM

## 2017-01-20 DIAGNOSIS — I10 Essential (primary) hypertension: Secondary | ICD-10-CM | POA: Diagnosis not present

## 2017-01-21 NOTE — Progress Notes (Signed)
Office: [phone]  /  Fax: [phone]   HPI:   Chief Complaint: OBESITY Daniel [name] is here to discuss his progress with his obesity treatment plan. He is following his eating plan approximately 85 to 90 % of the time and states he is exercising 0 minutes 0 times per week. Daniel [name] continues to do well with weight loss. His spouse has started to eat healthy with him. Hunger is controlled overall and he is able to eat all his food. His weight is 298 lb (135.2 kg) today and has had a weight loss of 5 pounds over a period of 2 weeks since his last visit. He has lost 11 lbs since starting treatment with [name].  Hypertension Daniel [name] is a 46 y.o. male with hypertension. He is currently on Cardizem LA. His blood pressure is starting to improve with diet but not yet at goal. [name] denies chest pain or shortness of breath on exertion. He is working weight loss to help control his blood pressure with the goal of decreasing his risk of heart attack and stroke. [name] blood pressure is not currently controlled.   Wt Readings from Last 500 Encounters:  01/20/17 298 lb (135.2 kg)  01/07/17 (!) 303 lb (137.4 kg)  12/27/16 (!) 310 lb (140.6 kg)  12/24/16 (!) 309 lb (140.2 kg)  02/01/16 (!) 315 lb (142.9 kg)  12/19/15 (!) 315 lb (142.9 kg)  11/07/15 (!) 312 lb (141.5 kg)  03/22/15 (!) 308 lb (139.7 kg)  02/03/15 (!) 303 lb (137.4 kg)  05/19/14 299 lb (135.6 kg)  03/07/14 298 lb (135.2 kg)  12/22/13 298 lb (135.2 kg)  03/03/13 292 lb (132.5 kg)  07/18/12 285 lb (129.3 kg)  09/11/11 287 lb (130.2 kg)  06/13/11 278 lb (126.1 kg)  06/11/11 288 lb (130.6 kg)  11/02/10 (!) 288 lb (130.6 kg)  05/14/10 (!) 275 lb (124.7 kg)  10/26/08 (!) 281 lb (127.5 kg)  07/22/08 (!) 270 lb (122.5 kg)     ALLERGIES: Allergies  Allergen Reactions  . Bee Venom Anaphylaxis  . Shellfish Allergy Anaphylaxis    MEDICATIONS: Current Outpatient Prescriptions on File Prior to Visit  Medication Sig  Dispense Refill  . albuterol (VENTOLIN HFA) 108 (90 Base) MCG/ACT inhaler INHALE 2 PUFFS INTO THE LUNGS EVERY 4 HOURS AS NEEDED FOR WHEEZING OR SHORTNESS OF BREATH. 18 g 5  . diltiazem (CARDIZEM LA) 240 MG 24 hr tablet Take 1 tablet (240 mg total) by mouth daily. 30 tablet 0  . EPINEPHrine (EPIPEN 2-PAK) 0.3 mg/0.3 mL SOAJ injection Inject 0.3 mLs (0.3 mg total) into the muscle once. 1 Device 11  . levocetirizine (XYZAL) 5 MG tablet Take 1 tablet (5 mg total) by mouth every evening. 90 tablet 3  . montelukast (SINGULAIR) 10 MG tablet Take 1 tablet (10 mg total) by mouth at bedtime. 90 tablet 3  . Multiple Vitamin (MULTIVITAMIN WITH MINERALS) TABS Take 1 tablet by mouth daily.    [name] omeprazole (PRILOSEC) 20 MG capsule TAKE 1 CAPSULE BY MOUTH ONCE DAILY 90 capsule 0  . Polyvinyl Alcohol-Povidone (CLEAR EYES ALL SEASONS) 5-6 MG/ML SOLN Apply 1 drop to eye once as needed. Reported on 02/01/2016    . Vitamin D, Ergocalciferol, (DRISDOL) 50000 units CAPS capsule Take 1 capsule (50,000 Units total) by mouth every 7 (seven) days. 4 capsule 0   No current facility-administered medications on file prior to visit.     PAST MEDICAL HISTORY: Past Medical History:  Diagnosis Date  .  Asthma   . GERD (gastroesophageal reflux disease)   . Hypertension     PAST SURGICAL HISTORY: No past surgical history on file.  SOCIAL HISTORY: Social History  Substance Use Topics  . Smoking status: Never Smoker  . Smokeless tobacco: Never Used  . Alcohol use No    FAMILY HISTORY: Family History  Problem Relation Age of Onset  . Hyperlipidemia    . Hypertension    . Stroke    . Hypertension Mother   . Hyperlipidemia Mother   . Obesity Mother   . Sudden death Father     ROS: Review of Systems  Constitutional: Positive for weight loss.  Respiratory: Negative for shortness of breath (on exertion).   Cardiovascular: Negative for chest pain.    PHYSICAL EXAM: Blood pressure (!) 142/84, pulse 64,  temperature 97.8 F (36.6 C), temperature source Oral, height 5\' 7"  (1.702 m), weight 298 lb (135.2 kg), SpO2 94 %. Body mass index is 46.67 kg/m. Physical Exam  Constitutional: He is oriented to person, place, and time. He appears well-developed and well-nourished.  Cardiovascular: Normal rate.   Pulmonary/Chest: Effort normal.  Musculoskeletal: Normal range of motion.  Neurological: He is oriented to person, place, and time.  Skin: Skin is warm and dry.  Psychiatric: He has a normal mood and affect. His behavior is normal.  Vitals reviewed.   RECENT LABS AND TESTS: BMET    Component Value Date/Time   NA 141 12/24/2016 1021   K 4.8 12/24/2016 1021   CL 101 12/24/2016 1021   CO2 22 12/24/2016 1021   GLUCOSE 80 12/24/2016 1021   GLUCOSE 103 (H) 11/07/2015 1454   BUN 13 12/24/2016 1021   CREATININE 1.09 12/24/2016 1021   CALCIUM 9.4 12/24/2016 1021   GFRNONAA 83 12/24/2016 1021   GFRAA 96 12/24/2016 1021   Lab Results  Component Value Date   HGBA1C 5.5 12/24/2016   Lab Results  Component Value Date   INSULIN 25.8 (H) 12/24/2016   CBC    Component Value Date/Time   WBC 6.6 12/24/2016 1021   WBC 10.2 11/07/2015 1454   RBC 5.47 12/24/2016 1021   RBC 5.67 11/07/2015 1454   HGB 15.5 11/07/2015 1454   HCT 44.3 12/24/2016 1021   PLT 300.0 11/07/2015 1454   MCV 81 12/24/2016 1021   MCH 27.8 12/24/2016 1021   MCH 28.0 07/18/2012 2030   MCHC 34.3 12/24/2016 1021   MCHC 33.3 11/07/2015 1454   RDW 14.6 12/24/2016 1021   LYMPHSABS 2.7 12/24/2016 1021   MONOABS 0.9 11/07/2015 1454   EOSABS 0.2 12/24/2016 1021   BASOSABS 0.1 12/24/2016 1021   Iron/TIBC/Ferritin/ %Sat No results found for: IRON, TIBC, FERRITIN, IRONPCTSAT Lipid Panel     Component Value Date/Time   CHOL 198 12/24/2016 1021   TRIG 80 12/24/2016 1021   HDL 37 (L) 12/24/2016 1021   CHOLHDL 5 03/17/2015 0841   VLDL 22.8 03/17/2015 0841   LDLCALC 145 (H) 12/24/2016 1021   Hepatic Function Panel       Component Value Date/Time   PROT 7.7 12/24/2016 1021   ALBUMIN 4.6 12/24/2016 1021   AST 15 12/24/2016 1021   ALT 25 12/24/2016 1021   ALKPHOS 68 12/24/2016 1021   BILITOT 0.3 12/24/2016 1021   BILIDIR 0.1 11/07/2015 1454      Component Value Date/Time   TSH 1.370 12/24/2016 1021   TSH 1.39 03/17/2015 0841   TSH 1.58 03/02/2014 0911    ASSESSMENT AND PLAN: Hypertension, unspecified  type  Class 3 obesity without serious comorbidity with body mass index (BMI) of 45.0 to 49.9 in adult, unspecified obesity type (HCC)  PLAN:  Hypertension We discussed sodium restriction, working on healthy weight loss, and a regular exercise program as the means to achieve improved blood pressure control. Kaylem agreed with this plan and agreed to follow up as directed. We will continue to monitor his blood pressure as well as his progress with the above lifestyle modifications. He will continue his medications as prescribed and we will re-check blood pressure at his next visit with goal to control blood pressure without extra medications. We will watch for signs of hypotension as he continues his lifestyle modifications.  We spent > than 50% of the 15 minute visit on the counseling as documented in the note.  Obesity Donavyn is currently in the action stage of change. As such, his goal is to continue with weight loss efforts He has agreed to keep a food journal with 400 to 600 calories and 35+ grams of protein at lunch daily and follow the Category 3 plan + 100 calories Toni has been instructed to work up to a goal of 150 minutes of combined cardio and strengthening exercise per week for weight loss and overall health benefits. We discussed the following Behavioral Modification Stratagies today: increasing lean protein intake, increasing vegetables and dealing with family or coworker sabotage  Bastion has agreed to follow up with our clinic in 2 weeks. He was informed of the importance of frequent  follow up visits to maximize his success with intensive lifestyle modifications for his multiple health conditions.  I, [place] Crane, am acting as scribe for Quillian Quince, MD  I have reviewed the above documentation for accuracy and completeness, and I agree with the above. -Quillian Quince, MD

## 2017-01-27 ENCOUNTER — Ambulatory Visit (INDEPENDENT_AMBULATORY_CARE_PROVIDER_SITE_OTHER): Payer: 59 | Admitting: Family Medicine

## 2017-01-28 ENCOUNTER — Encounter (INDEPENDENT_AMBULATORY_CARE_PROVIDER_SITE_OTHER): Payer: Self-pay | Admitting: Family Medicine

## 2017-02-03 ENCOUNTER — Ambulatory Visit (INDEPENDENT_AMBULATORY_CARE_PROVIDER_SITE_OTHER): Payer: 59 | Admitting: Family Medicine

## 2017-02-03 ENCOUNTER — Encounter (INDEPENDENT_AMBULATORY_CARE_PROVIDER_SITE_OTHER): Payer: Self-pay

## 2017-02-04 ENCOUNTER — Ambulatory Visit (INDEPENDENT_AMBULATORY_CARE_PROVIDER_SITE_OTHER): Payer: 59 | Admitting: Family Medicine

## 2017-02-04 VITALS — BP 147/84 | HR 65 | Temp 98.1°F | Ht 67.0 in | Wt 298.0 lb

## 2017-02-04 DIAGNOSIS — E559 Vitamin D deficiency, unspecified: Secondary | ICD-10-CM

## 2017-02-04 DIAGNOSIS — I10 Essential (primary) hypertension: Secondary | ICD-10-CM | POA: Diagnosis not present

## 2017-02-04 DIAGNOSIS — Z9189 Other specified personal risk factors, not elsewhere classified: Secondary | ICD-10-CM

## 2017-02-04 MED ORDER — VITAMIN D (ERGOCALCIFEROL) 1.25 MG (50000 UNIT) PO CAPS: 50000.0000 [IU] | ORAL_CAPSULE | ORAL | 0 refills | Status: DC

## 2017-02-04 MED ORDER — CHLORTHALIDONE 25 MG PO TABS: 25.0000 mg | ORAL_TABLET | Freq: Every day | ORAL | 0 refills | Status: DC

## 2017-02-04 MED ORDER — DILTIAZEM HCL ER COATED BEADS 240 MG PO TB24: 240.0000 mg | ORAL_TABLET | Freq: Every day | ORAL | 0 refills | Status: DC

## 2017-02-04 MED FILL — CHLORTHALIDONE 25 MG TABLET: 25 | 30 days supply | Qty: 30 | Fill #0

## 2017-02-04 MED FILL — DILTIAZEM ER 240 MG TABLET: 240 | 30 days supply | Qty: 30 | Fill #0

## 2017-02-04 MED FILL — VIT D2 1.25 MG (50,000 UNIT: 1.25 MG | 28 days supply | Qty: 4 | Fill #0

## 2017-02-04 NOTE — Progress Notes (Signed)
Office: [phone]  /  Fax: [phone]   HPI:   Chief Complaint: OBESITY Daniel [name] is here to discuss his progress with his obesity treatment plan. He is following his eating plan approximately 85 % of the time and states he is exercising 30 minutes 5 times per week. Daniel [name] has done well maintaining his weight. He has increased eating out with friends for lunch. He is still struggling with planning meals ahead of time. 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  His weight is 298 lb (135.2 kg) today and has maintained weight over a period of 2 weeks since his last visit. He has lost 11 lbs since starting treatment with [name].  Vitamin D deficiency Daniel [name] has a diagnosis of vitamin D deficiency. He is currently stable on vitamin D, not yet at goal. He denies nausea, vomiting or muscle weakness.  Hypertension Daniel [name] is a 46 y.o. male with hypertension.  Daniel [name] denies chest pain or shortness of breath on exertion. He is doing well with diet and exercise. He is working weight loss and working on decreasing sodium to help control his blood pressure with the goal of decreasing his risk of heart attack and stroke. Daniel [name] blood pressure is still not at goal on Cardizem and is not currently controlled. He is sleeping well and denies snoring.  Wt Readings from Last 500 Encounters:  02/04/17 298 lb (135.2 kg)  01/20/17 298 lb (135.2 kg)  01/07/17 (!) 303 lb (137.4 kg)  12/27/16 (!) 310 lb (140.6 kg)  12/24/16 (!) 309 lb (140.2 kg)  02/01/16 (!) 315 lb (142.9 kg)  12/19/15 (!) 315 lb (142.9 kg)  11/07/15 (!) 312 lb (141.5 kg)  03/22/15 (!) 308 lb (139.7 kg)  02/03/15 (!) 303 lb (137.4 kg)  05/19/14 299 lb (135.6 kg)  03/07/14 298 lb (135.2 kg)  12/22/13 298 lb (135.2 kg)  03/03/13 292 lb (132.5 kg)  07/18/12 285 lb (129.3 kg)  09/11/11 287 lb (130.2 kg)  06/13/11 278 lb (126.1 kg)  06/11/11 288 lb (130.6 kg)  11/02/10 (!) 288 lb (130.6 kg)  05/14/10 (!) 275 lb (124.7 kg)    10/26/08 (!) 281 lb (127.5 kg)  07/22/08 (!) 270 lb (122.5 kg)     ALLERGIES: Allergies  Allergen Reactions  . Bee Venom Anaphylaxis  . Shellfish Allergy Anaphylaxis    MEDICATIONS: Current Outpatient Prescriptions on File Prior to Visit  Medication Sig Dispense Refill  . albuterol (VENTOLIN HFA) 108 (90 Base) MCG/ACT inhaler INHALE 2 PUFFS INTO THE LUNGS EVERY 4 HOURS AS NEEDED FOR WHEEZING OR SHORTNESS OF BREATH. 18 g 5  . EPINEPHrine (EPIPEN 2-PAK) 0.3 mg/0.3 mL SOAJ injection Inject 0.3 mLs (0.3 mg total) into the muscle once. 1 Device 11  . levocetirizine (XYZAL) 5 MG tablet Take 1 tablet (5 mg total) by mouth every evening. 90 tablet 3  . montelukast (SINGULAIR) 10 MG tablet Take 1 tablet (10 mg total) by mouth at bedtime. 90 tablet 3  . Multiple Vitamin (MULTIVITAMIN WITH MINERALS) TABS Take 1 tablet by mouth daily.    [name] omeprazole (PRILOSEC) 20 MG capsule TAKE 1 CAPSULE BY MOUTH ONCE DAILY 90 capsule 0  . Polyvinyl Alcohol-Povidone (CLEAR EYES ALL SEASONS) 5-6 MG/ML SOLN Apply 1 drop to eye once as needed. Reported on 02/01/2016     No current facility-administered medications on file prior to visit.     PAST MEDICAL HISTORY: Past Medical History:  Diagnosis Date  . Asthma   . GERD (gastroesophageal reflux  disease)   . Hypertension     PAST SURGICAL HISTORY: No past surgical history on file.  SOCIAL HISTORY: Social History  Substance Use Topics  . Smoking status: Never Smoker  . Smokeless tobacco: Never Used  . Alcohol use No    FAMILY HISTORY: Family History  Problem Relation Age of Onset  . Hyperlipidemia    . Hypertension    . Stroke    . Hypertension Mother   . Hyperlipidemia Mother   . Obesity Mother   . Sudden death Father     ROS: Review of Systems  Respiratory: Negative for shortness of breath (on exertion).   Cardiovascular: Negative for chest pain.  Gastrointestinal: Negative for nausea and vomiting.  Musculoskeletal:       Negative  muscle weakness    PHYSICAL EXAM: Blood pressure (!) 147/84, pulse 65, temperature 98.1 F (36.7 C), temperature source Oral, height 5\' 7"  (1.702 m), weight 298 lb (135.2 kg), SpO2 96 %. Body mass index is 46.67 kg/m. Physical Exam  Constitutional: He is oriented to person, place, and time. He appears well-developed and well-nourished.  Cardiovascular: Normal rate.   Pulmonary/Chest: Effort normal.  Musculoskeletal: Normal range of motion.  Neurological: He is oriented to person, place, and time.  Skin: Skin is warm and dry.  Psychiatric: He has a normal mood and affect. His behavior is normal.  Vitals reviewed.   RECENT LABS AND TESTS: BMET    Component Value Date/Time   NA 141 12/24/2016 1021   K 4.8 12/24/2016 1021   CL 101 12/24/2016 1021   CO2 22 12/24/2016 1021   GLUCOSE 80 12/24/2016 1021   GLUCOSE 103 (H) 11/07/2015 1454   BUN 13 12/24/2016 1021   CREATININE 1.09 12/24/2016 1021   CALCIUM 9.4 12/24/2016 1021   GFRNONAA 83 12/24/2016 1021   GFRAA 96 12/24/2016 1021   Lab Results  Component Value Date   HGBA1C 5.5 12/24/2016   Lab Results  Component Value Date   INSULIN 25.8 (H) 12/24/2016   CBC    Component Value Date/Time   WBC 6.6 12/24/2016 1021   WBC 10.2 11/07/2015 1454   RBC 5.47 12/24/2016 1021   RBC 5.67 11/07/2015 1454   HGB 15.5 11/07/2015 1454   HCT 44.3 12/24/2016 1021   PLT 300.0 11/07/2015 1454   MCV 81 12/24/2016 1021   MCH 27.8 12/24/2016 1021   MCH 28.0 07/18/2012 2030   MCHC 34.3 12/24/2016 1021   MCHC 33.3 11/07/2015 1454   RDW 14.6 12/24/2016 1021   LYMPHSABS 2.7 12/24/2016 1021   MONOABS 0.9 11/07/2015 1454   EOSABS 0.2 12/24/2016 1021   BASOSABS 0.1 12/24/2016 1021   Iron/TIBC/Ferritin/ %Sat No results found for: IRON, TIBC, FERRITIN, IRONPCTSAT Lipid Panel     Component Value Date/Time   CHOL 198 12/24/2016 1021   TRIG 80 12/24/2016 1021   HDL 37 (L) 12/24/2016 1021   CHOLHDL 5 03/17/2015 0841   VLDL 22.8  03/17/2015 0841   LDLCALC 145 (H) 12/24/2016 1021   Hepatic Function Panel     Component Value Date/Time   PROT 7.7 12/24/2016 1021   ALBUMIN 4.6 12/24/2016 1021   AST 15 12/24/2016 1021   ALT 25 12/24/2016 1021   ALKPHOS 68 12/24/2016 1021   BILITOT 0.3 12/24/2016 1021   BILIDIR 0.1 11/07/2015 1454      Component Value Date/Time   TSH 1.370 12/24/2016 1021   TSH 1.39 03/17/2015 0841   TSH 1.58 03/02/2014 0911    ASSESSMENT  AND PLAN: Essential hypertension - Plan: diltiazem (CARDIZEM LA) 240 MG 24 hr tablet, chlorthalidone (HYGROTON) 25 MG tablet  Vitamin D deficiency - Plan: Vitamin D, Ergocalciferol, (DRISDOL) 50000 units CAPS capsule  At risk for heart disease  Morbid obesity (HCC)  PLAN:  Vitamin D Deficiency Daniel [name] was informed that low vitamin D levels contributes to fatigue and are associated with obesity, breast, and colon cancer. He agrees to continue to take prescription Vit D @50 ,000 IU every week #4 with no refills and will follow up for routine testing of vitamin D, at least 2-3 times per year. He was informed of the risk of over-replacement of vitamin D and agrees to not increase his dose unless he discusses this with us first.  Hypertension We discussed sodium restriction, working on healthy weight loss, and a regular exercise program as the means to achieve improved blood pressure control. Cristan agreed with this plan and agreed to follow up as directed. We will continue to monitor his blood pressure as well as his progress with the above lifestyle modifications. He agrees to continue to take Cardizem LA 240 mg qd #30 with no refills and start to take Chlorthalidone 25 mg every morning #30 with no refills and will watch for signs of hypotension as he continues his lifestyle modifications.  Obesity Daniel [name] is currently in the action stage of change. As such, his goal is to continue with weight loss efforts He has agreed to keep a food journal with 500 calories  and 35 grams of protein daily and follow the Category 3 plan +100 calories Daniel [name] has been instructed to work up to a goal of 150 minutes of combined cardio and strengthening exercise per week for weight loss and overall health benefits. We discussed the following Behavioral Modification Stratagies today: increasing lean protein intake, decreasing simple carbohydrates , increasing lower sugar fruits and decreasing sodium intake  Daniel [name] has agreed to follow up with our clinic in 2 weeks. He was informed of the importance of frequent follow up visits to maximize his success with intensive lifestyle modifications for his multiple health conditions.  I, [place] CraneJoanne Murray, am acting as scribe for Quillian Quincearen Kamaria Lucia, MD  I have reviewed the above documentation for accuracy and completeness, and I agree with the above. -Quillian Quincearen Triston Lisanti, MD

## 2017-02-05 ENCOUNTER — Encounter (INDEPENDENT_AMBULATORY_CARE_PROVIDER_SITE_OTHER): Payer: Self-pay | Admitting: Family Medicine

## 2017-02-10 ENCOUNTER — Other Ambulatory Visit: Payer: Self-pay | Admitting: Family Medicine

## 2017-02-10 MED FILL — MONTELUKAST SOD 10 MG TAB: 10 | 90 days supply | Qty: 90 | Fill #0

## 2017-02-11 MED FILL — OMEPRAZOLE DR 20 MG CAPSULE: 20 | 90 days supply | Qty: 90 | Fill #0

## 2017-02-12 ENCOUNTER — Encounter (INDEPENDENT_AMBULATORY_CARE_PROVIDER_SITE_OTHER): Payer: Self-pay | Admitting: Family Medicine

## 2017-02-17 ENCOUNTER — Ambulatory Visit (INDEPENDENT_AMBULATORY_CARE_PROVIDER_SITE_OTHER): Payer: 59 | Admitting: Family Medicine

## 2017-02-17 VITALS — BP 144/74 | HR 73 | Temp 98.7°F | Ht 67.0 in | Wt 292.0 lb

## 2017-02-17 DIAGNOSIS — I10 Essential (primary) hypertension: Secondary | ICD-10-CM

## 2017-02-17 NOTE — Progress Notes (Signed)
Office: [phone]  /  Fax: [phone]   HPI:   Chief Complaint: OBESITY Daniel [name] is here to discuss his progress with his obesity treatment plan. Daniel [name] is following his eating plan approximately 90 % of the time and states Daniel [name] is exercising 30 minutes 5 times per week. Daniel [name] continues to do well with weight loss, has had some increased celebration eating but working on portion control, hunger is controlled but is struggling to eat all his dinner. Daniel [name] has increased exercise, weights and cardio. Daniel [name] notes when Daniel [name] is exercising 60 minutes or more, Daniel [name] increases his hunger. His weight is 292 lb (132.5 kg) today and has had a weight loss of 6 pounds over a period of 2 weeks since his last visit. Daniel [name] has lost 17 lbs since starting treatment with [name].  Hypertension Daniel [name] is a 46 y.o. male with hypertension. His blood pressure is slowly improving with weight loss and medications but not as quickly as expected. Daniel [name] denies chest pain, headache or shortness of breath on exertion. Daniel [name] is working weight loss to help control his blood pressure with the goal of decreasing his risk of heart attack and stroke. Daniel [name] blood pressure is not currently controlled.  Wt Readings from Last 500 Encounters:  02/17/17 292 lb (132.5 kg)  02/04/17 298 lb (135.2 kg)  01/20/17 298 lb (135.2 kg)  01/07/17 (!) 303 lb (137.4 kg)  12/27/16 (!) 310 lb (140.6 kg)  12/24/16 (!) 309 lb (140.2 kg)  02/01/16 (!) 315 lb (142.9 kg)  12/19/15 (!) 315 lb (142.9 kg)  11/07/15 (!) 312 lb (141.5 kg)  03/22/15 (!) 308 lb (139.7 kg)  02/03/15 (!) 303 lb (137.4 kg)  05/19/14 299 lb (135.6 kg)  03/07/14 298 lb (135.2 kg)  12/22/13 298 lb (135.2 kg)  03/03/13 292 lb (132.5 kg)  07/18/12 285 lb (129.3 kg)  09/11/11 287 lb (130.2 kg)  06/13/11 278 lb (126.1 kg)  06/11/11 288 lb (130.6 kg)  11/02/10 (!) 288 lb (130.6 kg)  05/14/10 (!) 275 lb (124.7 kg)  10/26/08 (!) 281 lb (127.5 kg)  07/22/08 (!) 270 lb (122.5 kg)       ALLERGIES: Allergies  Allergen Reactions  . Bee Venom Anaphylaxis  . Shellfish Allergy Anaphylaxis    MEDICATIONS: Current Outpatient Prescriptions on File Prior to Visit  Medication Sig Dispense Refill  . albuterol (VENTOLIN HFA) 108 (90 Base) MCG/ACT inhaler INHALE 2 PUFFS INTO THE LUNGS EVERY 4 HOURS AS NEEDED FOR WHEEZING OR SHORTNESS OF BREATH. 18 g 5  . chlorthalidone (HYGROTON) 25 MG tablet Take 1 tablet (25 mg total) by mouth daily. 30 tablet 0  . diltiazem (CARDIZEM LA) 240 MG 24 hr tablet Take 1 tablet (240 mg total) by mouth daily. 30 tablet 0  . EPINEPHrine (EPIPEN 2-PAK) 0.3 mg/0.3 mL SOAJ injection Inject 0.3 mLs (0.3 mg total) into the muscle once. 1 Device 11  . levocetirizine (XYZAL) 5 MG tablet Take 1 tablet (5 mg total) by mouth every evening. 90 tablet 3  . montelukast (SINGULAIR) 10 MG tablet Take 1 tablet (10 mg total) by mouth at bedtime. 90 tablet 3  . Multiple Vitamin (MULTIVITAMIN WITH MINERALS) TABS Take 1 tablet by mouth daily.    [name] omeprazole (PRILOSEC) 20 MG capsule TAKE 1 CAPSULE BY MOUTH ONCE DAILY 90 capsule 3  . Polyvinyl Alcohol-Povidone (CLEAR EYES ALL SEASONS) 5-6 MG/ML SOLN Apply 1 drop to eye once as needed. Reported on 02/01/2016    . Vitamin  D, Ergocalciferol, (DRISDOL) 50000 units CAPS capsule Take 1 capsule (50,000 Units total) by mouth every 7 (seven) days. 4 capsule 0   No current facility-administered medications on file prior to visit.     PAST MEDICAL HISTORY: Past Medical History:  Diagnosis Date  . Asthma   . GERD (gastroesophageal reflux disease)   . Hypertension     PAST SURGICAL HISTORY: No past surgical history on file.  SOCIAL HISTORY: Social History  Substance Use Topics  . Smoking status: Never Smoker  . Smokeless tobacco: Never Used  . Alcohol use No    FAMILY HISTORY: Family History  Problem Relation Age of Onset  . Hyperlipidemia    . Hypertension    . Stroke    . Hypertension Mother   . Hyperlipidemia  Mother   . Obesity Mother   . Sudden death Father     ROS: Review of Systems  Constitutional: Positive for weight loss.  Respiratory: Negative for shortness of breath (on exertion).   Cardiovascular: Negative for chest pain.  Neurological: Negative for headaches.    PHYSICAL EXAM: Blood pressure (!) 144/74, pulse 73, temperature 98.7 F (37.1 C), temperature source Oral, height 5\' 7"  (1.702 m), weight 292 lb (132.5 kg), SpO2 95 %. Body mass index is 45.73 kg/m. Physical Exam  Constitutional: Daniel [name] is oriented to person, place, and time. Daniel [name] appears well-developed and well-nourished.  Cardiovascular: Normal rate.   Pulmonary/Chest: Effort normal.  Musculoskeletal: Normal range of motion.  Neurological: Daniel [name] is oriented to person, place, and time.  Skin: Skin is warm and dry.  Psychiatric: Daniel [name] has a normal mood and affect. His behavior is normal.  Vitals reviewed.   RECENT LABS AND TESTS: BMET    Component Value Date/Time   NA 141 12/24/2016 1021   K 4.8 12/24/2016 1021   CL 101 12/24/2016 1021   CO2 22 12/24/2016 1021   GLUCOSE 80 12/24/2016 1021   GLUCOSE 103 (H) 11/07/2015 1454   BUN 13 12/24/2016 1021   CREATININE 1.09 12/24/2016 1021   CALCIUM 9.4 12/24/2016 1021   GFRNONAA 83 12/24/2016 1021   GFRAA 96 12/24/2016 1021   Lab Results  Component Value Date   HGBA1C 5.5 12/24/2016   Lab Results  Component Value Date   INSULIN 25.8 (H) 12/24/2016   CBC    Component Value Date/Time   WBC 6.6 12/24/2016 1021   WBC 10.2 11/07/2015 1454   RBC 5.47 12/24/2016 1021   RBC 5.67 11/07/2015 1454   HGB 15.5 11/07/2015 1454   HCT 44.3 12/24/2016 1021   PLT 300.0 11/07/2015 1454   MCV 81 12/24/2016 1021   MCH 27.8 12/24/2016 1021   MCH 28.0 07/18/2012 2030   MCHC 34.3 12/24/2016 1021   MCHC 33.3 11/07/2015 1454   RDW 14.6 12/24/2016 1021   LYMPHSABS 2.7 12/24/2016 1021   MONOABS 0.9 11/07/2015 1454   EOSABS 0.2 12/24/2016 1021   BASOSABS 0.1 12/24/2016 1021    Iron/TIBC/Ferritin/ %Sat No results found for: IRON, TIBC, FERRITIN, IRONPCTSAT Lipid Panel     Component Value Date/Time   CHOL 198 12/24/2016 1021   TRIG 80 12/24/2016 1021   HDL 37 (L) 12/24/2016 1021   CHOLHDL 5 03/17/2015 0841   VLDL 22.8 03/17/2015 0841   LDLCALC 145 (H) 12/24/2016 1021   Hepatic Function Panel     Component Value Date/Time   PROT 7.7 12/24/2016 1021   ALBUMIN 4.6 12/24/2016 1021   AST 15 12/24/2016 1021   ALT 25 12/24/2016 1021  ALKPHOS 68 12/24/2016 1021   BILITOT 0.3 12/24/2016 1021   BILIDIR 0.1 11/07/2015 1454      Component Value Date/Time   TSH 1.370 12/24/2016 1021   TSH 1.39 03/17/2015 0841   TSH 1.58 03/02/2014 0911    ASSESSMENT AND PLAN: Essential hypertension  Morbid obesity (HCC)  PLAN:  Hypertension We discussed sodium restriction, working on healthy weight loss, and a regular exercise program as the means to achieve improved blood pressure control. Arihaan agreed with this plan and agreed to follow up as directed. We will continue to monitor his blood pressure as well as his progress with the above lifestyle modifications. If not continued to improve we may need to investigate further. Daniel [name] will continue his medications as prescribed and will watch for signs of hypotension as hecontinues his lifestyle modifications.   We spent > than 50% of the 15 minute visit on the counseling as documented in the note.  Obesity Daniel [name] is currently in the action stage of change. As such, his goal is to continue with weight loss efforts Daniel [name] has agreed to follow the Category 3 plan Daniel [name] has been instructed goal is 150 minutes of combined cardio and strengthening exercise per week for weight loss and overall health benefits. We discussed the following Behavioral Modification Stratagies today: increasing lean protein intake, increasing vegetables, increasing lower sugar fruits and decreasing sodium intake  Daniel [name] has agreed to follow up with our  clinic in 2 weeks. Daniel [name] was informed of the importance of frequent follow up visits to maximize his success with intensive lifestyle modifications for his multiple health conditions.  I, [place] CraneJoanne Murray, am acting as scribe for Quillian Quincearen Jerrie Gullo, MD  I have reviewed the above documentation for accuracy and completeness, and I agree with the above. -Quillian Quincearen Dinh Ayotte, MD

## 2017-03-04 ENCOUNTER — Ambulatory Visit (INDEPENDENT_AMBULATORY_CARE_PROVIDER_SITE_OTHER): Payer: 59 | Admitting: Family Medicine

## 2017-03-04 VITALS — BP 121/76 | HR 70 | Temp 98.3°F | Ht 67.0 in | Wt 295.0 lb

## 2017-03-04 DIAGNOSIS — I10 Essential (primary) hypertension: Secondary | ICD-10-CM | POA: Diagnosis not present

## 2017-03-04 DIAGNOSIS — E559 Vitamin D deficiency, unspecified: Secondary | ICD-10-CM | POA: Diagnosis not present

## 2017-03-04 MED ORDER — CHLORTHALIDONE 25 MG PO TABS: 25.0000 mg | ORAL_TABLET | Freq: Every day | ORAL | 0 refills | Status: DC

## 2017-03-04 MED ORDER — DILTIAZEM HCL ER COATED BEADS 240 MG PO TB24: 240.0000 mg | ORAL_TABLET | Freq: Every day | ORAL | 0 refills | Status: DC

## 2017-03-04 MED ORDER — VITAMIN D (ERGOCALCIFEROL) 1.25 MG (50000 UNIT) PO CAPS: 50000.0000 [IU] | ORAL_CAPSULE | ORAL | 0 refills | Status: DC

## 2017-03-04 MED FILL — CHLORTHALIDONE 25 MG TABLET: 25 | 30 days supply | Qty: 30 | Fill #0

## 2017-03-04 MED FILL — VIT D2 1.25 MG (50,000 UNIT: 1.25 MG | 28 days supply | Qty: 4 | Fill #0

## 2017-03-04 MED FILL — DILTIAZEM ER 240 MG TABLET: 240 | 30 days supply | Qty: 30 | Fill #0

## 2017-03-04 NOTE — Progress Notes (Signed)
Office: [phone]  /  Fax: [phone]   HPI:   Chief Complaint: OBESITY Daniel [name] is here to discuss his progress with his obesity treatment plan. He is following his eating plan approximately 80 % of the time and states he is exercising 45 minutes 5 times per week. [name] has gained 3 lbs of water weight (per bio impendage scale), he feels his weight gain is due to strengthening exercise. He's not drinking much water. His weight is 295 lb (133.8 kg) today and has gained 3 lbs over a period of 2 weeks since his last visit. He has lost 14 lbs since starting treatment with [name].  Vitamin D deficiency [name] has a diagnosis of vitamin D deficiency. He is currently stable on vit D, not yet at goal. He still notes fatigue and denies nausea, vomiting or muscle weakness.  Hypertension [name] is a 46 y.o. male with hypertension.  [name] denies headache, chest pain or shortness of breath on exertion. He is doing well on medications. [name] is working weight loss to help control his blood pressure with the goal of decreasing his risk of heart attack and stroke. [name] blood pressure is now controlled.    Wt Readings from Last 500 Encounters:  03/04/17 295 lb (133.8 kg)  02/17/17 292 lb (132.5 kg)  02/04/17 298 lb (135.2 kg)  01/20/17 298 lb (135.2 kg)  01/07/17 (!) 303 lb (137.4 kg)  12/27/16 (!) 310 lb (140.6 kg)  12/24/16 (!) 309 lb (140.2 kg)  02/01/16 (!) 315 lb (142.9 kg)  12/19/15 (!) 315 lb (142.9 kg)  11/07/15 (!) 312 lb (141.5 kg)  03/22/15 (!) 308 lb (139.7 kg)  02/03/15 (!) 303 lb (137.4 kg)  05/19/14 299 lb (135.6 kg)  03/07/14 298 lb (135.2 kg)  12/22/13 298 lb (135.2 kg)  03/03/13 292 lb (132.5 kg)  07/18/12 285 lb (129.3 kg)  09/11/11 287 lb (130.2 kg)  06/13/11 278 lb (126.1 kg)  06/11/11 288 lb (130.6 kg)  11/02/10 (!) 288 lb (130.6 kg)  05/14/10 (!) 275 lb (124.7 kg)  10/26/08 (!) 281 lb (127.5 kg)  07/22/08 (!) 270 lb (122.5 kg)      ALLERGIES: Allergies  Allergen Reactions  . Bee Venom Anaphylaxis  . Shellfish Allergy Anaphylaxis    MEDICATIONS: Current Outpatient Prescriptions on File Prior to Visit  Medication Sig Dispense Refill  . albuterol (VENTOLIN HFA) 108 (90 Base) MCG/ACT inhaler INHALE 2 PUFFS INTO THE LUNGS EVERY 4 HOURS AS NEEDED FOR WHEEZING OR SHORTNESS OF BREATH. 18 g 5  . EPINEPHrine (EPIPEN 2-PAK) 0.3 mg/0.3 mL SOAJ injection Inject 0.3 mLs (0.3 mg total) into the muscle once. 1 Device 11  . levocetirizine (XYZAL) 5 MG tablet Take 1 tablet (5 mg total) by mouth every evening. 90 tablet 3  . montelukast (SINGULAIR) 10 MG tablet Take 1 tablet (10 mg total) by mouth at bedtime. 90 tablet 3  . Multiple Vitamin (MULTIVITAMIN WITH MINERALS) TABS Take 1 tablet by mouth daily.    [name] omeprazole (PRILOSEC) 20 MG capsule TAKE 1 CAPSULE BY MOUTH ONCE DAILY 90 capsule 3  . Polyvinyl Alcohol-Povidone (CLEAR EYES ALL SEASONS) 5-6 MG/ML SOLN Apply 1 drop to eye once as needed. Reported on 02/01/2016     No current facility-administered medications on file prior to visit.     PAST MEDICAL HISTORY: Past Medical History:  Diagnosis Date  . Asthma   . GERD (gastroesophageal reflux disease)   . Hypertension  PAST SURGICAL HISTORY: No past surgical history on file.  SOCIAL HISTORY: Social History  Substance Use Topics  . Smoking status: Never Smoker  . Smokeless tobacco: Never Used  . Alcohol use No    FAMILY HISTORY: Family History  Problem Relation Age of Onset  . Hyperlipidemia    . Hypertension    . Stroke    . Hypertension Mother   . Hyperlipidemia Mother   . Obesity Mother   . Sudden death Father     ROS: Review of Systems  Constitutional: Positive for malaise/fatigue. Negative for weight loss.  Cardiovascular: Negative for chest pain.  Gastrointestinal: Negative for nausea and vomiting.  Musculoskeletal:       Negative muscle weakness  Neurological: Negative for headaches.     PHYSICAL EXAM: Blood pressure 121/76, pulse 70, temperature 98.3 F (36.8 C), temperature source Oral, height  (1.702 m), weight 295 lb (133.8 kg), SpO2 96 %. Body mass index is 46.2 kg/m. Physical Exam  Constitutional: He is oriented to person, place, and time. He appears well-developed and well-nourished.  Cardiovascular: Normal rate.   Pulmonary/Chest: Effort normal.  Musculoskeletal: Normal range of motion.  Neurological: He is oriented to person, place, and time.  Skin: Skin is warm and dry.  Psychiatric: He has a normal mood and affect. His behavior is normal.  Vitals reviewed.   RECENT LABS AND TESTS: BMET    Component Value Date/Time   NA 141 12/24/2016 1021   K 4.8 12/24/2016 1021   CL 101 12/24/2016 1021   CO2 22 12/24/2016 1021   GLUCOSE 80 12/24/2016 1021   GLUCOSE 103 (H) 11/07/2015 1454   BUN 13 12/24/2016 1021   CREATININE 1.09 12/24/2016 1021   CALCIUM 9.4 12/24/2016 1021   GFRNONAA 83 12/24/2016 1021   GFRAA 96 12/24/2016 1021   Lab Results  Component Value Date   HGBA1C 5.5 12/24/2016   Lab Results  Component Value Date   INSULIN 25.8 (H) 12/24/2016   CBC    Component Value Date/Time   WBC 6.6 12/24/2016 1021   WBC 10.2 11/07/2015 1454   RBC 5.47 12/24/2016 1021   RBC 5.67 11/07/2015 1454   HGB 15.5 11/07/2015 1454   HCT 44.3 12/24/2016 1021   PLT 300.0 11/07/2015 1454   MCV 81 12/24/2016 1021   MCH 27.8 12/24/2016 1021   MCH 28.0 07/18/2012 2030   MCHC 34.3 12/24/2016 1021   MCHC 33.3 11/07/2015 1454   RDW 14.6 12/24/2016 1021   LYMPHSABS 2.7 12/24/2016 1021   MONOABS 0.9 11/07/2015 1454   EOSABS 0.2 12/24/2016 1021   BASOSABS 0.1 12/24/2016 1021   Iron/TIBC/Ferritin/ %Sat No results found for: IRON, TIBC, FERRITIN, IRONPCTSAT Lipid Panel     Component Value Date/Time   CHOL 198 12/24/2016 1021   TRIG 80 12/24/2016 1021   HDL 37 (L) 12/24/2016 1021   CHOLHDL 5 03/17/2015 0841   VLDL 22.8 03/17/2015 0841   LDLCALC 145  (H) 12/24/2016 1021   Hepatic Function Panel     Component Value Date/Time   PROT 7.7 12/24/2016 1021   ALBUMIN 4.6 12/24/2016 1021   AST 15 12/24/2016 1021   ALT 25 12/24/2016 1021   ALKPHOS 68 12/24/2016 1021   BILITOT 0.3 12/24/2016 1021   BILIDIR 0.1 11/07/2015 1454      Component Value Date/Time   TSH 1.370 12/24/2016 1021   TSH 1.39 03/17/2015 0841   TSH 1.58 03/02/2014 0911    ASSESSMENT AND PLAN: Essential hypertension - Plan:  chlorthalidone (HYGROTON) 25 MG tablet, diltiazem (CARDIZEM LA) 240 MG 24 hr tablet  Vitamin D deficiency - Plan: Vitamin D, Ergocalciferol, (DRISDOL) 50000 units CAPS capsule  Morbid obesity (HCC)  PLAN:  Vitamin D Deficiency Laban was informed that low vitamin D levels contributes to fatigue and are associated with obesity, breast, and colon cancer. He agrees to continue to take prescription Vit D ,000 IU every week, we will refill for 1 month and will follow up for routine testing of vitamin D, at least 2-3 times per year. He was informed of the risk of over-replacement of vitamin D and agrees to not increase his dose unless he discusses this with [name] first. Tamer agrees to follow up with our clinic in 3 weeks.  Hypertension We discussed sodium restriction, working on healthy weight loss, and a regular exercise program as the means to achieve improved blood pressure control. Zaiah agreed with this plan and agreed to follow up as directed. We will continue to monitor his blood pressure as well as his progress with the above lifestyle modifications. He will continue his medications as prescribed and will watch for signs of hypotension as he continues his lifestyle modifications. We will refill Chlorthalidone and Diltiazem for 1 month and Merrik agrees to follow up with our clinic in 3 weeks.  Obesity Che is currently in the action stage of change. As such, his goal is to continue with weight loss efforts He has agreed to follow the  Category 3 plan Espn has been instructed to work up to a goal of 150 minutes of combined cardio and strengthening exercise per week or continue with weights 45 minutes 5 times per week for weight loss and overall health benefits. We discussed the following Behavioral Modification Stratagies today: increasing H2O, increasing lean protein intake, decreasing simple carbohydrates , decreasing sodium intake, work on meal planning and easy cooking plans, dealing with family or coworker sabotage and holiday eating strategies.   Shown has agreed to follow up with our clinic in 3 weeks. He was informed of the importance of frequent follow up visits to maximize his success with intensive lifestyle modifications for his multiple health conditions.  I, Nevada Crane, am acting as scribe for Quillian Quince, MD  I have reviewed the above documentation for accuracy and completeness, and I agree with the above. -Quillian Quince, MD

## 2017-03-25 ENCOUNTER — Ambulatory Visit (INDEPENDENT_AMBULATORY_CARE_PROVIDER_SITE_OTHER): Payer: 59 | Admitting: Family Medicine

## 2017-03-25 VITALS — BP 124/73 | HR 74 | Temp 98.7°F | Ht 67.0 in | Wt 294.0 lb

## 2017-03-25 DIAGNOSIS — I10 Essential (primary) hypertension: Secondary | ICD-10-CM | POA: Diagnosis not present

## 2017-03-25 DIAGNOSIS — E785 Hyperlipidemia, unspecified: Secondary | ICD-10-CM

## 2017-03-25 DIAGNOSIS — E8881 Metabolic syndrome: Secondary | ICD-10-CM

## 2017-03-25 DIAGNOSIS — E559 Vitamin D deficiency, unspecified: Secondary | ICD-10-CM

## 2017-03-25 DIAGNOSIS — Z9189 Other specified personal risk factors, not elsewhere classified: Secondary | ICD-10-CM

## 2017-03-25 DIAGNOSIS — E7849 Other hyperlipidemia: Secondary | ICD-10-CM | POA: Insufficient documentation

## 2017-03-25 MED ORDER — DILTIAZEM HCL ER COATED BEADS 240 MG PO TB24: 240.0000 mg | ORAL_TABLET | Freq: Every day | ORAL | 0 refills | Status: DC

## 2017-03-25 MED ORDER — CHLORTHALIDONE 25 MG PO TABS: 25.0000 mg | ORAL_TABLET | Freq: Every day | ORAL | 0 refills | Status: DC

## 2017-03-25 MED ORDER — VITAMIN D (ERGOCALCIFEROL) 1.25 MG (50000 UNIT) PO CAPS: 50000.0000 [IU] | ORAL_CAPSULE | ORAL | 0 refills | Status: DC
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
## 2017-04-01 ENCOUNTER — Other Ambulatory Visit (INDEPENDENT_AMBULATORY_CARE_PROVIDER_SITE_OTHER): Payer: Self-pay | Admitting: Family Medicine

## 2017-04-01 ENCOUNTER — Encounter (INDEPENDENT_AMBULATORY_CARE_PROVIDER_SITE_OTHER): Payer: Self-pay | Admitting: Family Medicine

## 2017-04-01 DIAGNOSIS — I10 Essential (primary) hypertension: Secondary | ICD-10-CM

## 2017-04-01 DIAGNOSIS — E559 Vitamin D deficiency, unspecified: Secondary | ICD-10-CM

## 2017-04-03 MED FILL — CHLORTHALIDONE 25 MG TABLET: 25 | 30 days supply | Qty: 30 | Fill #0

## 2017-04-03 MED FILL — DILTIAZEM ER 240 MG TABLET: 240 | 30 days supply | Qty: 30 | Fill #0

## 2017-04-03 MED FILL — VIT D2 1.25 MG (50,000 UNIT: 1.25 MG | 28 days supply | Qty: 4 | Fill #0

## 2017-04-08 ENCOUNTER — Other Ambulatory Visit (INDEPENDENT_AMBULATORY_CARE_PROVIDER_SITE_OTHER): Payer: Self-pay | Admitting: Family Medicine

## 2017-04-08 DIAGNOSIS — E559 Vitamin D deficiency, unspecified: Secondary | ICD-10-CM | POA: Diagnosis not present

## 2017-04-08 DIAGNOSIS — E8881 Metabolic syndrome: Secondary | ICD-10-CM | POA: Diagnosis not present

## 2017-04-08 DIAGNOSIS — E785 Hyperlipidemia, unspecified: Secondary | ICD-10-CM | POA: Diagnosis not present

## 2017-04-09 LAB — COMPREHENSIVE METABOLIC PANEL WITH GFR
ALT: 25 IU/L (ref 0–44)
AST: 18 IU/L (ref 0–40)
Albumin/Globulin Ratio: 1.3 (ref 1.2–2.2)
Albumin: 4.3 g/dL (ref 3.5–5.5)
Alkaline Phosphatase: 59 IU/L (ref 39–117)
BUN/Creatinine Ratio: 15 (ref 9–20)
BUN: 18 mg/dL (ref 6–24)
Bilirubin Total: 0.4 mg/dL (ref 0.0–1.2)
CO2: 26 mmol/L (ref 18–29)
Calcium: 9.7 mg/dL (ref 8.7–10.2)
Chloride: 102 mmol/L (ref 96–106)
Creatinine, Ser: 1.18 mg/dL (ref 0.76–1.27)
GFR calc Af Amer: 87 mL/min/1.73
GFR calc non Af Amer: 75 mL/min/1.73
Globulin, Total: 3.3 g/dL (ref 1.5–4.5)
Glucose: 88 mg/dL (ref 65–99)
Potassium: 3.8 mmol/L (ref 3.5–5.2)
Sodium: 143 mmol/L (ref 134–144)
Total Protein: 7.6 g/dL (ref 6.0–8.5)

## 2017-04-09 LAB — LIPID PANEL WITH LDL/HDL RATIO
CHOLESTEROL TOTAL: 204 mg/dL — AB (ref 100–199)
HDL: 37 mg/dL — ABNORMAL LOW (ref >39)
LDL Calculated: 149 mg/dL — ABNORMAL HIGH (ref 0–99)
LDL/HDL RATIO: 4 ratio — AB (ref 0.0–3.6)
TRIGLYCERIDES: 88 mg/dL (ref 0–149)
VLDL Cholesterol Cal: 18 mg/dL (ref 5–40)

## 2017-04-09 LAB — VITAMIN D 25 HYDROXY (VIT D DEFICIENCY, FRACTURES): VIT D 25 HYDROXY: 49.3 ng/mL (ref 30.0–100.0)

## 2017-04-09 LAB — HEMOGLOBIN A1C
Est. average glucose Bld gHb Est-mCnc: 114 mg/dL
Hgb A1c MFr Bld: 5.6 % (ref 4.8–5.6)

## 2017-04-09 LAB — INSULIN, RANDOM: INSULIN: 28.9 u[IU]/mL — AB (ref 2.6–24.9)

## 2017-04-15 ENCOUNTER — Ambulatory Visit (INDEPENDENT_AMBULATORY_CARE_PROVIDER_SITE_OTHER): Payer: 59 | Admitting: Family Medicine

## 2017-04-15 VITALS — BP 124/77 | HR 77 | Temp 98.8°F | Ht 67.0 in | Wt 292.0 lb

## 2017-04-15 DIAGNOSIS — E782 Mixed hyperlipidemia: Secondary | ICD-10-CM

## 2017-04-15 DIAGNOSIS — R7303 Prediabetes: Secondary | ICD-10-CM | POA: Diagnosis not present

## 2017-04-15 DIAGNOSIS — Z9189 Other specified personal risk factors, not elsewhere classified: Secondary | ICD-10-CM | POA: Diagnosis not present

## 2017-04-15 DIAGNOSIS — E559 Vitamin D deficiency, unspecified: Secondary | ICD-10-CM

## 2017-04-15 MED ORDER — VITAMIN D (ERGOCALCIFEROL) 1.25 MG (50000 UNIT) PO CAPS: 50000.0000 [IU] | ORAL_CAPSULE | ORAL | 0 refills | Status: DC

## 2017-04-15 MED ORDER — METFORMIN HCL 500 MG PO TABS: 500.0000 mg | ORAL_TABLET | Freq: Every morning | ORAL | 0 refills | Status: DC

## 2017-04-15 MED FILL — metFORMIN HCL 500 MG TABS: 500 | 30 days supply | Qty: 30 | Fill #0

## 2017-04-15 NOTE — Progress Notes (Signed)
Office: [phone]  /  Fax: [phone]   HPI:  Chief Complaint: OBESITY Daniel [name] is here to discuss his progress with his obesity treatment plan. He is on the  follow the Category 3 plan and is following his eating plan approximately 90 % of the time. He states he is exercising at the gym 45 minutes 7 times per week. [name] continues to do well with weight loss, tolerating eating plan well.  His weight is 292 lb (132.5 kg) today and has had a weight loss of 2 pounds over a period of 2 weeks since his last visit. He has lost 17 lbs since starting treatment with [name].  Pre-Diabetes Daniel [name] has a diagnosis of prediabetes based on his elevated HgA1c and was informed this puts him at greater risk of developing diabetes. A1C slightly worse even with diet and weight loss. He still notes polyphagia. He is not taking metformin currently and continues to work on diet and exercise to decrease risk of diabetes. He denies nausea or hypoglycemia.  Vitamin D deficiency Daniel [name] has a diagnosis of vitamin D deficiency.  He is currently taking vit D, numbers greatly improved and almost at goal. He denies nausea, vomiting or muscle weakness.  Hyperlipidemia Daniel [name] has hyperlipidemia and has been trying to improve his cholesterol levels with intensive lifestyle modification including a low saturated fat diet, exercise and weight loss. He denies any chest pain, claudication or myalgias.  At risk for diabetes Daniel [name] is at higher than averagerisk for developing diabetes due to his obesity. He admits polyphagia, denies polyuria or polydipsia.    ALLERGIES: Allergies  Allergen Reactions  . Bee Venom Anaphylaxis  . Shellfish Allergy Anaphylaxis    MEDICATIONS: Current Outpatient Prescriptions on File Prior to Visit  Medication Sig Dispense Refill  . albuterol (VENTOLIN HFA) 108 (90 Base) MCG/ACT inhaler INHALE 2 PUFFS INTO THE LUNGS EVERY 4 HOURS AS NEEDED FOR WHEEZING OR SHORTNESS OF BREATH. 18 g 5  .  chlorthalidone (HYGROTON) 25 MG tablet Take 1 tablet (25 mg total) by mouth daily. 30 tablet 0  . diltiazem (CARDIZEM LA) 240 MG 24 hr tablet Take 1 tablet (240 mg total) by mouth daily. 30 tablet 0  . EPINEPHrine (EPIPEN 2-PAK) 0.3 mg/0.3 mL SOAJ injection Inject 0.3 mLs (0.3 mg total) into the muscle once. 1 Device 11  . levocetirizine (XYZAL) 5 MG tablet Take 1 tablet (5 mg total) by mouth every evening. 90 tablet 3  . montelukast (SINGULAIR) 10 MG tablet Take 1 tablet (10 mg total) by mouth at bedtime. 90 tablet 3  . Multiple Vitamin (MULTIVITAMIN WITH MINERALS) TABS Take 1 tablet by mouth daily.    [name] omeprazole (PRILOSEC) 20 MG capsule TAKE 1 CAPSULE BY MOUTH ONCE DAILY 90 capsule 3  . Polyvinyl Alcohol-Povidone (CLEAR EYES ALL SEASONS) 5-6 MG/ML SOLN Apply 1 drop to eye once as needed. Reported on 02/01/2016    . Vitamin D, Ergocalciferol, (DRISDOL) 50000 units CAPS capsule Take 1 capsule (50,000 Units total) by mouth every 7 (seven) days. 4 capsule 0   No current facility-administered medications on file prior to visit.     PAST MEDICAL HISTORY: Past Medical History:  Diagnosis Date  . Asthma   . GERD (gastroesophageal reflux disease)   . Hypertension     PAST SURGICAL HISTORY: No past surgical history on file.  SOCIAL HISTORY: Social History  Substance Use Topics  . Smoking status: Never Smoker  . Smokeless tobacco: Never Used  . Alcohol use No  FAMILY HISTORY: Family History  Problem Relation Age of Onset  . Hyperlipidemia Unknown   . Hypertension Unknown   . Stroke Unknown   . Hypertension Mother   . Hyperlipidemia Mother   . Obesity Mother   . Sudden death Father     ROS: Review of Systems  Constitutional: Positive for weight loss.  Cardiovascular: Negative for chest pain.  Endo/Heme/Allergies: Negative for polydipsia.       Negative for polyuria Positive for polyphagia    PHYSICAL EXAM: Blood pressure 124/77, pulse 77, temperature 98.8 F (37.1 C),  temperature source Oral, height 5\' 7"  (1.702 m), weight 292 lb (132.5 kg), SpO2 98 %. Body mass index is 45.73 kg/m. Physical Exam  Constitutional: He is oriented to person, place, and time. He appears well-developed and well-nourished.  Cardiovascular: Normal rate.   Pulmonary/Chest: Effort normal.  Musculoskeletal: Normal range of motion.  Neurological: He is alert and oriented to person, place, and time.  Skin: Skin is warm and dry.    RECENT LABS AND TESTS: BMET    Component Value Date/Time   NA 143 04/08/2017 0934   K 3.8 04/08/2017 0934   CL 102 04/08/2017 0934   CO2 26 04/08/2017 0934   GLUCOSE 88 04/08/2017 0934   GLUCOSE 103 (H) 11/07/2015 1454   BUN 18 04/08/2017 0934   CREATININE 1.18 04/08/2017 0934   CALCIUM 9.7 04/08/2017 0934   GFRNONAA 75 04/08/2017 0934   GFRAA 87 04/08/2017 0934   Lab Results  Component Value Date   HGBA1C 5.6 04/08/2017   HGBA1C 5.5 12/24/2016   Lab Results  Component Value Date   INSULIN 28.9 (H) 04/08/2017   INSULIN 25.8 (H) 12/24/2016   CBC    Component Value Date/Time   WBC 6.6 12/24/2016 1021   WBC 10.2 11/07/2015 1454   RBC 5.47 12/24/2016 1021   RBC 5.67 11/07/2015 1454   HGB 15.5 11/07/2015 1454   HCT 44.3 12/24/2016 1021   PLT 300.0 11/07/2015 1454   MCV 81 12/24/2016 1021   MCH 27.8 12/24/2016 1021   MCH 28.0 07/18/2012 2030   MCHC 34.3 12/24/2016 1021   MCHC 33.3 11/07/2015 1454   RDW 14.6 12/24/2016 1021   LYMPHSABS 2.7 12/24/2016 1021   MONOABS 0.9 11/07/2015 1454   EOSABS 0.2 12/24/2016 1021   BASOSABS 0.1 12/24/2016 1021   Iron/TIBC/Ferritin/ %Sat No results found for: IRON, TIBC, FERRITIN, IRONPCTSAT Lipid Panel     Component Value Date/Time   CHOL 204 (H) 04/08/2017 0934   TRIG 88 04/08/2017 0934   HDL 37 (L) 04/08/2017 0934   CHOLHDL 5 03/17/2015 0841   VLDL 22.8 03/17/2015 0841   LDLCALC 149 (H) 04/08/2017 0934   Hepatic Function Panel     Component Value Date/Time   PROT 7.6 04/08/2017  0934   ALBUMIN 4.3 04/08/2017 0934   AST 18 04/08/2017 0934   ALT 25 04/08/2017 0934   ALKPHOS 59 04/08/2017 0934   BILITOT 0.4 04/08/2017 0934   BILIDIR 0.1 11/07/2015 1454      Component Value Date/Time   TSH 1.370 12/24/2016 1021   TSH 1.39 03/17/2015 0841   TSH 1.58 03/02/2014 0911    ASSESSMENT AND PLAN: Prediabetes - Plan: metFORMIN (GLUCOPHAGE) 500 MG tablet  Mixed hyperlipidemia  Vitamin D deficiency - Plan: Vitamin D, Ergocalciferol, (DRISDOL) 50000 units CAPS capsule  At risk for diabetes mellitus  Morbid obesity (HCC) - BMI 45.7   Pre-Diabetes Denzell will continue to work on weight loss, exercise, and decreasing  simple carbohydrates in his diet to help decrease the risk of diabetes. We dicussed metformin including benefits and risks. He was informed that eating too many simple carbohydrates or too many calories at one sitting increases the likelihood of GI side effects. Farley requested metformin for now and a prescription was written today. Rilen agreed to follow up with us as directed to monitor his progress.  Vitamin D Deficiency [name] was informed that low vitamin D levels contributes to fatigue and are associated with obesity, breast, and colon cancer. He agrees to continue to take prescription Vit D @50 ,000 IU every week and will follow up for routine testing of vitamin D, at least 2-3 times per year. He was informed of the risk of over-replacement of vitamin D and agrees to not increase his dose unless he discusses this with us first.  Will recheck labs in 3 months.  Mixed Hyperlipidemia [name] was informed of the American Heart Association Guidelines emphasizing intensive lifestyle modifications as the first line treatment for hyperlipidemia. We discussed many lifestyle modifications today in depth, and [name] will continue to work on decreasing saturated fats such as fatty red meat, butter and many fried foods. He will also increase vegetables and lean protein  in his diet and continue to work on exercise and weight loss efforts. Detravion's ACVD risk is below 7.5, he will continue to work on lifestyle changes but may need to start a statin soon if not improved.  Diabetes risk counselling [name] was given extended (at least 15 minutes) diabetes prevention counseling today. He is 46 y.o. male and has risk factors for diabetes including obesity. We discussed intensive lifestyle modifications today with an emphasis on weight loss as well as increasing exercise and decreasing simple carbohydrates in his diet.  Morbid Obesity [name] is currently in the action stage of change. As such, his goal is to continue with weight loss efforts He has agreed to follow the Category 3 plan [name] has been instructed to work up to a goal of 150 minutes of combined cardio and strengthening exercise per week for weight loss and overall health benefits. We discussed the following Behavioral Modification Stratagies today: increasing lean protein intake and decreasing simple carbohydrates    [name] has agreed to follow up with our clinic in 3 weeks. He was informed of the importance of frequent follow up visits to maximize his success with intensive lifestyle modifications for his multiple health conditions.  I, [place] CraneJoanne Murray, am acting as scribe for Quillian Quincearen Beasley, MD  I have reviewed the above documentation for accuracy and completeness, and I agree with the above. -Quillian Quincearen Beasley, MD

## 2017-04-16 ENCOUNTER — Encounter (INDEPENDENT_AMBULATORY_CARE_PROVIDER_SITE_OTHER): Payer: Self-pay | Admitting: Family Medicine

## 2017-04-22 MED FILL — LEVOCETIRIZINE 5 MG TABLET: 5 | 90 days supply | Qty: 90 | Fill #1 | Status: TO

## 2017-05-06 ENCOUNTER — Ambulatory Visit (INDEPENDENT_AMBULATORY_CARE_PROVIDER_SITE_OTHER): Payer: 59 | Admitting: Family Medicine

## 2017-05-06 VITALS — BP 139/81 | HR 66 | Temp 97.9°F | Ht 67.0 in | Wt 290.0 lb

## 2017-05-06 DIAGNOSIS — I10 Essential (primary) hypertension: Secondary | ICD-10-CM | POA: Diagnosis not present

## 2017-05-06 DIAGNOSIS — E559 Vitamin D deficiency, unspecified: Secondary | ICD-10-CM

## 2017-05-06 MED ORDER — VITAMIN D (ERGOCALCIFEROL) 1.25 MG (50000 UNIT) PO CAPS: 50000.0000 [IU] | ORAL_CAPSULE | ORAL | 0 refills | Status: DC

## 2017-05-06 MED ORDER — CHLORTHALIDONE 25 MG PO TABS: 25.0000 mg | ORAL_TABLET | Freq: Every day | ORAL | 0 refills | Status: DC

## 2017-05-06 MED ORDER — DILTIAZEM HCL ER COATED BEADS 240 MG PO TB24: 240.0000 mg | ORAL_TABLET | Freq: Every day | ORAL | 0 refills | Status: DC

## 2017-05-06 NOTE — Progress Notes (Signed)
Office: [phone]  /  Fax: [phone]   HPI:   Chief Complaint: OBESITY Daniel [name] is here to discuss his progress with his obesity treatment plan. He is on the  follow the Category 3 plan and is following his eating plan approximately 85 % of the time. He states he is exercising cardio 45 minutes 5 times per week. [name] continues to do well with weight loss on category 3 plan. He would like more variety at dinner. His weight is 290 lb (131.5 kg) today and has had a weight loss of 2 pounds over a period of 3 weeks since his last visit. He has lost 19 lbs since starting treatment with [name].  Vitamin D deficiency Daniel [name] has a diagnosis of vitamin D deficiency. He is currently taking vit D and denies nausea, vomiting or muscle weakness.  Essential Hypertension Daniel [name] is a 46 y.o. male with hypertension. His blood pressure is stable and DEFORREST BOGLE denies chest pain or shortness of breath on exertion. He is working weight loss to help control his blood pressure with the goal of decreasing his risk of heart attack and stroke. [name] blood pressure is currently controlled.   ALLERGIES: Allergies  Allergen Reactions  . Bee Venom Anaphylaxis  . Shellfish Allergy Anaphylaxis    MEDICATIONS: Current Outpatient Prescriptions on File Prior to Visit  Medication Sig Dispense Refill  . albuterol (VENTOLIN HFA) 108 (90 Base) MCG/ACT inhaler INHALE 2 PUFFS INTO THE LUNGS EVERY 4 HOURS AS NEEDED FOR WHEEZING OR SHORTNESS OF BREATH. 18 g 5  . EPINEPHrine (EPIPEN 2-PAK) 0.3 mg/0.3 mL SOAJ injection Inject 0.3 mLs (0.3 mg total) into the muscle once. 1 Device 11  . levocetirizine (XYZAL) 5 MG tablet Take 1 tablet (5 mg total) by mouth every evening. 90 tablet 3  . metFORMIN (GLUCOPHAGE) 500 MG tablet Take 1 tablet (500 mg total) by mouth every morning. 30 tablet 0  . montelukast (SINGULAIR) 10 MG tablet Take 1 tablet (10 mg total) by mouth at bedtime. 90 tablet 3  . Multiple Vitamin  (MULTIVITAMIN WITH MINERALS) TABS Take 1 tablet by mouth daily.    [name] omeprazole (PRILOSEC) 20 MG capsule TAKE 1 CAPSULE BY MOUTH ONCE DAILY 90 capsule 3  . Polyvinyl Alcohol-Povidone (CLEAR EYES ALL SEASONS) 5-6 MG/ML SOLN Apply 1 drop to eye once as needed. Reported on 02/01/2016     No current facility-administered medications on file prior to visit.     PAST MEDICAL HISTORY: Past Medical History:  Diagnosis Date  . Asthma   . GERD (gastroesophageal reflux disease)   . Hypertension     PAST SURGICAL HISTORY: No past surgical history on file.  SOCIAL HISTORY: Social History  Substance Use Topics  . Smoking status: Never Smoker  . Smokeless tobacco: Never Used  . Alcohol use No    FAMILY HISTORY: Family History  Problem Relation Age of Onset  . Hyperlipidemia Unknown   . Hypertension Unknown   . Stroke Unknown   . Hypertension Mother   . Hyperlipidemia Mother   . Obesity Mother   . Sudden death Father     ROS: Review of Systems  Constitutional: Positive for weight loss.  Respiratory: Negative for shortness of breath (on exertion).   Cardiovascular: Negative for chest pain.  Gastrointestinal: Negative for nausea and vomiting.  Musculoskeletal:       Negative muscle weakness    PHYSICAL EXAM: Blood pressure 139/81, pulse 66, temperature 97.9 F (36.6 C), temperature source Oral, height  5\' 7"  (1.702 m), weight 290 lb (131.5 kg), SpO2 99 %. Body mass index is 45.42 kg/m. Physical Exam  Constitutional: He is oriented to person, place, and time. He appears well-developed and well-nourished.  Cardiovascular: Normal rate.   Pulmonary/Chest: Effort normal.  Musculoskeletal: Normal range of motion.  Neurological: He is oriented to person, place, and time.  Skin: Skin is warm and dry.  Psychiatric: He has a normal mood and affect. His behavior is normal.  Vitals reviewed.   RECENT LABS AND TESTS: BMET    Component Value Date/Time   NA 143 04/08/2017 0934   K  3.8 04/08/2017 0934   CL 102 04/08/2017 0934   CO2 26 04/08/2017 0934   GLUCOSE 88 04/08/2017 0934   GLUCOSE 103 (H) 11/07/2015 1454   BUN 18 04/08/2017 0934   CREATININE 1.18 04/08/2017 0934   CALCIUM 9.7 04/08/2017 0934   GFRNONAA 75 04/08/2017 0934   GFRAA 87 04/08/2017 0934   Lab Results  Component Value Date   HGBA1C 5.6 04/08/2017   HGBA1C 5.5 12/24/2016   Lab Results  Component Value Date   INSULIN 28.9 (H) 04/08/2017   INSULIN 25.8 (H) 12/24/2016   CBC    Component Value Date/Time   WBC 6.6 12/24/2016 1021   WBC 10.2 11/07/2015 1454   RBC 5.47 12/24/2016 1021   RBC 5.67 11/07/2015 1454   HGB 15.2 12/24/2016 1021   HCT 44.3 12/24/2016 1021   PLT 300.0 11/07/2015 1454   MCV 81 12/24/2016 1021   MCH 27.8 12/24/2016 1021   MCH 28.0 07/18/2012 2030   MCHC 34.3 12/24/2016 1021   MCHC 33.3 11/07/2015 1454   RDW 14.6 12/24/2016 1021   LYMPHSABS 2.7 12/24/2016 1021   MONOABS 0.9 11/07/2015 1454   EOSABS 0.2 12/24/2016 1021   BASOSABS 0.1 12/24/2016 1021   Iron/TIBC/Ferritin/ %Sat No results found for: IRON, TIBC, FERRITIN, IRONPCTSAT Lipid Panel     Component Value Date/Time   CHOL 204 (H) 04/08/2017 0934   TRIG 88 04/08/2017 0934   HDL 37 (L) 04/08/2017 0934   CHOLHDL 5 03/17/2015 0841   VLDL 22.8 03/17/2015 0841   LDLCALC 149 (H) 04/08/2017 0934   Hepatic Function Panel     Component Value Date/Time   PROT 7.6 04/08/2017 0934   ALBUMIN 4.3 04/08/2017 0934   AST 18 04/08/2017 0934   ALT 25 04/08/2017 0934   ALKPHOS 59 04/08/2017 0934   BILITOT 0.4 04/08/2017 0934   BILIDIR 0.1 11/07/2015 1454      Component Value Date/Time   TSH 1.370 12/24/2016 1021   TSH 1.39 03/17/2015 0841   TSH 1.58 03/02/2014 0911    ASSESSMENT AND PLAN: Essential hypertension - Plan: chlorthalidone (HYGROTON) 25 MG tablet, diltiazem (CARDIZEM LA) 240 MG 24 hr tablet  Vitamin D deficiency - Plan: Vitamin D, Ergocalciferol, (DRISDOL) 50000 units CAPS capsule  Morbid  obesity (HCC) - BMI 45.5  PLAN:  Vitamin D Deficiency [name] was informed that low vitamin D levels contributes to fatigue and are associated with obesity, breast, and colon cancer. He agrees to continue to take prescription Vit D @50 ,000 IU every week, we will refill for 1 month and will follow up for routine testing of vitamin D, at least 2-3 times per year. He was informed of the risk of over-replacement of vitamin D and agrees to not increase his dose unless he discusses this with [name] first. Trai agrees to follow up with our clinic in 2 weeks.  Essential Hypertension We discussed  sodium restriction, working on healthy weight loss, and a regular exercise program as the means to achieve improved blood pressure control. [place] agreed with this plan and agreed to follow up as directed. We will continue to monitor his blood pressure as well as his progress with the above lifestyle modifications. He agrees to continue to take chlorthalidone 25 mg once daily #30 with no refills and diltiazem as prescribed, we will refill for 1 month and he will watch for signs of hypotension as he continues his lifestyle modifications. [name] agrees to follow up with our clinic in 2 weeks.  Obesity [name] is currently in the action stage of change. As such, his goal is to continue with weight loss efforts He has agreed to keep a food journal with 400 to 600 calories and 40+ grams of protein at supper daily and follow the Category 3 plan [name] has been instructed to work up to a goal of 150 minutes of combined cardio and strengthening exercise per week for weight loss and overall health benefits. We discussed the following Behavioral Modification Strategies today: increasing lean protein intake and keep a strict food journal  [name] has agreed to follow up with our clinic in 2 weeks. He was informed of the importance of frequent follow up visits to maximize his success with intensive lifestyle modifications for his  multiple health conditions.  I, [place] CraneJoanne Murray, am acting as scribe for Quillian Quincearen Etan Vasudevan, MD  I have reviewed the above documentation for accuracy and completeness, and I agree with the above. -Quillian Quincearen Kippy Gohman, MD  OBESITY BEHAVIORAL INTERVENTION VISIT  Today's visit was # 9 out of 22.  Starting weight: 309 lbs Starting date: 12/24/16 Today's weight : 290 lbs  Today's date: 05/06/2017 Total lbs lost to date: 9319 (Patients must lose 7 lbs in the first 6 months to continue with counseling)   ASK: We discussed the diagnosis of obesity with Marchia Bondracey M Hohler today and [name] agreed to give us permission to discuss obesity behavioral modification therapy today.  ASSESS: [name] has the diagnosis of obesity and his BMI today is 45.5 [name] is in the action stage of change   ADVISE: [name] was educated on the multiple health risks of obesity as well as the benefit of weight loss to improve his health. He was advised of the need for long term treatment and the importance of lifestyle modifications.  AGREE: Multiple dietary modification options and treatment options were discussed and  Pruitt agreed to keep a food journal with 400 to 600 calories and 40+ grams of protein at supper daily and follow the Category 3 plan  We discussed the following Behavioral Modification Strategies today: increasing lean protein intake and keep a strict food journal

## 2017-05-09 MED FILL — CHLORTHALIDONE 25 MG TABLET: 25 | 30 days supply | Qty: 30 | Fill #0

## 2017-05-09 MED FILL — MATZIM LA 240 MG TB24: 240 | 30 days supply | Qty: 30 | Fill #0

## 2017-05-09 MED FILL — VIT D2 1.25 MG (50,000 UNIT: 1.25 MG | 28 days supply | Qty: 4 | Fill #0

## 2017-05-13 MED FILL — MONTELUKAST SOD 10 MG TAB: 10 | 90 days supply | Qty: 90 | Fill #1

## 2017-05-13 MED FILL — OMEPRAZOLE DR 20 MG CAPSULE: 20 | 90 days supply | Qty: 90 | Fill #1

## 2017-05-15 ENCOUNTER — Encounter (INDEPENDENT_AMBULATORY_CARE_PROVIDER_SITE_OTHER): Payer: Self-pay | Admitting: Family Medicine

## 2017-05-26 ENCOUNTER — Ambulatory Visit (INDEPENDENT_AMBULATORY_CARE_PROVIDER_SITE_OTHER): Payer: 59 | Admitting: Family Medicine

## 2017-05-26 VITALS — BP 118/74 | HR 60 | Temp 98.3°F | Ht 67.0 in | Wt 289.0 lb

## 2017-05-26 DIAGNOSIS — Z6841 Body Mass Index (BMI) 40.0 and over, adult: Secondary | ICD-10-CM | POA: Diagnosis not present

## 2017-05-26 DIAGNOSIS — E669 Obesity, unspecified: Secondary | ICD-10-CM

## 2017-05-26 DIAGNOSIS — IMO0001 Reserved for inherently not codable concepts without codable children: Secondary | ICD-10-CM

## 2017-05-26 DIAGNOSIS — I10 Essential (primary) hypertension: Secondary | ICD-10-CM | POA: Diagnosis not present

## 2017-05-26 DIAGNOSIS — E559 Vitamin D deficiency, unspecified: Secondary | ICD-10-CM | POA: Diagnosis not present

## 2017-05-26 MED ORDER — VITAMIN D (ERGOCALCIFEROL) 1.25 MG (50000 UNIT) PO CAPS: 50000.0000 [IU] | ORAL_CAPSULE | ORAL | 0 refills | Status: DC

## 2017-05-26 MED ORDER — CHLORTHALIDONE 25 MG PO TABS: 25.0000 mg | ORAL_TABLET | Freq: Every day | ORAL | 0 refills | Status: DC

## 2017-05-26 MED ORDER — DILTIAZEM HCL ER COATED BEADS 240 MG PO TB24: 240.0000 mg | ORAL_TABLET | Freq: Every day | ORAL | 0 refills | Status: DC

## 2017-05-26 NOTE — Progress Notes (Signed)
Office: [phone]  /  Fax: [phone]   HPI:   Chief Complaint: OBESITY Daniel [name] is here to discuss his progress with his obesity treatment plan. He is on the  follow the Category 3 plan and is following his eating plan approximately 90 % of the time. He states he is exercising cardio 45 minutes 6 times per week. Daniel [name] continues to do well with weight loss. He is doing portion IT sales professional. His weight is 289 lb (131.1 kg) today and has had a weight loss of 1 pound over a period of 2 to 3 weeks since his last visit. He has lost 20 lbs since starting treatment with [name].  Vitamin D deficiency Daniel [name] has a diagnosis of vitamin D deficiency. He is currently taking vit D and denies nausea, vomiting or muscle weakness.  Hypertension Daniel [name] is a 46 y.o. male with hypertension. His blood pressure is stable. Daniel [name] denies chest pain or shortness of breath on exertion. He is working weight loss to help control his blood pressure with the goal of decreasing his risk of heart attack and stroke. Daniel [name] blood pressure is currently controlled.    ALLERGIES: Allergies  Allergen Reactions  . Bee Venom Anaphylaxis  . Shellfish Allergy Anaphylaxis    MEDICATIONS: Current Outpatient Prescriptions on File Prior to Visit  Medication Sig Dispense Refill  . albuterol (VENTOLIN HFA) 108 (90 Base) MCG/ACT inhaler INHALE 2 PUFFS INTO THE LUNGS EVERY 4 HOURS AS NEEDED FOR WHEEZING OR SHORTNESS OF BREATH. 18 g 5  . chlorthalidone (HYGROTON) 25 MG tablet Take 1 tablet (25 mg total) by mouth daily. 30 tablet 0  . diltiazem (CARDIZEM LA) 240 MG 24 hr tablet Take 1 tablet (240 mg total) by mouth daily. 30 tablet 0  . EPINEPHrine (EPIPEN 2-PAK) 0.3 mg/0.3 mL SOAJ injection Inject 0.3 mLs (0.3 mg total) into the muscle once. 1 Device 11  . levocetirizine (XYZAL) 5 MG tablet Take 1 tablet (5 mg total) by mouth every evening. 90 tablet 3  . metFORMIN (GLUCOPHAGE) 500 MG tablet Take 1  tablet (500 mg total) by mouth every morning. 30 tablet 0  . montelukast (SINGULAIR) 10 MG tablet Take 1 tablet (10 mg total) by mouth at bedtime. 90 tablet 3  . Multiple Vitamin (MULTIVITAMIN WITH MINERALS) TABS Take 1 tablet by mouth daily.    [name] omeprazole (PRILOSEC) 20 MG capsule TAKE 1 CAPSULE BY MOUTH ONCE DAILY 90 capsule 3  . Polyvinyl Alcohol-Povidone (CLEAR EYES ALL SEASONS) 5-6 MG/ML SOLN Apply 1 drop to eye once as needed. Reported on 02/01/2016    . Vitamin D, Ergocalciferol, (DRISDOL) 50000 units CAPS capsule Take 1 capsule (50,000 Units total) by mouth every 7 (seven) days. 4 capsule 0   No current facility-administered medications on file prior to visit.     PAST MEDICAL HISTORY: Past Medical History:  Diagnosis Date  . Asthma   . GERD (gastroesophageal reflux disease)   . Hypertension     PAST SURGICAL HISTORY: No past surgical history on file.  SOCIAL HISTORY: Social History  Substance Use Topics  . Smoking status: Never Smoker  . Smokeless tobacco: Never Used  . Alcohol use No    FAMILY HISTORY: Family History  Problem Relation Age of Onset  . Hyperlipidemia Unknown   . Hypertension Unknown   . Stroke Unknown   . Hypertension Mother   . Hyperlipidemia Mother   . Obesity Mother   . Sudden death Father     ROS:  Review of Systems  Constitutional: Positive for weight loss.  Respiratory: Negative for shortness of breath (on exertion).   Cardiovascular: Negative for chest pain.  Gastrointestinal: Negative for nausea and vomiting.  Musculoskeletal:       Negative muscle weakness    PHYSICAL EXAM: Blood pressure 118/74, pulse 60, temperature 98.3 F (36.8 C), temperature source Oral, height 5\' 7"  (1.702 m), weight 289 lb (131.1 kg), SpO2 98 %. Body mass index is 45.26 kg/m. Physical Exam  Constitutional: He is oriented to person, place, and time. He appears well-developed and well-nourished.  Cardiovascular: Normal rate.   Pulmonary/Chest: Effort  normal.  Musculoskeletal: Normal range of motion.  Neurological: He is oriented to person, place, and time.  Skin: Skin is dry.  Psychiatric: He has a normal mood and affect. His behavior is normal.  Vitals reviewed.   RECENT LABS AND TESTS: BMET    Component Value Date/Time   NA 143 04/08/2017 0934   K 3.8 04/08/2017 0934   CL 102 04/08/2017 0934   CO2 26 04/08/2017 0934   GLUCOSE 88 04/08/2017 0934   GLUCOSE 103 (H) 11/07/2015 1454   BUN 18 04/08/2017 0934   CREATININE 1.18 04/08/2017 0934   CALCIUM 9.7 04/08/2017 0934   GFRNONAA 75 04/08/2017 0934   GFRAA 87 04/08/2017 0934   Lab Results  Component Value Date   HGBA1C 5.6 04/08/2017   HGBA1C 5.5 12/24/2016   Lab Results  Component Value Date   INSULIN 28.9 (H) 04/08/2017   INSULIN 25.8 (H) 12/24/2016   CBC    Component Value Date/Time   WBC 6.6 12/24/2016 1021   WBC 10.2 11/07/2015 1454   RBC 5.47 12/24/2016 1021   RBC 5.67 11/07/2015 1454   HGB 15.2 12/24/2016 1021   HCT 44.3 12/24/2016 1021   PLT 300.0 11/07/2015 1454   MCV 81 12/24/2016 1021   MCH 27.8 12/24/2016 1021   MCH 28.0 07/18/2012 2030   MCHC 34.3 12/24/2016 1021   MCHC 33.3 11/07/2015 1454   RDW 14.6 12/24/2016 1021   LYMPHSABS 2.7 12/24/2016 1021   MONOABS 0.9 11/07/2015 1454   EOSABS 0.2 12/24/2016 1021   BASOSABS 0.1 12/24/2016 1021   Iron/TIBC/Ferritin/ %Sat No results found for: IRON, TIBC, FERRITIN, IRONPCTSAT Lipid Panel     Component Value Date/Time   CHOL 204 (H) 04/08/2017 0934   TRIG 88 04/08/2017 0934   HDL 37 (L) 04/08/2017 0934   CHOLHDL 5 03/17/2015 0841   VLDL 22.8 03/17/2015 0841   LDLCALC 149 (H) 04/08/2017 0934   Hepatic Function Panel     Component Value Date/Time   PROT 7.6 04/08/2017 0934   ALBUMIN 4.3 04/08/2017 0934   AST 18 04/08/2017 0934   ALT 25 04/08/2017 0934   ALKPHOS 59 04/08/2017 0934   BILITOT 0.4 04/08/2017 0934   BILIDIR 0.1 11/07/2015 1454      Component Value Date/Time   TSH 1.370  12/24/2016 1021   TSH 1.39 03/17/2015 0841   TSH 1.58 03/02/2014 0911    ASSESSMENT AND PLAN: Essential hypertension - Plan: diltiazem (CARDIZEM LA) 240 MG 24 hr tablet, chlorthalidone (HYGROTON) 25 MG tablet  Vitamin D deficiency - Plan: Vitamin D, Ergocalciferol, (DRISDOL) 50000 units CAPS capsule  Class 3 obesity with serious comorbidity and body mass index (BMI) of 45.0 to 49.9 in adult, unspecified obesity type (HCC)  PLAN:  Vitamin D Deficiency [name] was informed that low vitamin D levels contributes to fatigue and are associated with obesity, breast, and colon cancer. He  agrees to continue to take prescription Vit D @50 ,000 IU every week, we will refill for 1 month and will follow up for routine testing of vitamin D, at least 2-3 times per year. He was informed of the risk of over-replacement of vitamin D and agrees to not increase his dose unless he discusses this with us first. Daniel [name] agrees to follow up with our clinic in 3 weeks.  Hypertension We discussed sodium restriction, working on healthy weight loss, and a regular exercise program as the means to achieve improved blood pressure control. Saben agreed with this plan and agreed to follow up as directed. We will continue to monitor his blood pressure as well as his progress with the above lifestyle modifications. He agrees to continue Cardizem 240 mg qd #30 with no refills and Chlorthalidone 25 mg qd #30 with no refills and will watch for signs of hypotension as he continues his lifestyle modifications.  Obesity Daniel [name] is currently in the action stage of change. As such, his goal is to continue with weight loss efforts He has agreed to keep a food journal with 400 to 600 calories and 40 grams of protein at lunch daily and follow the Category 3 plan Daniel [name] has been instructed to work up to a goal of 150 minutes of combined cardio and strengthening exercise per week for weight loss and overall health benefits. We discussed the  following Behavioral Modification Strategies today: increasing lean protein intake and meal planning & cooking strategies  Daniel [name] has agreed to follow up with our clinic in 3 weeks. He was informed of the importance of frequent follow up visits to maximize his success with intensive lifestyle modifications for his multiple health conditions.  I, [place] CraneJoanne Murray, am acting as transcriptionist for Quillian Quincearen Shellie Rogoff, MD  I have reviewed the above documentation for accuracy and completeness, and I agree with the above. -Quillian Quincearen Leland Raver, MD  OBESITY BEHAVIORAL INTERVENTION VISIT  Today's visit was # 10 out of 22.  Starting weight: 309 lbs Starting date: 12/24/16 Today's weight : 289 lbs Today's date: 05/26/2017 Total lbs lost to date: 20 (Patients must lose 7 lbs in the first 6 months to continue with counseling)   ASK: We discussed the diagnosis of obesity with Marchia Bondracey M Tokunaga today and Daniel [name] agreed to give us permission to discuss obesity behavioral modification therapy today.  ASSESS: Daniel [name] has the diagnosis of obesity and his BMI today is 45.4 Daniel [name] is in the action stage of change   ADVISE: Daniel [name] was educated on the multiple health risks of obesity as well as the benefit of weight loss to improve his health. He was advised of the need for long term treatment and the importance of lifestyle modifications.  AGREE: Multiple dietary modification options and treatment options were discussed and  Jimi agreed to keep a food journal with 400 to 600 calories and 40 grams of protein at lunch daily and follow the Category 3 plan We discussed the following Behavioral Modification Strategies today: increasing lean protein intake and meal planning & cooking strategies

## 2017-06-11 MED FILL — DILTIAZEM ER 240 MG TABLET: 240 | 30 days supply | Qty: 30 | Fill #0

## 2017-06-11 MED FILL — VIT D2 1.25 MG (50,000 UNIT: 1.25 MG | 28 days supply | Qty: 4 | Fill #0

## 2017-06-11 MED FILL — CHLORTHALIDONE 25 MG TABLET: 25 | 30 days supply | Qty: 30 | Fill #0

## 2017-06-16 ENCOUNTER — Ambulatory Visit (INDEPENDENT_AMBULATORY_CARE_PROVIDER_SITE_OTHER): Payer: 59 | Admitting: Family Medicine

## 2017-06-16 VITALS — BP 115/69 | HR 83 | Temp 98.6°F | Ht 68.0 in | Wt 286.0 lb

## 2017-06-16 DIAGNOSIS — Z6841 Body Mass Index (BMI) 40.0 and over, adult: Secondary | ICD-10-CM

## 2017-06-16 DIAGNOSIS — K219 Gastro-esophageal reflux disease without esophagitis: Secondary | ICD-10-CM | POA: Diagnosis not present

## 2017-06-16 DIAGNOSIS — E669 Obesity, unspecified: Secondary | ICD-10-CM | POA: Diagnosis not present

## 2017-06-16 DIAGNOSIS — I10 Essential (primary) hypertension: Secondary | ICD-10-CM

## 2017-06-16 DIAGNOSIS — Z9189 Other specified personal risk factors, not elsewhere classified: Secondary | ICD-10-CM

## 2017-06-16 DIAGNOSIS — IMO0001 Reserved for inherently not codable concepts without codable children: Secondary | ICD-10-CM

## 2017-06-17 NOTE — Progress Notes (Signed)
Office: [phone]  /  Fax: [phone]   HPI:   Chief Complaint: OBESITY Ferdie is here to discuss his progress with his obesity treatment plan. He is on the  keep a food journal with 400 to 600 calories and 40 grams of protein at lunch daily and follow the Category 3 plan and is following his eating plan approximately 80 % of the time. He states he is exercising weights and cardio training 45 minutes 6 times per week. Isais has done very well with weight loss and is concentrating on increasing lean protein. He noted eating fast food and having significant GI upset and diarrhea. His weight is 286 lb (129.7 kg) today and has had a weight loss of 3 pounds over a period of 3 weeks since his last visit. He has lost 23 lbs since starting treatment with [name].  Hypertension RAIMUNDO CORBIT is a 46 y.o. male with hypertension. His blood pressure continues to decrease and he denies lightheadedness but is feeling more tired. He has increased H2O intake to > 100 ounces per day. Kamal Jurgens Grimes denies chest pain or shortness of breath on exertion. He is working weight loss to help control his blood pressure with the goal of decreasing his risk of heart attack and stroke. [name] blood pressure is currently controlled.  Gastroesophageal Reflux Disease Seanmichael notes decrease in symptoms with weight loss over the last 2 months. He is still on prilosec.  At risk for cardiovascular disease Haig is at a higher than average risk for cardiovascular disease due to obesity and hypertension. He currently denies any chest pain.   ALLERGIES: Allergies  Allergen Reactions   Bee Venom Anaphylaxis   Shellfish Allergy Anaphylaxis    MEDICATIONS: Current Outpatient Prescriptions on File Prior to Visit  Medication Sig Dispense Refill   albuterol (VENTOLIN HFA) 108 (90 Base) MCG/ACT inhaler INHALE 2 PUFFS INTO THE LUNGS EVERY 4 HOURS AS NEEDED FOR WHEEZING OR SHORTNESS OF BREATH. 18 g 5   diltiazem  (CARDIZEM LA) 240 MG 24 hr tablet Take 1 tablet (240 mg total) by mouth daily. 30 tablet 0   EPINEPHrine (EPIPEN 2-PAK) 0.3 mg/0.3 mL SOAJ injection Inject 0.3 mLs (0.3 mg total) into the muscle once. 1 Device 11   levocetirizine (XYZAL) 5 MG tablet Take 1 tablet (5 mg total) by mouth every evening. 90 tablet 3   metFORMIN (GLUCOPHAGE) 500 MG tablet Take 1 tablet (500 mg total) by mouth every morning. 30 tablet 0   montelukast (SINGULAIR) 10 MG tablet Take 1 tablet (10 mg total) by mouth at bedtime. 90 tablet 3   Multiple Vitamin (MULTIVITAMIN WITH MINERALS) TABS Take 1 tablet by mouth daily.     omeprazole (PRILOSEC) 20 MG capsule TAKE 1 CAPSULE BY MOUTH ONCE DAILY 90 capsule 3   Polyvinyl Alcohol-Povidone (CLEAR EYES ALL SEASONS) 5-6 MG/ML SOLN Apply 1 drop to eye once as needed. Reported on 02/01/2016     Vitamin D, Ergocalciferol, (DRISDOL) 50000 units CAPS capsule Take 1 capsule (50,000 Units total) by mouth every 7 (seven) days. 4 capsule 0   No current facility-administered medications on file prior to visit.     PAST MEDICAL HISTORY: Past Medical History:  Diagnosis Date   Asthma    GERD (gastroesophageal reflux disease)    Hypertension     PAST SURGICAL HISTORY: No past surgical history on file.  SOCIAL HISTORY: Social History  Substance Use Topics   Smoking status: Never Smoker   Smokeless tobacco: Never Used  Alcohol use No    FAMILY HISTORY: Family History  Problem Relation Age of Onset   Hyperlipidemia Unknown    Hypertension Unknown    Stroke Unknown    Hypertension Mother    Hyperlipidemia Mother    Obesity Mother    Sudden death Father     ROS: Review of Systems  Constitutional: Positive for malaise/fatigue and weight loss.  Respiratory: Negative for shortness of breath (on exertion).   Cardiovascular: Negative for chest pain.  Neurological:       Negative lightheadedness    PHYSICAL EXAM: Blood pressure 115/69, pulse 83,  temperature 98.6 F (37 C), temperature source Oral, height 5\' 8"  (1.727 m), weight 286 lb (129.7 kg), SpO2 95 %. Body mass index is 43.49 kg/m. Physical Exam  Constitutional: He is oriented to person, place, and time. He appears well-developed and well-nourished.  Cardiovascular: Normal rate.   Pulmonary/Chest: Effort normal.  Musculoskeletal: Normal range of motion.  Neurological: He is oriented to person, place, and time.  Skin: Skin is warm and dry.  Psychiatric: He has a normal mood and affect. His behavior is normal.  Vitals reviewed.   RECENT LABS AND TESTS: BMET    Component Value Date/Time   NA 143 04/08/2017 0934   K 3.8 04/08/2017 0934   CL 102 04/08/2017 0934   CO2 26 04/08/2017 0934   GLUCOSE 88 04/08/2017 0934   GLUCOSE 103 (H) 11/07/2015 1454   BUN 18 04/08/2017 0934   CREATININE 1.18 04/08/2017 0934   CALCIUM 9.7 04/08/2017 0934   GFRNONAA 75 04/08/2017 0934   GFRAA 87 04/08/2017 0934   Lab Results  Component Value Date   HGBA1C 5.6 04/08/2017   HGBA1C 5.5 12/24/2016   Lab Results  Component Value Date   INSULIN 28.9 (H) 04/08/2017   INSULIN 25.8 (H) 12/24/2016   CBC    Component Value Date/Time   WBC 6.6 12/24/2016 1021   WBC 10.2 11/07/2015 1454   RBC 5.47 12/24/2016 1021   RBC 5.67 11/07/2015 1454   HGB 15.2 12/24/2016 1021   HCT 44.3 12/24/2016 1021   PLT 300.0 11/07/2015 1454   MCV 81 12/24/2016 1021   MCH 27.8 12/24/2016 1021   MCH 28.0 07/18/2012 2030   MCHC 34.3 12/24/2016 1021   MCHC 33.3 11/07/2015 1454   RDW 14.6 12/24/2016 1021   LYMPHSABS 2.7 12/24/2016 1021   MONOABS 0.9 11/07/2015 1454   EOSABS 0.2 12/24/2016 1021   BASOSABS 0.1 12/24/2016 1021   Iron/TIBC/Ferritin/ %Sat No results found for: IRON, TIBC, FERRITIN, IRONPCTSAT Lipid Panel     Component Value Date/Time   CHOL 204 (H) 04/08/2017 0934   TRIG 88 04/08/2017 0934   HDL 37 (L) 04/08/2017 0934   CHOLHDL 5 03/17/2015 0841   VLDL 22.8 03/17/2015 0841   LDLCALC  149 (H) 04/08/2017 0934   Hepatic Function Panel     Component Value Date/Time   PROT 7.6 04/08/2017 0934   ALBUMIN 4.3 04/08/2017 0934   AST 18 04/08/2017 0934   ALT 25 04/08/2017 0934   ALKPHOS 59 04/08/2017 0934   BILITOT 0.4 04/08/2017 0934   BILIDIR 0.1 11/07/2015 1454      Component Value Date/Time   TSH 1.370 12/24/2016 1021   TSH 1.39 03/17/2015 0841   TSH 1.58 03/02/2014 0911    ASSESSMENT AND PLAN: Essential hypertension  Gastroesophageal reflux disease, esophagitis presence not specified  At risk for heart disease  Class 3 obesity with serious comorbidity and body mass index (BMI)  of 40.0 to 44.9 in adult, unspecified obesity type (HCC)  PLAN:  Hypertension We discussed sodium restriction, working on healthy weight loss, and a regular exercise program as the means to achieve improved blood pressure control. Gennie agreed with this plan and agreed to follow up as directed. We will continue to monitor his blood pressure as well as his progress with the above lifestyle modifications. He agrees to continue cardizem and discontinue chlorthalidone and we will re-check blood pressure in 2 weeks and Sava will watch for signs of hypotension as he continues his lifestyle modifications.  Gastroesophageal Reflux Disease Donivin agrees to continue prilosec for now and we will start to decrease dose to qod after another 10 lb loss.  Cardiovascular risk counselling Ziyan was given extended (15 minutes) coronary artery disease prevention counseling today. He is 46 y.o. male and has risk factors for heart disease including obesity and hypertension. We discussed intensive lifestyle modifications today with an emphasis on specific weight loss instructions and strategies. Pt was also informed of the importance of increasing exercise and decreasing saturated fats to help prevent heart disease.  Obesity Rehman is currently in the action stage of change. As such, his goal is to  continue with weight loss efforts He has agreed to change to keep a food journal with 1500 to 1800 calories and 80+ grams of protein daily Jakylan has been instructed to work up to a goal of 150 minutes of combined cardio and strengthening exercise per week for weight loss and overall health benefits. We discussed the following Behavioral Modification Strategies today: increasing lean protein intake, increasing vegetables and decrease eating out  Delance has agreed to follow up with our clinic in 2 weeks. He was informed of the importance of frequent follow up visits to maximize his success with intensive lifestyle modifications for his multiple health conditions.  I, [place] Crane, am acting as transcriptionist for Quillian Quince, MD  I have reviewed the above documentation for accuracy and completeness, and I agree with the above. -Quillian Quince, MD   OBESITY BEHAVIORAL INTERVENTION VISIT  Today's visit was # 10 out of 22.  Starting weight: 309 lbs Starting date: 12/24/16 Today's weight : 286 lbs Today's date: 06/16/2017 Total lbs lost to date: 23 (Patients must lose 7 lbs in the first 6 months to continue with counseling)   ASK: We discussed the diagnosis of obesity with [name] today and Montavis agreed to give [name] permission to discuss obesity behavioral modification therapy today.  ASSESS: Lafe has the diagnosis of obesity and his BMI today is 43.6 Jamari is in the action stage of change   ADVISE: Rayburn was educated on the multiple health risks of obesity as well as the benefit of weight loss to improve his health. He was advised of the need for long term treatment and the importance of lifestyle modifications.  AGREE: Multiple dietary modification options and treatment options were discussed and  Hamid agreed to change to keep a food journal with 1500 to 1800 calories and 80+ grams of protein  We discussed the following Behavioral Modification Strategies today:  increasing lean protein intake, increasing vegetables and decrease eating out

## 2017-06-30 ENCOUNTER — Ambulatory Visit (INDEPENDENT_AMBULATORY_CARE_PROVIDER_SITE_OTHER): Payer: 59 | Admitting: Family Medicine

## 2017-06-30 VITALS — BP 128/80 | HR 70 | Temp 98.1°F | Ht 68.0 in | Wt 291.0 lb

## 2017-06-30 DIAGNOSIS — Z6841 Body Mass Index (BMI) 40.0 and over, adult: Secondary | ICD-10-CM | POA: Diagnosis not present

## 2017-06-30 DIAGNOSIS — I10 Essential (primary) hypertension: Secondary | ICD-10-CM | POA: Diagnosis not present

## 2017-06-30 DIAGNOSIS — Z9189 Other specified personal risk factors, not elsewhere classified: Secondary | ICD-10-CM | POA: Diagnosis not present

## 2017-06-30 DIAGNOSIS — E559 Vitamin D deficiency, unspecified: Secondary | ICD-10-CM

## 2017-06-30 DIAGNOSIS — IMO0001 Reserved for inherently not codable concepts without codable children: Secondary | ICD-10-CM | POA: Insufficient documentation

## 2017-06-30 DIAGNOSIS — K219 Gastro-esophageal reflux disease without esophagitis: Secondary | ICD-10-CM | POA: Diagnosis not present

## 2017-06-30 DIAGNOSIS — E669 Obesity, unspecified: Secondary | ICD-10-CM

## 2017-06-30 MED ORDER — DILTIAZEM HCL ER COATED BEADS 240 MG PO TB24: 240.0000 mg | ORAL_TABLET | Freq: Every day | ORAL | 0 refills | Status: DC

## 2017-06-30 MED ORDER — VITAMIN D (ERGOCALCIFEROL) 1.25 MG (50000 UNIT) PO CAPS: 50000.0000 [IU] | ORAL_CAPSULE | ORAL | 0 refills | Status: DC

## 2017-06-30 NOTE — Progress Notes (Signed)
Office: [phone]  /  Fax: [phone]   HPI:   Chief Complaint: OBESITY Hale is here to discuss his progress with his obesity treatment plan. He is on the  keep a food journal with 1500 to 1800 calories and 80+ grams of protein  and is following his eating plan approximately 75 % of the time. He states he is exercising cardio and weights for 45 minutes 5 times per week. Drayk has increased H2O intake and discontinued his HCTZ. He is retaining some fluid due to this. Pranish is doing well on his diet prescription and exercise overall. His weight is 291 lb (132 kg) today and has had a weight gain of 5 pounds over a period of 2 weeks since his last visit. He has lost 18 lbs since starting treatment with [name].  Vitamin D deficiency Nevin has a diagnosis of vitamin D deficiency. He is currently stable on vit D and denies nausea, vomiting or muscle weakness.  Gastroesophageal Reflux Disease Fermon discontinued his acid reflux medications and still notes no further symptoms since he has lost weight.  Hypertension WILTON THRALL is a 47 y.o. male with hypertension. He discontinued his HCTZ due to blood pressure decrease with weight loss. His blood pressure is up slightly in H2O weight but his blood pressure is still well controlled.  Clance Baquero Palladino denies chest pain or shortness of breath on exertion. He is working weight loss to help control his blood pressure with the goal of decreasing his risk of heart attack and stroke. [name] blood pressure is currently controlled.  At risk for cardiovascular disease Kameren is at a higher than average risk for cardiovascular disease due to obesity and hypertension. He currently denies any chest pain.   ALLERGIES: Allergies  Allergen Reactions  . Bee Venom Anaphylaxis  . Shellfish Allergy Anaphylaxis    MEDICATIONS: Current Outpatient Prescriptions on File Prior to Visit  Medication Sig Dispense Refill  . albuterol (VENTOLIN HFA) 108 (90  Base) MCG/ACT inhaler INHALE 2 PUFFS INTO THE LUNGS EVERY 4 HOURS AS NEEDED FOR WHEEZING OR SHORTNESS OF BREATH. 18 g 5  . EPINEPHrine (EPIPEN 2-PAK) 0.3 mg/0.3 mL SOAJ injection Inject 0.3 mLs (0.3 mg total) into the muscle once. 1 Device 11  . levocetirizine (XYZAL) 5 MG tablet Take 1 tablet (5 mg total) by mouth every evening. 90 tablet 3  . metFORMIN (GLUCOPHAGE) 500 MG tablet Take 1 tablet (500 mg total) by mouth every morning. 30 tablet 0  . montelukast (SINGULAIR) 10 MG tablet Take 1 tablet (10 mg total) by mouth at bedtime. 90 tablet 3  . Multiple Vitamin (MULTIVITAMIN WITH MINERALS) TABS Take 1 tablet by mouth daily.    [name] omeprazole (PRILOSEC) 20 MG capsule TAKE 1 CAPSULE BY MOUTH ONCE DAILY 90 capsule 3  . Polyvinyl Alcohol-Povidone (CLEAR EYES ALL SEASONS) 5-6 MG/ML SOLN Apply 1 drop to eye once as needed. Reported on 02/01/2016     No current facility-administered medications on file prior to visit.     PAST MEDICAL HISTORY: Past Medical History:  Diagnosis Date  . Asthma   . GERD (gastroesophageal reflux disease)   . Hypertension     PAST SURGICAL HISTORY: No past surgical history on file.  SOCIAL HISTORY: Social History  Substance Use Topics  . Smoking status: Never Smoker  . Smokeless tobacco: Never Used  . Alcohol use No    FAMILY HISTORY: Family History  Problem Relation Age of Onset  . Hyperlipidemia Unknown   .  Hypertension Unknown   . Stroke Unknown   . Hypertension Mother   . Hyperlipidemia Mother   . Obesity Mother   . Sudden death Father     ROS: Review of Systems  Constitutional: Negative for weight loss.  Respiratory: Negative for shortness of breath (on exertion).   Cardiovascular: Negative for chest pain.  Gastrointestinal: Negative for nausea and vomiting.  Musculoskeletal:       Negative muscle weakness    PHYSICAL EXAM: Blood pressure 128/80, pulse 70, temperature 98.1 F (36.7 C), temperature source Oral, height 5\' 8"  (1.727 m),  weight 291 lb (132 kg), SpO2 97 %. Body mass index is 44.25 kg/m. Physical Exam  Constitutional: He is oriented to person, place, and time. He appears well-developed and well-nourished.  Cardiovascular: Normal rate.   Pulmonary/Chest: Effort normal.  Musculoskeletal: Normal range of motion.  Neurological: He is oriented to person, place, and time.  Skin: Skin is warm and dry.  Psychiatric: He has a normal mood and affect. His behavior is normal.  Vitals reviewed.   RECENT LABS AND TESTS: BMET    Component Value Date/Time   NA 143 04/08/2017 0934   K 3.8 04/08/2017 0934   CL 102 04/08/2017 0934   CO2 26 04/08/2017 0934   GLUCOSE 88 04/08/2017 0934   GLUCOSE 103 (H) 11/07/2015 1454   BUN 18 04/08/2017 0934   CREATININE 1.18 04/08/2017 0934   CALCIUM 9.7 04/08/2017 0934   GFRNONAA 75 04/08/2017 0934   GFRAA 87 04/08/2017 0934   Lab Results  Component Value Date   HGBA1C 5.6 04/08/2017   HGBA1C 5.5 12/24/2016   Lab Results  Component Value Date   INSULIN 28.9 (H) 04/08/2017   INSULIN 25.8 (H) 12/24/2016   CBC    Component Value Date/Time   WBC 6.6 12/24/2016 1021   WBC 10.2 11/07/2015 1454   RBC 5.47 12/24/2016 1021   RBC 5.67 11/07/2015 1454   HGB 15.2 12/24/2016 1021   HCT 44.3 12/24/2016 1021   PLT 300.0 11/07/2015 1454   MCV 81 12/24/2016 1021   MCH 27.8 12/24/2016 1021   MCH 28.0 07/18/2012 2030   MCHC 34.3 12/24/2016 1021   MCHC 33.3 11/07/2015 1454   RDW 14.6 12/24/2016 1021   LYMPHSABS 2.7 12/24/2016 1021   MONOABS 0.9 11/07/2015 1454   EOSABS 0.2 12/24/2016 1021   BASOSABS 0.1 12/24/2016 1021   Iron/TIBC/Ferritin/ %Sat No results found for: IRON, TIBC, FERRITIN, IRONPCTSAT Lipid Panel     Component Value Date/Time   CHOL 204 (H) 04/08/2017 0934   TRIG 88 04/08/2017 0934   HDL 37 (L) 04/08/2017 0934   CHOLHDL 5 03/17/2015 0841   VLDL 22.8 03/17/2015 0841   LDLCALC 149 (H) 04/08/2017 0934   Hepatic Function Panel     Component Value  Date/Time   PROT 7.6 04/08/2017 0934   ALBUMIN 4.3 04/08/2017 0934   AST 18 04/08/2017 0934   ALT 25 04/08/2017 0934   ALKPHOS 59 04/08/2017 0934   BILITOT 0.4 04/08/2017 0934   BILIDIR 0.1 11/07/2015 1454      Component Value Date/Time   TSH 1.370 12/24/2016 1021   TSH 1.39 03/17/2015 0841   TSH 1.58 03/02/2014 0911    ASSESSMENT AND PLAN: Gastroesophageal reflux disease without esophagitis  Essential hypertension - Plan: diltiazem (CARDIZEM LA) 240 MG 24 hr tablet  Vitamin D deficiency - Plan: Vitamin D, Ergocalciferol, (DRISDOL) 50000 units CAPS capsule  At risk for heart disease  Class 3 obesity with serious comorbidity  and body mass index (BMI) of 40.0 to 44.9 in adult, unspecified obesity type (HCC)  PLAN:  Vitamin D Deficiency Cullen was informed that low vitamin D levels contributes to fatigue and are associated with obesity, breast, and colon cancer. He agrees to continue to take prescription Vit D @50 ,000 IU every week, we will refill for 1 month and will follow up for routine testing of vitamin D, at least 2-3 times per year. He was informed of the risk of over-replacement of vitamin D and agrees to not increase his dose unless he discusses this with [name] first. Nussen agrees to follow up with our clinic in 2 to 3 weeks.  Gastroesophageal Reflux Disease Jasdeep agrees to continue with diet, exercise and weight loss to help control gastroesophageal reflux disease and will follow up with our clinic in 2 to 3 weeks.  Hypertension We discussed sodium restriction, working on healthy weight loss, and a regular exercise program as the means to achieve improved blood pressure control. Demarcus agreed with this plan and agreed to follow up as directed. We will continue to monitor his blood pressure as well as his progress with the above lifestyle modifications. He will continue diltiazem as prescribed, we will refill for 1 month and he will watch for signs of hypotension as he  continues his lifestyle modifications.  Cardiovascular risk counselling Javid was given extended (15 minutes) coronary artery disease prevention counseling today. He is 46 y.o. male and has risk factors for heart disease including obesity and hypertension. We discussed intensive lifestyle modifications today with an emphasis on specific weight loss instructions and strategies. Pt was also informed of the importance of increasing exercise and decreasing saturated fats to help prevent heart disease.  Obesity Willys is currently in the action stage of change. As such, his goal is to continue with weight loss efforts He has agreed to keep a food journal with 1500 to 1800 calories and 85+ grams of protein daiily Akeen has been instructed to work up to a goal of 150 minutes of combined cardio and strengthening exercise per week for weight loss and overall health benefits. We discussed the following Behavioral Modification Strategies today: increase H2O intake, no skipping meals, increasing lean protein intake and decreasing simple carbohydrates   Keondre has agreed to follow up with our clinic in 2 to 3 weeks. He was informed of the importance of frequent follow up visits to maximize his success with intensive lifestyle modifications for his multiple health conditions.  I, [place] Crane, am acting as transcriptionist for Quillian Quince, MD  I have reviewed the above documentation for accuracy and completeness, and I agree with the above. -Quillian Quince, MD  OBESITY BEHAVIORAL INTERVENTION VISIT  Today's visit was # 12 out of 22.  Starting weight: 309 lbs Starting date: 12/24/16 Today's weight : 291 lbs Today's date: 06/30/2017 Total lbs lost to date: 57 (Patients must lose 7 lbs in the first 6 months to continue with counseling)   ASK: We discussed the diagnosis of obesity with [name] today and Ravon agreed to give [name] permission to discuss obesity behavioral modification therapy  today.  ASSESS: Eon has the diagnosis of obesity and his BMI today is 44.3 Sameul is in the action stage of change   ADVISE: Berthold was educated on the multiple health risks of obesity as well as the benefit of weight loss to improve his health. He was advised of the need for long term treatment and the importance of lifestyle modifications.  AGREE: Multiple dietary modification options and treatment options were discussed and  [name] agreed to keep a food journal with 1500 to 1800 calories and 85+ grams of protein daily We discussed the following Behavioral Modification Strategies today: increase H2O intake, no skipping meals, increasing lean protein intake and decreasing simple carbohydrates

## 2017-07-14 MED FILL — DILTIAZEM ER 240 MG TABLET: 240 | 30 days supply | Qty: 30 | Fill #0

## 2017-07-14 MED FILL — VIT D2 1.25 MG (50,000 UNIT: 1.25 MG | 28 days supply | Qty: 4 | Fill #0

## 2017-07-21 ENCOUNTER — Ambulatory Visit (INDEPENDENT_AMBULATORY_CARE_PROVIDER_SITE_OTHER): Payer: 59 | Admitting: Family Medicine

## 2017-07-21 VITALS — BP 131/84 | HR 62 | Temp 98.0°F | Ht 68.0 in | Wt 287.0 lb

## 2017-07-21 DIAGNOSIS — Z9189 Other specified personal risk factors, not elsewhere classified: Secondary | ICD-10-CM | POA: Diagnosis not present

## 2017-07-21 DIAGNOSIS — R7303 Prediabetes: Secondary | ICD-10-CM | POA: Diagnosis not present

## 2017-07-21 DIAGNOSIS — E669 Obesity, unspecified: Secondary | ICD-10-CM | POA: Diagnosis not present

## 2017-07-21 DIAGNOSIS — E784 Other hyperlipidemia: Secondary | ICD-10-CM | POA: Diagnosis not present

## 2017-07-21 DIAGNOSIS — E559 Vitamin D deficiency, unspecified: Secondary | ICD-10-CM | POA: Diagnosis not present

## 2017-07-21 DIAGNOSIS — Z6841 Body Mass Index (BMI) 40.0 and over, adult: Secondary | ICD-10-CM

## 2017-07-21 DIAGNOSIS — IMO0001 Reserved for inherently not codable concepts without codable children: Secondary | ICD-10-CM

## 2017-07-21 DIAGNOSIS — E7849 Other hyperlipidemia: Secondary | ICD-10-CM

## 2017-07-21 MED FILL — LEVOCETIRIZINE 5 MG TABLET: 5 | 90 days supply | Qty: 90 | Fill #0 | Status: TO

## 2017-07-21 NOTE — Progress Notes (Signed)
Office: [phone]  /  Fax: [phone]   HPI:   Chief Complaint: OBESITY Daniel [name] is here to discuss his progress with his obesity treatment plan. He is on the keep a food journal with 1500 to 1800 calories and 85+ grams of protein and is following his eating plan approximately 80 % of the time. He states he is exercising cardio and weights for 50 minutes 5 times per week. Daniel [name] has done well with weight loss efforts. He is journaling on and off and he is trying to make better food choices. Daniel [name] is getting ready to do more tailgating and has questions about how to not gain weight during football season. His weight is 287 lb (130.2 kg) today and has had a weight loss of 4 pounds over a period of 3 weeks since his last visit. He has lost 22 lbs since starting treatment with [name].  Vitamin D deficiency Daniel [name] has a diagnosis of vitamin D deficiency. He is not currently taking vit D prescription and is due for labs. He denies nausea, vomiting or muscle weakness.  Hyperlipidemia Daniel [name] has hyperlipidemia and has been attempting to improve his cholesterol levels with intensive lifestyle modification including a low saturated fat diet, exercise and weight loss. He denies any chest pain, claudication or myalgias.  Pre-Diabetes Daniel [name] has a diagnosis of pre-diabetes based on his elevated Hgb A1c and was informed this puts him at greater risk of developing diabetes. He is taking metformin currently and is doing well with the plan. He continues to work on diet and exercise to decrease risk of diabetes. He denies nausea, vomiting or hypoglycemia.  At risk for diabetes Daniel [name] is at higher than average risk for developing diabetes due to his obesity and pre-diabetes. He currently denies polyuria or polydipsia.  ALLERGIES: Allergies  Allergen Reactions   Bee Venom Anaphylaxis   Shellfish Allergy Anaphylaxis    MEDICATIONS: Current Outpatient Prescriptions on File Prior to Visit  Medication  Sig Dispense Refill   albuterol (VENTOLIN HFA) 108 (90 Base) MCG/ACT inhaler INHALE 2 PUFFS INTO THE LUNGS EVERY 4 HOURS AS NEEDED FOR WHEEZING OR SHORTNESS OF BREATH. 18 g 5   diltiazem (CARDIZEM LA) 240 MG 24 hr tablet Take 1 tablet (240 mg total) by mouth daily. 30 tablet 0   EPINEPHrine (EPIPEN 2-PAK) 0.3 mg/0.3 mL SOAJ injection Inject 0.3 mLs (0.3 mg total) into the muscle once. 1 Device 11   levocetirizine (XYZAL) 5 MG tablet Take 1 tablet (5 mg total) by mouth every evening. 90 tablet 3   metFORMIN (GLUCOPHAGE) 500 MG tablet Take 1 tablet (500 mg total) by mouth every morning. 30 tablet 0   montelukast (SINGULAIR) 10 MG tablet Take 1 tablet (10 mg total) by mouth at bedtime. 90 tablet 3   Multiple Vitamin (MULTIVITAMIN WITH MINERALS) TABS Take 1 tablet by mouth daily.     omeprazole (PRILOSEC) 20 MG capsule TAKE 1 CAPSULE BY MOUTH ONCE DAILY 90 capsule 3   Polyvinyl Alcohol-Povidone (CLEAR EYES ALL SEASONS) 5-6 MG/ML SOLN Apply 1 drop to eye once as needed. Reported on 02/01/2016     Vitamin D, Ergocalciferol, (DRISDOL) 50000 units CAPS capsule Take 1 capsule (50,000 Units total) by mouth every 7 (seven) days. 4 capsule 0   No current facility-administered medications on file prior to visit.     PAST MEDICAL HISTORY: Past Medical History:  Diagnosis Date   Asthma    GERD (gastroesophageal reflux disease)    Hypertension     PAST SURGICAL  HISTORY: No past surgical history on file.  SOCIAL HISTORY: Social History  Substance Use Topics   Smoking status: Never Smoker   Smokeless tobacco: Never Used   Alcohol use No    FAMILY HISTORY: Family History  Problem Relation Age of Onset   Hyperlipidemia Unknown    Hypertension Unknown    Stroke Unknown    Hypertension Mother    Hyperlipidemia Mother    Obesity Mother    Sudden death Father     ROS: Review of Systems  Constitutional: Positive for weight loss.  Cardiovascular: Negative for chest pain  and claudication.  Gastrointestinal: Negative for nausea and vomiting.  Genitourinary: Negative for frequency.  Musculoskeletal: Negative for myalgias.       Negative muscle weakness  Endo/Heme/Allergies: Negative for polydipsia.       Negative hypoglycemia    PHYSICAL EXAM: Blood pressure 131/84, pulse 62, temperature 98 F (36.7 C), temperature source Oral, height 5\' 8"  (1.727 m), weight 287 lb (130.2 kg), SpO2 98 %. Body mass index is 43.64 kg/m. Physical Exam  Constitutional: He is oriented to person, place, and time. He appears well-developed and well-nourished.  Cardiovascular: Normal rate.   Pulmonary/Chest: Effort normal.  Musculoskeletal: Normal range of motion.  Neurological: He is oriented to person, place, and time.  Skin: Skin is warm and dry.  Psychiatric: He has a normal mood and affect. His behavior is normal.  Vitals reviewed.   RECENT LABS AND TESTS: BMET    Component Value Date/Time   NA 143 04/08/2017 0934   K 3.8 04/08/2017 0934   CL 102 04/08/2017 0934   CO2 26 04/08/2017 0934   GLUCOSE 88 04/08/2017 0934   GLUCOSE 103 (H) 11/07/2015 1454   BUN 18 04/08/2017 0934   CREATININE 1.18 04/08/2017 0934   CALCIUM 9.7 04/08/2017 0934   GFRNONAA 75 04/08/2017 0934   GFRAA 87 04/08/2017 0934   Lab Results  Component Value Date   HGBA1C 5.6 04/08/2017   HGBA1C 5.5 12/24/2016   Lab Results  Component Value Date   INSULIN 28.9 (H) 04/08/2017   INSULIN 25.8 (H) 12/24/2016   CBC    Component Value Date/Time   WBC 6.6 12/24/2016 1021   WBC 10.2 11/07/2015 1454   RBC 5.47 12/24/2016 1021   RBC 5.67 11/07/2015 1454   HGB 15.2 12/24/2016 1021   HCT 44.3 12/24/2016 1021   PLT 300.0 11/07/2015 1454   MCV 81 12/24/2016 1021   MCH 27.8 12/24/2016 1021   MCH 28.0 07/18/2012 2030   MCHC 34.3 12/24/2016 1021   MCHC 33.3 11/07/2015 1454   RDW 14.6 12/24/2016 1021   LYMPHSABS 2.7 12/24/2016 1021   MONOABS 0.9 11/07/2015 1454   EOSABS 0.2 12/24/2016 1021     BASOSABS 0.1 12/24/2016 1021   Iron/TIBC/Ferritin/ %Sat No results found for: IRON, TIBC, FERRITIN, IRONPCTSAT Lipid Panel     Component Value Date/Time   CHOL 204 (H) 04/08/2017 0934   TRIG 88 04/08/2017 0934   HDL 37 (L) 04/08/2017 0934   CHOLHDL 5 03/17/2015 0841   VLDL 22.8 03/17/2015 0841   LDLCALC 149 (H) 04/08/2017 0934   Hepatic Function Panel     Component Value Date/Time   PROT 7.6 04/08/2017 0934   ALBUMIN 4.3 04/08/2017 0934   AST 18 04/08/2017 0934   ALT 25 04/08/2017 0934   ALKPHOS 59 04/08/2017 0934   BILITOT 0.4 04/08/2017 0934   BILIDIR 0.1 11/07/2015 1454      Component Value Date/Time  TSH 1.370 12/24/2016 1021   TSH 1.39 03/17/2015 0841   TSH 1.58 03/02/2014 0911    ASSESSMENT AND PLAN: Vitamin D deficiency - Plan: VITAMIN D 25 Hydroxy (Vit-D Deficiency, Fractures)  Prediabetes - Plan: Comprehensive metabolic panel, Hemoglobin A1c, Insulin, random  Other hyperlipidemia - Plan: Lipid Panel With LDL/HDL Ratio  At risk for diabetes mellitus  Class 3 obesity without serious comorbidity with body mass index (BMI) of 40.0 to 44.9 in adult, unspecified obesity type (HCC)  PLAN:  Vitamin D Deficiency Bettye was informed that low vitamin D levels contributes to fatigue and are associated with obesity, breast, and colon cancer. We will check labs and will follow up for routine testing of vitamin D, at least 2-3 times per year. He was informed of the risk of over-replacement of vitamin D and agrees to not increase his dose unless he discusses this with [name] first. Tesla agrees to follow up with our clinic in 2 to 3 weeks.  Hyperlipidemia Beckam was informed of the American Heart Association Guidelines emphasizing intensive lifestyle modifications as the first line treatment for hyperlipidemia. We discussed many lifestyle modifications today in depth, and Antwand will continue to work on decreasing saturated fats such as fatty red meat, butter and many fried  foods. He will also increase vegetables and lean protein in his diet and continue to work on exercise and weight loss efforts. We will check labs and Kyeson agrees to follow up with our clinic in 2 to 3 weeks.  Pre-Diabetes Calan will continue to work on weight loss, exercise, and decreasing simple carbohydrates in his diet to help decrease the risk of diabetes. We dicussed metformin including benefits and risks. He was informed that eating too many simple carbohydrates or too many calories at one sitting increases the likelihood of GI side effects. Vitold agrees to metformin for now and a prescription was not written today. We will check labs and Cheikh agreed to follow up with [name] as directed to monitor his progress.  Diabetes risk counselling Maximino was given extended (15 minutes) diabetes prevention counseling today. He is 46 y.o. male and has risk factors for diabetes including obesity and pre-diabetes. We discussed intensive lifestyle modifications today with an emphasis on weight loss as well as increasing exercise and decreasing simple carbohydrates in his diet.  Obesity Arol is currently in the action stage of change. As such, his goal is to continue with weight loss efforts He has agreed to keep a food journal with 1500 to 1800 calories and 85+ grams of protein daily Pinchas has been instructed to work up to a goal of 150 minutes of combined cardio and strengthening exercise per week for weight loss and overall health benefits. We discussed the following Behavioral Modification Strategies today: meal planning & cooking strategies, travel eating strategies, celebration eating strategies and planning for success.  Ercil has agreed to follow up with our clinic in 2 to 3 weeks. He was informed of the importance of frequent follow up visits to maximize his success with intensive lifestyle modifications for his multiple health conditions.  I, [place] Crane, am acting as transcriptionist for  Quillian Quince, MD  I have reviewed the above documentation for accuracy and completeness, and I agree with the above. -Quillian Quince, MD  OBESITY BEHAVIORAL INTERVENTION VISIT  Today's visit was # 13 out of 22.  Starting weight: 309 lbs Starting date: 12/24/16 Today's weight : 287 lbs Today's date: 07/21/2017 Total lbs lost to date: 22 (Patients must lose 7  lbs in the first 6 months to continue with counseling)   ASK: We discussed the diagnosis of obesity with [name] today and Eren agreed to give [name] permission to discuss obesity behavioral modification therapy today.  ASSESS: Lucan has the diagnosis of obesity and his BMI today is 43.65 Broghan is in the action stage of change   ADVISE: Shahan was educated on the multiple health risks of obesity as well as the benefit of weight loss to improve his health. He was advised of the need for long term treatment and the importance of lifestyle modifications.  AGREE: Multiple dietary modification options and treatment options were discussed and  Rakwon agreed to keep a food journal with 1500 to 1800 calories and 85+ grams of protein daily. We discussed the following Behavioral Modification Strategies today: meal planning & cooking strategies, travel eating strategies, celebration eating strategies and planning for success.

## 2017-08-06 DIAGNOSIS — R7303 Prediabetes: Secondary | ICD-10-CM | POA: Diagnosis not present

## 2017-08-06 DIAGNOSIS — E559 Vitamin D deficiency, unspecified: Secondary | ICD-10-CM | POA: Diagnosis not present

## 2017-08-06 DIAGNOSIS — E784 Other hyperlipidemia: Secondary | ICD-10-CM | POA: Diagnosis not present

## 2017-08-07 LAB — COMPREHENSIVE METABOLIC PANEL
ALBUMIN: 4.3 g/dL (ref 3.5–5.5)
ALT: 25 IU/L (ref 0–44)
AST: 18 IU/L (ref 0–40)
Albumin/Globulin Ratio: 1.3 (ref 1.2–2.2)
Alkaline Phosphatase: 66 IU/L (ref 39–117)
BUN / CREAT RATIO: 12 (ref 9–20)
BUN: 13 mg/dL (ref 6–24)
Bilirubin Total: 0.3 mg/dL (ref 0.0–1.2)
CALCIUM: 9.1 mg/dL (ref 8.7–10.2)
CHLORIDE: 105 mmol/L (ref 96–106)
CO2: 25 mmol/L (ref 20–29)
CREATININE: 1.13 mg/dL (ref 0.76–1.27)
GFR calc non Af Amer: 79 mL/min/{1.73_m2} (ref >59)
GFR, EST AFRICAN AMERICAN: 91 mL/min/{1.73_m2} (ref >59)
GLUCOSE: 80 mg/dL (ref 65–99)
Globulin, Total: 3.2 g/dL (ref 1.5–4.5)
Potassium: 4.9 mmol/L (ref 3.5–5.2)
Sodium: 143 mmol/L (ref 134–144)
TOTAL PROTEIN: 7.5 g/dL (ref 6.0–8.5)

## 2017-08-07 LAB — VITAMIN D 25 HYDROXY (VIT D DEFICIENCY, FRACTURES): Vit D, 25-Hydroxy: 37.1 ng/mL (ref 30.0–100.0)

## 2017-08-07 LAB — LIPID PANEL WITH LDL/HDL RATIO
CHOLESTEROL TOTAL: 182 mg/dL (ref 100–199)
HDL: 39 mg/dL — ABNORMAL LOW (ref >39)
LDL CALC: 128 mg/dL — AB (ref 0–99)
LDl/HDL Ratio: 3.3 ratio (ref 0.0–3.6)
TRIGLYCERIDES: 73 mg/dL (ref 0–149)
VLDL CHOLESTEROL CAL: 15 mg/dL (ref 5–40)

## 2017-08-07 LAB — HEMOGLOBIN A1C
Est. average glucose Bld gHb Est-mCnc: 105 mg/dL
HEMOGLOBIN A1C: 5.3 % (ref 4.8–5.6)

## 2017-08-07 LAB — INSULIN, RANDOM: INSULIN: 27.6 u[IU]/mL — AB (ref 2.6–24.9)

## 2017-08-07 MED FILL — MONTELUKAST SOD 10 MG TAB: 10 | 90 days supply | Qty: 90 | Fill #2

## 2017-08-11 ENCOUNTER — Ambulatory Visit (INDEPENDENT_AMBULATORY_CARE_PROVIDER_SITE_OTHER): Payer: 59 | Admitting: Family Medicine

## 2017-08-11 VITALS — BP 142/88 | HR 69 | Temp 98.5°F | Ht 68.0 in | Wt 286.0 lb

## 2017-08-11 DIAGNOSIS — E559 Vitamin D deficiency, unspecified: Secondary | ICD-10-CM

## 2017-08-11 DIAGNOSIS — I10 Essential (primary) hypertension: Secondary | ICD-10-CM | POA: Diagnosis not present

## 2017-08-11 DIAGNOSIS — Z6841 Body Mass Index (BMI) 40.0 and over, adult: Secondary | ICD-10-CM | POA: Diagnosis not present

## 2017-08-11 DIAGNOSIS — Z9189 Other specified personal risk factors, not elsewhere classified: Secondary | ICD-10-CM

## 2017-08-11 DIAGNOSIS — E669 Obesity, unspecified: Secondary | ICD-10-CM | POA: Diagnosis not present

## 2017-08-11 DIAGNOSIS — IMO0001 Reserved for inherently not codable concepts without codable children: Secondary | ICD-10-CM

## 2017-08-11 MED ORDER — VITAMIN D (ERGOCALCIFEROL) 1.25 MG (50000 UNIT) PO CAPS: 50000.0000 [IU] | ORAL_CAPSULE | ORAL | 0 refills | Status: DC

## 2017-08-11 MED FILL — VIT D2 1.25 MG (50,000 UNIT: 1.25 MG | 28 days supply | Qty: 4 | Fill #0

## 2017-08-11 NOTE — Progress Notes (Signed)
Office: [phone]  /  Fax: [phone]   HPI:   Chief Complaint: OBESITY Daniel [name] is here to discuss his progress with his obesity treatment plan. He is on the keep a food journal with 1500 to 1800 calories and 85+ grams of protein daily and is following his eating plan approximately 85 % of the time. He states he is exercising cardio and strength exercises for 60 minutes 5 times per week. Daniel [name] continues to do well with weight loss going between the category 3 plan and journaling. He is working on increasing protein and vegetables. His weight is 286 lb (129.7 kg) today and has had a weight loss of 1 pound over a period of 3 weeks since his last visit. He has lost 23 lbs since starting treatment with [name].  Vitamin D deficiency Daniel [name] has a diagnosis of vitamin D deficiency. He is currently taking vit D and denies nausea, vomiting or muscle weakness.  Hypertension Daniel [name] is a 46 y.o. male with hypertension. His blood pressure is elevated today and had a stressful call today and feels that is why his blood pressure is elevated.  Daniel [name] denies chest pain, headache or shortness of breath on exertion. He is working weight loss to help control his blood pressure with the goal of decreasing his risk of heart attack and stroke. Daniel [name] blood pressure is not currently controlled.  At risk for cardiovascular disease Daniel [name] is at a higher than average risk for cardiovascular disease due to obesity and hypertension. He currently denies any chest pain.   ALLERGIES: Allergies  Allergen Reactions  . Bee Venom Anaphylaxis  . Shellfish Allergy Anaphylaxis    MEDICATIONS: Current Outpatient Prescriptions on File Prior to Visit  Medication Sig Dispense Refill  . albuterol (VENTOLIN HFA) 108 (90 Base) MCG/ACT inhaler INHALE 2 PUFFS INTO THE LUNGS EVERY 4 HOURS AS NEEDED FOR WHEEZING OR SHORTNESS OF BREATH. 18 g 5  . diltiazem (CARDIZEM LA) 240 MG 24 hr tablet Take 1 tablet (240 mg  total) by mouth daily. 30 tablet 0  . EPINEPHrine (EPIPEN 2-PAK) 0.3 mg/0.3 mL SOAJ injection Inject 0.3 mLs (0.3 mg total) into the muscle once. 1 Device 11  . levocetirizine (XYZAL) 5 MG tablet Take 1 tablet (5 mg total) by mouth every evening. 90 tablet 3  . montelukast (SINGULAIR) 10 MG tablet Take 1 tablet (10 mg total) by mouth at bedtime. 90 tablet 3  . Multiple Vitamin (MULTIVITAMIN WITH MINERALS) TABS Take 1 tablet by mouth daily.    [name] omeprazole (PRILOSEC) 20 MG capsule TAKE 1 CAPSULE BY MOUTH ONCE DAILY 90 capsule 3  . Polyvinyl Alcohol-Povidone (CLEAR EYES ALL SEASONS) 5-6 MG/ML SOLN Apply 1 drop to eye once as needed. Reported on 02/01/2016     No current facility-administered medications on file prior to visit.     PAST MEDICAL HISTORY: Past Medical History:  Diagnosis Date  . Asthma   . GERD (gastroesophageal reflux disease)   . Hypertension     PAST SURGICAL HISTORY: No past surgical history on file.  SOCIAL HISTORY: Social History  Substance Use Topics  . Smoking status: Never Smoker  . Smokeless tobacco: Never Used  . Alcohol use No    FAMILY HISTORY: Family History  Problem Relation Age of Onset  . Hyperlipidemia Unknown   . Hypertension Unknown   . Stroke Unknown   . Hypertension Mother   . Hyperlipidemia Mother   . Obesity Mother   . Sudden death Father  ROS: Review of Systems  Constitutional: Positive for weight loss.  Respiratory: Negative for shortness of breath (on exertion).   Cardiovascular: Negative for chest pain.  Gastrointestinal: Negative for nausea and vomiting.  Musculoskeletal:       Negative muscle weakness  Neurological: Negative for headaches.    PHYSICAL EXAM: Blood pressure (!) 142/88, pulse 69, temperature 98.5 F (36.9 C), temperature source Oral, height  (1.727 m), weight 286 lb (129.7 kg), SpO2 96 %. Body mass index is 43.49 kg/m. Physical Exam  Constitutional: He is oriented to person, place, and time. He  appears well-developed and well-nourished.  Cardiovascular: Normal rate.   Pulmonary/Chest: Effort normal.  Musculoskeletal: Normal range of motion.  Neurological: He is oriented to person, place, and time.  Skin: Skin is warm and dry.  Psychiatric: He has a normal mood and affect. His behavior is normal.  Vitals reviewed.   RECENT LABS AND TESTS: BMET    Component Value Date/Time   NA 143 08/06/2017 1102   K 4.9 08/06/2017 1102   CL 105 08/06/2017 1102   CO2 25 08/06/2017 1102   GLUCOSE 80 08/06/2017 1102   GLUCOSE 103 (H) 11/07/2015 1454   BUN 13 08/06/2017 1102   CREATININE 1.13 08/06/2017 1102   CALCIUM 9.1 08/06/2017 1102   GFRNONAA 79 08/06/2017 1102   GFRAA 91 08/06/2017 1102   Lab Results  Component Value Date   HGBA1C 5.3 08/06/2017   HGBA1C 5.6 04/08/2017   HGBA1C 5.5 12/24/2016   Lab Results  Component Value Date   INSULIN 27.6 (H) 08/06/2017   INSULIN 28.9 (H) 04/08/2017   INSULIN 25.8 (H) 12/24/2016   CBC    Component Value Date/Time   WBC 6.6 12/24/2016 1021   WBC 10.2 11/07/2015 1454   RBC 5.47 12/24/2016 1021   RBC 5.67 11/07/2015 1454   HGB 15.2 12/24/2016 1021   HCT 44.3 12/24/2016 1021   PLT 300.0 11/07/2015 1454   MCV 81 12/24/2016 1021   MCH 27.8 12/24/2016 1021   MCH 28.0 07/18/2012 2030   MCHC 34.3 12/24/2016 1021   MCHC 33.3 11/07/2015 1454   RDW 14.6 12/24/2016 1021   LYMPHSABS 2.7 12/24/2016 1021   MONOABS 0.9 11/07/2015 1454   EOSABS 0.2 12/24/2016 1021   BASOSABS 0.1 12/24/2016 1021   Iron/TIBC/Ferritin/ %Sat No results found for: IRON, TIBC, FERRITIN, IRONPCTSAT Lipid Panel     Component Value Date/Time   CHOL 182 08/06/2017 1102   TRIG 73 08/06/2017 1102   HDL 39 (L) 08/06/2017 1102   CHOLHDL 5 03/17/2015 0841   VLDL 22.8 03/17/2015 0841   LDLCALC 128 (H) 08/06/2017 1102   Hepatic Function Panel     Component Value Date/Time   PROT 7.5 08/06/2017 1102   ALBUMIN 4.3 08/06/2017 1102   AST 18 08/06/2017 1102   ALT  25 08/06/2017 1102   ALKPHOS 66 08/06/2017 1102   BILITOT 0.3 08/06/2017 1102   BILIDIR 0.1 11/07/2015 1454      Component Value Date/Time   TSH 1.370 12/24/2016 1021   TSH 1.39 03/17/2015 0841   TSH 1.58 03/02/2014 0911    ASSESSMENT AND PLAN: Vitamin D deficiency - Plan: Vitamin D, Ergocalciferol, (DRISDOL) 50000 units CAPS capsule  Essential hypertension  At risk for heart disease  Class 3 obesity with serious comorbidity and body mass index (BMI) of 40.0 to 44.9 in adult, unspecified obesity type (HCC)  PLAN:  Vitamin D Deficiency Daniel [name] was informed that low vitamin D levels contributes to fatigue  and are associated with obesity, breast, and colon cancer. He agrees to continue to take prescription Vit D ,000 IU every week, we will refill for 1 month and will follow up for routine testing of vitamin D, at least 2-3 times per year. He was informed of the risk of over-replacement of vitamin D and agrees to not increase his dose unless he discusses this with [name] first. Daniel [name] agrees to follow up with our clinic in 3 weeks.  Hypertension We discussed sodium restriction, working on healthy weight loss, and a regular exercise program as the means to achieve improved blood pressure control. Daniel [name] agreed with this plan and agreed to follow up as directed. We will continue to monitor his blood pressure as well as his progress with the above lifestyle modifications. He will continue his medications as prescribed and will watch for signs of hypotension as he continues his lifestyle modifications.  Cardiovascular risk counseling Daniel [name] was given extended (15 minutes) coronary artery disease prevention counseling today. He is 46 y.o. male and has risk factors for heart disease including obesity and hypertension. We discussed intensive lifestyle modifications today with an emphasis on specific weight loss instructions and strategies. Pt was also informed of the importance of increasing  exercise and decreasing saturated fats to help prevent heart disease.  Obesity Daniel [name] is currently in the action stage of change. As such, his goal is to continue with weight loss efforts He has agreed to keep a food journal with 1500 to 1800 calories and 100 grams of protein daily Daniel [name] has been instructed to work up to a goal of 150 minutes of combined cardio and strengthening exercise per week for weight loss and overall health benefits. We discussed the following Behavioral Modification Strategies today: keeping healthy foods in the home, increasing lean protein intake, decreasing simple carbohydrates  and holiday eating strategies   Daniel [name] has agreed to follow up with our clinic in 3 weeks. He was informed of the importance of frequent follow up visits to maximize his success with intensive lifestyle modifications for his multiple health conditions.  I, Daniel [name], am acting as transcriptionist for Daniel Quince, MD  I have reviewed the above documentation for accuracy and completeness, and I agree with the above. -Daniel Quince, MD   OBESITY BEHAVIORAL INTERVENTION VISIT  Today's visit was # 14 out of 22.  Starting weight: 309 lbs Starting date: 12/24/16 Today's weight : 286 lbs Today's date: 08/11/2017 Total lbs lost to date: 23 (Patients must lose 7 lbs in the first 6 months to continue with counseling)   ASK: We discussed the diagnosis of obesity with [name] today and Singleton agreed to give [name] permission to discuss obesity behavioral modification therapy today.  ASSESS: [place] has the diagnosis of obesity and his BMI today is 43.5 Khamari is in the action stage of change   ADVISE: Danile was educated on the multiple health risks of obesity as well as the benefit of weight loss to improve his health. He was advised of the need for long term treatment and the importance of lifestyle modifications.  AGREE: Multiple dietary modification options and treatment  options were discussed and  Kaan agreed to keep a food journal with 1500 to 1800 calories and 100 grams of protein daily We discussed the following Behavioral Modification Strategies today: keeping healthy foods in the home, increasing lean protein intake, decreasing simple carbohydrates  and holiday eating strategies

## 2017-08-14 ENCOUNTER — Encounter: Payer: Self-pay | Admitting: Family Medicine

## 2017-09-01 ENCOUNTER — Ambulatory Visit (INDEPENDENT_AMBULATORY_CARE_PROVIDER_SITE_OTHER): Payer: 59 | Admitting: Family Medicine

## 2017-09-01 VITALS — BP 120/79 | HR 71 | Temp 99.0°F | Ht 68.0 in | Wt 287.0 lb

## 2017-09-01 DIAGNOSIS — Z9189 Other specified personal risk factors, not elsewhere classified: Secondary | ICD-10-CM

## 2017-09-01 DIAGNOSIS — E559 Vitamin D deficiency, unspecified: Secondary | ICD-10-CM | POA: Diagnosis not present

## 2017-09-01 DIAGNOSIS — I10 Essential (primary) hypertension: Secondary | ICD-10-CM | POA: Diagnosis not present

## 2017-09-01 DIAGNOSIS — Z6841 Body Mass Index (BMI) 40.0 and over, adult: Secondary | ICD-10-CM | POA: Diagnosis not present

## 2017-09-01 MED ORDER — DILTIAZEM HCL ER COATED BEADS 240 MG PO TB24: 240.0000 mg | ORAL_TABLET | Freq: Every day | ORAL | 0 refills | Status: DC

## 2017-09-01 MED ORDER — VITAMIN D (ERGOCALCIFEROL) 1.25 MG (50000 UNIT) PO CAPS: 50000.0000 [IU] | ORAL_CAPSULE | ORAL | 0 refills | Status: DC

## 2017-09-02 NOTE — Progress Notes (Signed)
Office: [phone]  /  Fax: [phone]   HPI:   Chief Complaint: OBESITY Daniel [name] is here to discuss his progress with his obesity treatment plan. He is on the keep a food journal with 1500 to 1800 calories and 100 grams of protein daily and is following his eating plan approximately 80 % of the time. He states he is exercising cardio and weights for 60 minutes 5 times per week. Kvon is retaining some fluid. He has had increased celebration eating with the start of football season. His weight is 287 lb (130.2 kg) today and has had a weight gain of 1 lb over a period of 3 weeks since his last visit. He has lost 22 lbs since starting treatment with [name].  Vitamin D deficiency Haroon has a diagnosis of vitamin D deficiency. He is currently stable on vit D and level had decreased when off. He denies nausea, vomiting or muscle weakness.  Hypertension DORAL VENTRELLA is a 46 y.o. male with hypertension.  Arin Peral Garretson denies chest pain or shortness of breath on exertion. He is working weight loss to help control his blood pressure with the goal of decreasing his risk of heart attack and stroke. [name] blood pressure is currently well controlled on Diltiazem.   ALLERGIES: Allergies  Allergen Reactions   Bee Venom Anaphylaxis   Shellfish Allergy Anaphylaxis    MEDICATIONS: Current Outpatient Prescriptions on File Prior to Visit  Medication Sig Dispense Refill   albuterol (VENTOLIN HFA) 108 (90 Base) MCG/ACT inhaler INHALE 2 PUFFS INTO THE LUNGS EVERY 4 HOURS AS NEEDED FOR WHEEZING OR SHORTNESS OF BREATH. 18 g 5   EPINEPHrine (EPIPEN 2-PAK) 0.3 mg/0.3 mL SOAJ injection Inject 0.3 mLs (0.3 mg total) into the muscle once. 1 Device 11   levocetirizine (XYZAL) 5 MG tablet Take 1 tablet (5 mg total) by mouth every evening. 90 tablet 3   montelukast (SINGULAIR) 10 MG tablet Take 1 tablet (10 mg total) by mouth at bedtime. 90 tablet 3   Multiple Vitamin (MULTIVITAMIN WITH MINERALS) TABS  Take 1 tablet by mouth daily.     omeprazole (PRILOSEC) 20 MG capsule TAKE 1 CAPSULE BY MOUTH ONCE DAILY 90 capsule 3   Polyvinyl Alcohol-Povidone (CLEAR EYES ALL SEASONS) 5-6 MG/ML SOLN Apply 1 drop to eye once as needed. Reported on 02/01/2016     No current facility-administered medications on file prior to visit.     PAST MEDICAL HISTORY: Past Medical History:  Diagnosis Date   Asthma    GERD (gastroesophageal reflux disease)    Hypertension     PAST SURGICAL HISTORY: No past surgical history on file.  SOCIAL HISTORY: Social History  Substance Use Topics   Smoking status: Never Smoker   Smokeless tobacco: Never Used   Alcohol use No    FAMILY HISTORY: Family History  Problem Relation Age of Onset   Hyperlipidemia Unknown    Hypertension Unknown    Stroke Unknown    Hypertension Mother    Hyperlipidemia Mother    Obesity Mother    Sudden death Father     ROS: Review of Systems  Constitutional: Negative for weight loss.  Respiratory: Negative for shortness of breath (on exertion).   Cardiovascular: Negative for chest pain.  Gastrointestinal: Negative for nausea and vomiting.  Musculoskeletal:       Negative muscle weakness    PHYSICAL EXAM: Blood pressure 120/79, pulse 71, temperature 99 F (37.2 C), temperature source Oral, height  (1.727 m), weight 287  lb (130.2 kg), SpO2 97 %. Body mass index is 43.64 kg/m. Physical Exam  Constitutional: He is oriented to person, place, and time. He appears well-developed and well-nourished.  Cardiovascular: Normal rate.   Pulmonary/Chest: Effort normal.  Musculoskeletal: Normal range of motion.  Neurological: He is oriented to person, place, and time.  Skin: Skin is warm and dry.  Psychiatric: He has a normal mood and affect. His behavior is normal.  Vitals reviewed.   RECENT LABS AND TESTS: BMET    Component Value Date/Time   NA 143 08/06/2017 1102   K 4.9 08/06/2017 1102   CL 105  08/06/2017 1102   CO2 25 08/06/2017 1102   GLUCOSE 80 08/06/2017 1102   GLUCOSE 103 (H) 11/07/2015 1454   BUN 13 08/06/2017 1102   CREATININE 1.13 08/06/2017 1102   CALCIUM 9.1 08/06/2017 1102   GFRNONAA 79 08/06/2017 1102   GFRAA 91 08/06/2017 1102   Lab Results  Component Value Date   HGBA1C 5.3 08/06/2017   HGBA1C 5.6 04/08/2017   HGBA1C 5.5 12/24/2016   Lab Results  Component Value Date   INSULIN 27.6 (H) 08/06/2017   INSULIN 28.9 (H) 04/08/2017   INSULIN 25.8 (H) 12/24/2016   CBC    Component Value Date/Time   WBC 6.6 12/24/2016 1021   WBC 10.2 11/07/2015 1454   RBC 5.47 12/24/2016 1021   RBC 5.67 11/07/2015 1454   HGB 15.2 12/24/2016 1021   HCT 44.3 12/24/2016 1021   PLT 300.0 11/07/2015 1454   MCV 81 12/24/2016 1021   MCH 27.8 12/24/2016 1021   MCH 28.0 07/18/2012 2030   MCHC 34.3 12/24/2016 1021   MCHC 33.3 11/07/2015 1454   RDW 14.6 12/24/2016 1021   LYMPHSABS 2.7 12/24/2016 1021   MONOABS 0.9 11/07/2015 1454   EOSABS 0.2 12/24/2016 1021   BASOSABS 0.1 12/24/2016 1021   Iron/TIBC/Ferritin/ %Sat No results found for: IRON, TIBC, FERRITIN, IRONPCTSAT Lipid Panel     Component Value Date/Time   CHOL 182 08/06/2017 1102   TRIG 73 08/06/2017 1102   HDL 39 (L) 08/06/2017 1102   CHOLHDL 5 03/17/2015 0841   VLDL 22.8 03/17/2015 0841   LDLCALC 128 (H) 08/06/2017 1102   Hepatic Function Panel     Component Value Date/Time   PROT 7.5 08/06/2017 1102   ALBUMIN 4.3 08/06/2017 1102   AST 18 08/06/2017 1102   ALT 25 08/06/2017 1102   ALKPHOS 66 08/06/2017 1102   BILITOT 0.3 08/06/2017 1102   BILIDIR 0.1 11/07/2015 1454      Component Value Date/Time   TSH 1.370 12/24/2016 1021   TSH 1.39 03/17/2015 0841   TSH 1.58 03/02/2014 0911    ASSESSMENT AND PLAN: Essential hypertension - Plan: diltiazem (CARDIZEM LA) 240 MG 24 hr tablet  Vitamin D deficiency - Plan: Vitamin D, Ergocalciferol, (DRISDOL) 50000 units CAPS capsule  Class 3 severe obesity with  serious comorbidity and body mass index (BMI) of 40.0 to 44.9 in adult, unspecified obesity type (HCC)  PLAN:  Vitamin D Deficiency Author was informed that low vitamin D levels contributes to fatigue and are associated with obesity, breast, and colon cancer. He agrees to continue to take prescription Vit D ,000 IU every week #4 with no refills and will follow up for routine testing of vitamin D, at least 2-3 times per year. He was informed of the risk of over-replacement of vitamin D and agrees to not increase his dose unless he discusses this with [name] first. Constantinos agrees to  follow up with our clinic in 2 weeks.  Hypertension We discussed sodium restriction, working on healthy weight loss, and a regular exercise program as the means to achieve improved blood pressure control. Kareem agreed with this plan and agreed to follow up as directed. We will continue to monitor his blood pressure as well as his progress with the above lifestyle modifications. He agrees to continue Diltiazem 240 mg qd #30 with no refills and will watch for signs of hypotension as he continues his lifestyle modifications.  Cardiovascular risk counselling Stacy was given extended (15 minutes) coronary artery disease prevention counseling today. He is 46 y.o. male and has risk factors for heart disease including obesity. We discussed intensive lifestyle modifications today with an emphasis on specific weight loss instructions and strategies. Pt was also informed of the importance of increasing exercise and decreasing saturated fats to help prevent heart disease.  Obesity Vinay is currently in the action stage of change. As such, his goal is to continue with weight loss efforts He has agreed to keep a food journal with 1800 calories and 100+ grams of protein daily Don has been instructed to work up to a goal of 150 minutes of combined cardio and strengthening exercise per week for weight loss and overall health  benefits. We discussed the following Behavioral Modification Strategies today: increasing lean protein intake, decreasing simple carbohydrates , holiday eating strategies and celebration eating strategies  Favian has agreed to follow up with our clinic in 2 weeks. He was informed of the importance of frequent follow up visits to maximize his success with intensive lifestyle modifications for his multiple health conditions.  I, [place] Crane, am acting as transcriptionist for Quillian Quince, MD  I have reviewed the above documentation for accuracy and completeness, and I agree with the above. -Quillian Quince, MD  OBESITY BEHAVIORAL INTERVENTION VISIT  Today's visit was # 15 out of 22.  Starting weight: 309 lbs Starting date: 12/24/16 Today's weight : 287 lbs Today's date: 09/01/2017 Total lbs lost to date: 22 (Patients must lose 7 lbs in the first 6 months to continue with counseling)   ASK: We discussed the diagnosis of obesity with [name] today and Sou agreed to give [name] permission to discuss obesity behavioral modification therapy today.  ASSESS: Ilario has the diagnosis of obesity and his BMI today is 43.65 Harout is in the action stage of change   ADVISE: Neshawn was educated on the multiple health risks of obesity as well as the benefit of weight loss to improve his health. He was advised of the need for long term treatment and the importance of lifestyle modifications.  AGREE: Multiple dietary modification options and treatment options were discussed and  Kolin agreed to keep a food journal with 1800 calories and 100+ grams of protein daily We discussed the following Behavioral Modification Strategies today: increasing lean protein intake, decreasing simple carbohydrates , holiday eating strategies and celebration eating strategies

## 2017-09-10 MED FILL — DILTIAZEM ER 240 MG TABLET: 240 | 30 days supply | Qty: 30 | Fill #0

## 2017-09-10 MED FILL — VIT D2 1.25 MG (50,000 UNIT: 1.25 MG | 28 days supply | Qty: 4 | Fill #0

## 2017-09-22 ENCOUNTER — Ambulatory Visit (INDEPENDENT_AMBULATORY_CARE_PROVIDER_SITE_OTHER): Payer: 59 | Admitting: Family Medicine

## 2017-09-22 VITALS — BP 149/87 | HR 75 | Temp 98.6°F | Ht 68.0 in | Wt 291.0 lb

## 2017-09-22 DIAGNOSIS — Z6841 Body Mass Index (BMI) 40.0 and over, adult: Secondary | ICD-10-CM | POA: Diagnosis not present

## 2017-09-22 DIAGNOSIS — I10 Essential (primary) hypertension: Secondary | ICD-10-CM

## 2017-09-22 DIAGNOSIS — Z9189 Other specified personal risk factors, not elsewhere classified: Secondary | ICD-10-CM

## 2017-09-22 DIAGNOSIS — E559 Vitamin D deficiency, unspecified: Secondary | ICD-10-CM

## 2017-09-22 DIAGNOSIS — R7303 Prediabetes: Secondary | ICD-10-CM | POA: Diagnosis not present

## 2017-09-22 MED ORDER — VITAMIN D (ERGOCALCIFEROL) 1.25 MG (50000 UNIT) PO CAPS: 50000.0000 [IU] | ORAL_CAPSULE | ORAL | 0 refills | Status: DC

## 2017-09-22 MED ORDER — METFORMIN HCL 500 MG PO TABS: 500.0000 mg | ORAL_TABLET | Freq: Every day | ORAL | 0 refills | Status: DC

## 2017-09-22 NOTE — Progress Notes (Signed)
Office: [phone]  /  Fax: [phone]   HPI:   Chief Complaint: OBESITY Daniel [name] is here to discuss his progress with his obesity treatment plan. He is on the keep a food journal with 1800 calories and 100+ grams of protein daily and is following his eating plan approximately 30 % of the time. He states he is doing cardio, treadmill, and bike for 60 minutes 2 times per week. Daniel [name] has been off track in the last 2 to 3 weeks with URI and no exercise, and sometimes missing meals. He has done well overall and he is worried about getting off track for the holiday.  His weight is 291 lb (132 kg) today and has gained 4 pounds since his last visit. He has lost 18 lbs since starting treatment with Daniel [name].  Vitamin D deficiency Daniel [name] has a diagnosis of vitamin D deficiency. He is currently taking prescription Vit D and is not yet at goal. He denies nausea, vomiting or muscle weakness.  Pre-Diabetes Daniel [name] has a diagnosis of pre-diabetes based on his elevated Hgb A1c and was informed this puts him at greater risk of developing diabetes. He has been attempting to diet control but he is still struggling with polyphagia. He is not taking metformin currently and continues to work on diet and exercise to decrease risk of diabetes. He denies nausea or hypoglycemia.  At risk for diabetes Daniel [name] is at higher than average risk for developing diabetes due to his obesity and pre-diabetes. He currently denies polyuria or polydipsia.  Hypertension Daniel [name] is a 46 y.o. male with hypertension. His blood pressure is elevated again, he is off chlorthalidone but he is still on diltiazem. Daniel [name] has increased eating out and has gained weight which may have contributed to elevated blood pressure. Daniel [name] denies chest pain or shortness of breath. He is working on weight loss to help control his blood pressure with the goal of decreasing his risk of heart attack and stroke. Daniel [name]'s blood pressure is not  currently controlled.  ALLERGIES: Allergies  Allergen Reactions  . Bee Venom Anaphylaxis  . Shellfish Allergy Anaphylaxis    MEDICATIONS: Current Outpatient Prescriptions on File Prior to Visit  Medication Sig Dispense Refill  . albuterol (VENTOLIN HFA) 108 (90 Base) MCG/ACT inhaler INHALE 2 PUFFS INTO THE LUNGS EVERY 4 HOURS AS NEEDED FOR WHEEZING OR SHORTNESS OF BREATH. 18 g 5  . diltiazem (CARDIZEM LA) 240 MG 24 hr tablet Take 1 tablet (240 mg total) by mouth daily. 30 tablet 0  . EPINEPHrine (EPIPEN 2-PAK) 0.3 mg/0.3 mL SOAJ injection Inject 0.3 mLs (0.3 mg total) into the muscle once. 1 Device 11  . levocetirizine (XYZAL) 5 MG tablet Take 1 tablet (5 mg total) by mouth every evening. 90 tablet 3  . montelukast (SINGULAIR) 10 MG tablet Take 1 tablet (10 mg total) by mouth at bedtime. 90 tablet 3  . Multiple Vitamin (MULTIVITAMIN WITH MINERALS) TABS Take 1 tablet by mouth daily.    [name]. omeprazole (PRILOSEC) 20 MG capsule TAKE 1 CAPSULE BY MOUTH ONCE DAILY 90 capsule 3  . Polyvinyl Alcohol-Povidone (CLEAR EYES ALL SEASONS) 5-6 MG/ML SOLN Apply 1 drop to eye once as needed. Reported on 02/01/2016     No current facility-administered medications on file prior to visit.     PAST MEDICAL HISTORY: Past Medical History:  Diagnosis Date  . Asthma   . GERD (gastroesophageal reflux disease)   . Hypertension     PAST SURGICAL HISTORY: No past  surgical history on file.  SOCIAL HISTORY: Social History  Substance Use Topics  . Smoking status: Never Smoker  . Smokeless tobacco: Never Used  . Alcohol use No    FAMILY HISTORY: Family History  Problem Relation Age of Onset  . Hyperlipidemia Unknown   . Hypertension Unknown   . Stroke Unknown   . Hypertension Mother   . Hyperlipidemia Mother   . Obesity Mother   . Sudden death Father     ROS: Review of Systems  Constitutional: Negative for weight loss.  Respiratory: Negative for shortness of breath.   Cardiovascular: Negative  for chest pain.  Gastrointestinal: Negative for nausea and vomiting.  Genitourinary: Negative for frequency.  Musculoskeletal:       Negative muscle weakness  Endo/Heme/Allergies: Negative for polydipsia.       Positive polyphagia Negative hypoglycemia    PHYSICAL EXAM: Blood pressure (!) 149/87, pulse 75, temperature 98.6 F (37 C), temperature source Oral, height 5\' 8"  (1.727 m), weight 291 lb (132 kg), SpO2 93 %. Body mass index is 44.25 kg/m. Physical Exam  Constitutional: He is oriented to person, place, and time. He appears well-developed and well-nourished.  Cardiovascular: Normal rate.   Pulmonary/Chest: Effort normal.  Musculoskeletal: Normal range of motion.  Neurological: He is oriented to person, place, and time.  Skin: Skin is warm and dry.  Psychiatric: He has a normal mood and affect. His behavior is normal.  Vitals reviewed.   RECENT LABS AND TESTS: BMET    Component Value Date/Time   NA 143 08/06/2017 1102   K 4.9 08/06/2017 1102   CL 105 08/06/2017 1102   CO2 25 08/06/2017 1102   GLUCOSE 80 08/06/2017 1102   GLUCOSE 103 (H) 11/07/2015 1454   BUN 13 08/06/2017 1102   CREATININE 1.13 08/06/2017 1102   CALCIUM 9.1 08/06/2017 1102   GFRNONAA 79 08/06/2017 1102   GFRAA 91 08/06/2017 1102   Lab Results  Component Value Date   HGBA1C 5.3 08/06/2017   HGBA1C 5.6 04/08/2017   HGBA1C 5.5 12/24/2016   Lab Results  Component Value Date   INSULIN 27.6 (H) 08/06/2017   INSULIN 28.9 (H) 04/08/2017   INSULIN 25.8 (H) 12/24/2016   CBC    Component Value Date/Time   WBC 6.6 12/24/2016 1021   WBC 10.2 11/07/2015 1454   RBC 5.47 12/24/2016 1021   RBC 5.67 11/07/2015 1454   HGB 15.2 12/24/2016 1021   HCT 44.3 12/24/2016 1021   PLT 300.0 11/07/2015 1454   MCV 81 12/24/2016 1021   MCH 27.8 12/24/2016 1021   MCH 28.0 07/18/2012 2030   MCHC 34.3 12/24/2016 1021   MCHC 33.3 11/07/2015 1454   RDW 14.6 12/24/2016 1021   LYMPHSABS 2.7 12/24/2016 1021    MONOABS 0.9 11/07/2015 1454   EOSABS 0.2 12/24/2016 1021   BASOSABS 0.1 12/24/2016 1021   Iron/TIBC/Ferritin/ %Sat No results found for: IRON, TIBC, FERRITIN, IRONPCTSAT Lipid Panel     Component Value Date/Time   CHOL 182 08/06/2017 1102   TRIG 73 08/06/2017 1102   HDL 39 (L) 08/06/2017 1102   CHOLHDL 5 03/17/2015 0841   VLDL 22.8 03/17/2015 0841   LDLCALC 128 (H) 08/06/2017 1102   Hepatic Function Panel     Component Value Date/Time   PROT 7.5 08/06/2017 1102   ALBUMIN 4.3 08/06/2017 1102   AST 18 08/06/2017 1102   ALT 25 08/06/2017 1102   ALKPHOS 66 08/06/2017 1102   BILITOT 0.3 08/06/2017 1102   BILIDIR 0.1  11/07/2015 1454      Component Value Date/Time   TSH 1.370 12/24/2016 1021   TSH 1.39 03/17/2015 0841   TSH 1.58 03/02/2014 0911    ASSESSMENT AND PLAN: Vitamin D deficiency - Plan: Vitamin D, Ergocalciferol, (DRISDOL) 50000 units CAPS capsule  Prediabetes - Plan: metFORMIN (GLUCOPHAGE) 500 MG tablet  Essential hypertension  At risk for diabetes mellitus  Class 3 severe obesity with serious comorbidity and body mass index (BMI) of 40.0 to 44.9 in adult, unspecified obesity type (HCC)  PLAN:  Vitamin D Deficiency [place] was informed that low vitamin D levels contributes to fatigue and are associated with obesity, breast, and colon cancer. Daniel [name] agrees to continue taking prescription Vit D @50 ,000 IU every week #4 and we will refill for 1 month. He will follow up for routine testing of vitamin D, at least 2-3 times per year. He was informed of the risk of over-replacement of vitamin D and agrees to not increase his dose unless he discusses this with [name] first. We will recheck labs in 2 months and Daniel [name] agrees to follow up with our clinic in 1 and a half weeks.  Pre-Diabetes Daniel [name] will continue to work on weight loss, exercise, and decreasing simple carbohydrates in his diet to help decrease the risk of diabetes. We dicussed metformin including benefits and  risks. He was informed that eating too many simple carbohydrates or too many calories at one sitting increases the likelihood of GI side effects. Daniel [name] agrees to start metformin 500 mg q AM #30 with no refills. We will recheck labs in 2 months and Daniel [name] agrees to follow up with our clinic in 1 and a half weeks as directed to monitor his progress.  Diabetes risk counselling Daniel [name] was given extended (15 minutes) diabetes prevention counseling today. He is 46 y.o. male and has risk factors for diabetes including obesity and pre-diabetes. We discussed intensive lifestyle modifications today with an emphasis on weight loss as well as increasing exercise and decreasing simple carbohydrates in his diet.  Hypertension We discussed sodium restriction, working on healthy weight loss, and a regular exercise program as the means to achieve improved blood pressure control. Daniel [name] agreed with this plan and agreed to follow up as directed. We will continue to monitor his blood pressure as well as his progress with the above lifestyle modifications. Daniel [name] will continue his medications as prescribed and get back to diet and will watch for signs of hypotension as he continues his lifestyle modifications. Daniel [name] agrees to follow up with our clinic in 1 and a half weeks and we will recheck blood pressure at that time.  Obesity Daniel [name] is currently in the action stage of change. As such, his goal is to continue with weight loss efforts He has agreed to keep a food journal with 1500-1800 calories and 100+ grams of protein daily Landis has been instructed to work up to a goal of 150 minutes of combined cardio and strengthening exercise per week for weight loss and overall health benefits. We discussed the following Behavioral Modification Strategies today: increasing lean protein intake, decreasing simple carbohydrates, work on meal planning and easy cooking plans, and holiday eating strategies    Daniel [name] has agreed to  follow up with our clinic in 1 and a half weeks. He was informed of the importance of frequent follow up visits to maximize his success with intensive lifestyle modifications for his multiple health conditions.  Daniel [name], am acting as transcriptionist for Daniel Quince, MD  I have reviewed the above documentation for accuracy and completeness, and I agree with the above. -Daniel Quince, MD      Today's visit was # 16 out of 22.  Starting weight: 309 lbs Starting date: 12/24/16 Today's weight : 291 lbs  Today's date: 09/22/2017 Total lbs lost to date: 41 (Patients must lose 7 lbs in the first 6 months to continue with counseling)   ASK: We discussed the diagnosis of obesity with Daniel Bond today and [place] agreed to give [name] permission to discuss obesity behavioral modification therapy today.  ASSESS: Daniel [name] has the diagnosis of obesity and his BMI today is 44.26 Daniel [name] is in the action stage of change   ADVISE: Daniel [name] was educated on the multiple health risks of obesity as well as the benefit of weight loss to improve his health. He was advised of the need for long term treatment and the importance of lifestyle modifications.  AGREE: Multiple dietary modification options and treatment options were discussed and  Daniel [name] agreed to keep a food journal with 1500-1800 calories and 100+ grams of protein daily We discussed the following Behavioral Modification Strategies today: increasing lean protein intake, decreasing simple carbohydrates, work on meal planning and easy cooking plans, and holiday eating strategies

## 2017-10-02 ENCOUNTER — Ambulatory Visit (INDEPENDENT_AMBULATORY_CARE_PROVIDER_SITE_OTHER): Payer: 59 | Admitting: Family Medicine

## 2017-10-02 VITALS — BP 137/84 | HR 68 | Temp 98.3°F | Ht 68.0 in | Wt 291.0 lb

## 2017-10-02 DIAGNOSIS — Z6841 Body Mass Index (BMI) 40.0 and over, adult: Secondary | ICD-10-CM

## 2017-10-02 DIAGNOSIS — E559 Vitamin D deficiency, unspecified: Secondary | ICD-10-CM | POA: Diagnosis not present

## 2017-10-02 DIAGNOSIS — F3289 Other specified depressive episodes: Secondary | ICD-10-CM | POA: Diagnosis not present

## 2017-10-02 DIAGNOSIS — Z9189 Other specified personal risk factors, not elsewhere classified: Secondary | ICD-10-CM | POA: Diagnosis not present

## 2017-10-02 MED ORDER — BUPROPION HCL ER (SR) 150 MG PO TB12: 150.0000 mg | ORAL_TABLET | Freq: Every day | ORAL | 0 refills | Status: DC

## 2017-10-02 MED ORDER — VITAMIN D (ERGOCALCIFEROL) 1.25 MG (50000 UNIT) PO CAPS: 50000.0000 [IU] | ORAL_CAPSULE | ORAL | 0 refills | Status: DC

## 2017-10-02 MED FILL — VIT D2 1.25 MG (50,000 UNIT: 1.25 MG | 28 days supply | Qty: 4 | Fill #0

## 2017-10-02 MED FILL — metFORMIN HCL 500 MG TABS: 500 | 30 days supply | Qty: 30 | Fill #0

## 2017-10-02 MED FILL — BUPROPION SR 150 MG TABLET: 150 | 30 days supply | Qty: 30 | Fill #0

## 2017-10-02 NOTE — Progress Notes (Signed)
Office: [phone]  /  Fax: [phone]   HPI:   Chief Complaint: OBESITY Daniel [name] is here to discuss his progress with his obesity treatment plan. He is on the keep a food journal with 1500-1800 calories and 100+ grams of protein daily and is following his eating plan approximately 50-60 % of the time. He states he is doing cardio and weight lifting for 60 minutes 5 times per week. Daniel [name] has done well maintaining weight even with increased celebration eating and still worked to decrease bread and increase lean protein. He is struggling to journal some days and feels more scattered.  His weight is 291 lb (132 kg) today and has not lost weight since his last visit. He has lost 18 lbs since starting treatment with [name].  Vitamin D deficiency Daniel [name] has a diagnosis of vitamin D deficiency. He is stable on prescription Vit D, but not yet at goal. He denies nausea, vomiting or muscle weakness.  At risk for cardiovascular disease Daniel [name] is at a higher than average risk for cardiovascular disease due to obesity. He currently denies any chest pain.  Depression with emotional eating behaviors Daniel [name] struggles with emotional eating and using food for comfort to the extent that it is negatively impacting his health. He often snacks when he is not hungry. Daniel [name] sometimes feels he is out of control and then feels guilty that he made poor food choices. Daniel [name] notes increased stress at work and feeling more scattered and having a difficult time focusing. He is starting to use food for to help him focus. He has been working on behavior modification techniques to help reduce his emotional eating and has been somewhat successful. He shows no sign of suicidal or homicidal ideations.  Depression screen [facility] 2/9 12/24/2016  Decreased Interest 0  Down, Depressed, Hopeless 0  PHQ - 2 Score 0  Altered sleeping 0  Tired, decreased energy 1  Change in appetite 0  Feeling bad or failure about yourself  0  Trouble  concentrating 1  Moving slowly or fidgety/restless 0  Suicidal thoughts 0  PHQ-9 Score 2   ALLERGIES: Allergies  Allergen Reactions  . Bee Venom Anaphylaxis  . Shellfish Allergy Anaphylaxis    MEDICATIONS: Current Outpatient Medications on File Prior to Visit  Medication Sig Dispense Refill  . albuterol (VENTOLIN HFA) 108 (90 Base) MCG/ACT inhaler INHALE 2 PUFFS INTO THE LUNGS EVERY 4 HOURS AS NEEDED FOR WHEEZING OR SHORTNESS OF BREATH. 18 g 5  . diltiazem (CARDIZEM LA) 240 MG 24 hr tablet Take 1 tablet (240 mg total) by mouth daily. 30 tablet 0  . EPINEPHrine (EPIPEN 2-PAK) 0.3 mg/0.3 mL SOAJ injection Inject 0.3 mLs (0.3 mg total) into the muscle once. 1 Device 11  . levocetirizine (XYZAL) 5 MG tablet Take 1 tablet (5 mg total) by mouth every evening. 90 tablet 3  . metFORMIN (GLUCOPHAGE) 500 MG tablet Take 1 tablet (500 mg total) by mouth daily with breakfast. 30 tablet 0  . montelukast (SINGULAIR) 10 MG tablet Take 1 tablet (10 mg total) by mouth at bedtime. 90 tablet 3  . Multiple Vitamin (MULTIVITAMIN WITH MINERALS) TABS Take 1 tablet by mouth daily.    [name] omeprazole (PRILOSEC) 20 MG capsule TAKE 1 CAPSULE BY MOUTH ONCE DAILY 90 capsule 3  . Polyvinyl Alcohol-Povidone (CLEAR EYES ALL SEASONS) 5-6 MG/ML SOLN Apply 1 drop to eye once as needed. Reported on 02/01/2016    . Vitamin D, Ergocalciferol, (DRISDOL) 50000 units CAPS capsule Take 1 capsule (  50,000 Units total) by mouth every 7 (seven) days. 4 capsule 0   No current facility-administered medications on file prior to visit.     PAST MEDICAL HISTORY: Past Medical History:  Diagnosis Date  . Asthma   . GERD (gastroesophageal reflux disease)   . Hypertension     PAST SURGICAL HISTORY: No past surgical history on file.  SOCIAL HISTORY: Social History   Tobacco Use  . Smoking status: Never Smoker  . Smokeless tobacco: Never Used  Substance Use Topics  . Alcohol use: No    Alcohol/week: 0.0 oz  . Drug use: No     FAMILY HISTORY: Family History  Problem Relation Age of Onset  . Hyperlipidemia Unknown   . Hypertension Unknown   . Stroke Unknown   . Hypertension Mother   . Hyperlipidemia Mother   . Obesity Mother   . Sudden death Father     ROS: Review of Systems  Constitutional: Negative for weight loss.  Cardiovascular: Negative for chest pain.  Gastrointestinal: Negative for nausea and vomiting.  Musculoskeletal:       Negative muscle weakness  Psychiatric/Behavioral: Positive for depression. Negative for suicidal ideas.    PHYSICAL EXAM: Blood pressure 137/84, pulse 68, temperature 98.3 F (36.8 C), temperature source Oral, height 5\' 8"  (1.727 m), weight 291 lb (132 kg), SpO2 97 %. Body mass index is 44.25 kg/m. Physical Exam  Constitutional: He is oriented to person, place, and time. He appears well-developed and well-nourished.  Cardiovascular: Normal rate.  Pulmonary/Chest: Effort normal.  Musculoskeletal: Normal range of motion.  Neurological: He is oriented to person, place, and time.  Skin: Skin is warm and dry.  Psychiatric: He has a normal mood and affect.  Vitals reviewed.   RECENT LABS AND TESTS: BMET    Component Value Date/Time   NA 143 08/06/2017 1102   K 4.9 08/06/2017 1102   CL 105 08/06/2017 1102   CO2 25 08/06/2017 1102   GLUCOSE 80 08/06/2017 1102   GLUCOSE 103 (H) 11/07/2015 1454   BUN 13 08/06/2017 1102   CREATININE 1.13 08/06/2017 1102   CALCIUM 9.1 08/06/2017 1102   GFRNONAA 79 08/06/2017 1102   GFRAA 91 08/06/2017 1102   Lab Results  Component Value Date   HGBA1C 5.3 08/06/2017   HGBA1C 5.6 04/08/2017   HGBA1C 5.5 12/24/2016   Lab Results  Component Value Date   INSULIN 27.6 (H) 08/06/2017   INSULIN 28.9 (H) 04/08/2017   INSULIN 25.8 (H) 12/24/2016   CBC    Component Value Date/Time   WBC 6.6 12/24/2016 1021   WBC 10.2 11/07/2015 1454   RBC 5.47 12/24/2016 1021   RBC 5.67 11/07/2015 1454   HGB 15.2 12/24/2016 1021   HCT  44.3 12/24/2016 1021   PLT 300.0 11/07/2015 1454   MCV 81 12/24/2016 1021   MCH 27.8 12/24/2016 1021   MCH 28.0 07/18/2012 2030   MCHC 34.3 12/24/2016 1021   MCHC 33.3 11/07/2015 1454   RDW 14.6 12/24/2016 1021   LYMPHSABS 2.7 12/24/2016 1021   MONOABS 0.9 11/07/2015 1454   EOSABS 0.2 12/24/2016 1021   BASOSABS 0.1 12/24/2016 1021   Iron/TIBC/Ferritin/ %Sat No results found for: IRON, TIBC, FERRITIN, IRONPCTSAT Lipid Panel     Component Value Date/Time   CHOL 182 08/06/2017 1102   TRIG 73 08/06/2017 1102   HDL 39 (L) 08/06/2017 1102   CHOLHDL 5 03/17/2015 0841   VLDL 22.8 03/17/2015 0841   LDLCALC 128 (H) 08/06/2017 1102   Hepatic  Function Panel     Component Value Date/Time   PROT 7.5 08/06/2017 1102   ALBUMIN 4.3 08/06/2017 1102   AST 18 08/06/2017 1102   ALT 25 08/06/2017 1102   ALKPHOS 66 08/06/2017 1102   BILITOT 0.3 08/06/2017 1102   BILIDIR 0.1 11/07/2015 1454      Component Value Date/Time   TSH 1.370 12/24/2016 1021   TSH 1.39 03/17/2015 0841   TSH 1.58 03/02/2014 0911    ASSESSMENT AND PLAN: Vitamin D deficiency - Plan: Vitamin D, Ergocalciferol, (DRISDOL) 50000 units CAPS capsule  Other depression - with emotional eating - Plan: buPROPion (WELLBUTRIN SR) 150 MG 12 hr tablet  At risk for heart disease  Class 3 severe obesity with serious comorbidity and body mass index (BMI) of 40.0 to 44.9 in adult, unspecified obesity type (HCC)  PLAN:  Vitamin D Deficiency Daniel [name] was informed that low vitamin D levels contributes to fatigue and are associated with obesity, breast, and colon cancer. Daniel [name] agrees to continue taking prescription Vit D @50 ,000 IU every week #4 and we will refill for 1 month. He will follow up for routine testing of vitamin D, at least 2-3 times per year. Hewas informed of the risk of over-replacement of vitamin D and agrees to not increase his dose unless he discusses this with us first. Daniel [name] agrees to follow up with our clinic in 3  weeks.  Cardiovascular risk counselling Daniel [name] was given extended (15 minutes) coronary artery disease prevention counseling today. He is 46 y.o. male and has risk factors for heart disease including obesity. We discussed intensive lifestyle modifications today with an emphasis on specific weight loss instructions and strategies. Pt was also informed of the importance of increasing exercise and decreasing saturated fats to help prevent heart disease.  Depression with Emotional Eating Behaviors We discussed behavior modification techniques today to help Daniel [name] deal with his emotional eating and depression. Daniel [name] agrees to start Wellbutrin SR 150 mg q AM #30 with no refills. Daniel [name] agrees to follow up with our clinic in 3 weeks.  Obesity Daniel [name] is currently in the action stage of change. As such, his goal is to continue with weight loss efforts He has agreed to keep a food journal with 1500-1800 calories and 100+ grams of protein daily Daniel [name] has been instructed to work up to a goal of 150 minutes of combined cardio and strengthening exercise per week for weight loss and overall health benefits. We discussed the following Behavioral Modification Strategies today: decrease eating out, work on meal planning and easy cooking plans, holiday eating strategies, and travel eating strategies.    Daniel [name] has agreed to follow up with our clinic in 3 weeks. He was informed of the importance of frequent follow up visits to maximize his success with intensive lifestyle modifications for his multiple health conditions.  I, Burt KnackSharon Laban Orourke, am acting as transcriptionist for Quillian Quincearen Beasley, MD  I have reviewed the above documentation for accuracy and completeness, and I agree with the above. -Quillian Quincearen Beasley, MD      Today's visit was # 17 out of 22.  Starting weight: 309 lbs Starting date: 12/24/16 Today's weight : 291 lbs  Today's date: 10/02/2017 Total lbs lost to date: 2018 (Patients must lose 7 lbs in the  first 6 months to continue with counseling)   ASK: We discussed the diagnosis of obesity with Marchia Bondracey M Purpura today and Daniel [name] agreed to give us permission to discuss obesity behavioral modification therapy today.  ASSESS: Daniel [name] has the  diagnosis of obesity and his BMI today is 44.26 Gabriela is in the action stage of change   ADVISE: Pacey was educated on the multiple health risks of obesity as well as the benefit of weight loss to improve his health. He was advised of the need for long term treatment and the importance of lifestyle modifications.  AGREE: Multiple dietary modification options and treatment options were discussed and  Grainger agreed to keep a food journal with 1500-1800 calories and 100+ grams of protein daily We discussed the following Behavioral Modification Strategies today: decrease eating out, work on meal planning and easy cooking plans, holiday eating strategies, and travel eating strategies.

## 2017-10-23 MED FILL — LEVOCETIRIZINE 5 MG TABLET: 5 | 90 days supply | Qty: 90 | Fill #0

## 2017-10-27 ENCOUNTER — Ambulatory Visit (INDEPENDENT_AMBULATORY_CARE_PROVIDER_SITE_OTHER): Payer: 59 | Admitting: Family Medicine

## 2017-10-27 VITALS — BP 149/76 | HR 75 | Temp 98.0°F | Ht 68.0 in | Wt 294.0 lb

## 2017-10-27 DIAGNOSIS — Z9189 Other specified personal risk factors, not elsewhere classified: Secondary | ICD-10-CM | POA: Diagnosis not present

## 2017-10-27 DIAGNOSIS — F3289 Other specified depressive episodes: Secondary | ICD-10-CM

## 2017-10-27 DIAGNOSIS — Z6841 Body Mass Index (BMI) 40.0 and over, adult: Secondary | ICD-10-CM

## 2017-10-27 DIAGNOSIS — E559 Vitamin D deficiency, unspecified: Secondary | ICD-10-CM | POA: Diagnosis not present

## 2017-10-27 DIAGNOSIS — I10 Essential (primary) hypertension: Secondary | ICD-10-CM | POA: Diagnosis not present

## 2017-10-27 MED ORDER — DILTIAZEM HCL ER COATED BEADS 240 MG PO TB24: 240.0000 mg | ORAL_TABLET | Freq: Every day | ORAL | 0 refills | Status: DC

## 2017-10-27 MED ORDER — BUPROPION HCL ER (SR) 150 MG PO TB12: 150.0000 mg | ORAL_TABLET | Freq: Every day | ORAL | 0 refills | Status: DC

## 2017-10-27 MED ORDER — VITAMIN D (ERGOCALCIFEROL) 1.25 MG (50000 UNIT) PO CAPS: 50000.0000 [IU] | ORAL_CAPSULE | ORAL | 0 refills | Status: DC

## 2017-10-27 MED FILL — DILTIAZEM ER 240 MG TABLET: 240 | 30 days supply | Qty: 30 | Fill #0

## 2017-10-27 MED FILL — BUPROPION SR 150 MG TABLET: 150 | 30 days supply | Qty: 30 | Fill #0

## 2017-10-27 MED FILL — VIT D2 1.25 MG (50,000 UNIT: 1.25 MG | 28 days supply | Qty: 4 | Fill #0

## 2017-10-27 NOTE — Progress Notes (Signed)
Office: [phone]  /  Fax: [phone]   HPI:   Chief Complaint: OBESITY Daniel [name] is here to discuss his progress with his obesity treatment plan. He is on the keep a food journal with 1500 to 1800 calories and 100+ grams of protein daily and is following his eating plan approximately 60 % of the time. He states he is exercising cardio and resistance training for  45 minutes 5 times per week. Daniel [name] had increased celebration eating over Thanksgiving. Daniel [name] has not done much journaling and has had increased temptations. His weight is 294 lb (133.4 kg) today and has had a weight gain of 3 pounds over a period of 3 weeks since his last visit. He has lost 15 lbs since starting treatment with us.  Hypertension Daniel [name] M Ernster is a 46 y.o. male with hypertension. His blood pressure is elevated today at 149/76 and has been out of medications for 2 weeks. Daniel [name] denies chest pain, headache or shortness of breath on exertion. He is working weight loss to help control his blood pressure with the goal of decreasing his risk of heart attack and stroke. Daniel [name] blood pressure is not currently controlled.  At risk for cardiovascular disease Daniel [name] is at a higher than average risk for cardiovascular disease due to obesity and hypertension. He currently denies any chest pain.  Vitamin D deficiency Daniel [name] has a diagnosis of vitamin D deficiency. He is currently taking vit D and denies nausea, vomiting or muscle weakness.  Depression with emotional eating behaviors Daniel [name] started Wellbutrin and feels his mood is greatly improved. He notices improved focus and energy. He denies insomnia and his blood pressure is elevated due to running out of medications. Daniel [name] struggles with emotional eating and using food for comfort to the extent that it is negatively impacting his health. He often snacks when he is not hungry Daniel [name] sometimes feels he is out of control and then feels guilty that he made poor food  choices. He has been working on behavior modification techniques to help reduce his emotional eating and has been somewhat successful. He shows no sign of suicidal or homicidal ideations.  [facility]  Decreased Interest 0  Down, Depressed, Hopeless 0  PHQ - 2 Score 0  Altered sleeping 0  Tired, decreased energy 1  Change in appetite 0  Feeling bad or failure about yourself  0  Trouble concentrating 1  Moving slowly or fidgety/restless 0  Suicidal thoughts 0  PHQ-9 Score 2      ALLERGIES: Allergies  Allergen Reactions  . Bee Venom Anaphylaxis  . Shellfish Allergy Anaphylaxis    MEDICATIONS: Current Outpatient Medications on File Prior to Visit  Medication Sig Dispense Refill  . albuterol (VENTOLIN HFA) 108 (90 Base) MCG/ACT inhaler INHALE 2 PUFFS INTO THE LUNGS EVERY 4 HOURS AS NEEDED FOR WHEEZING OR SHORTNESS OF BREATH. 18 g 5  . buPROPion (WELLBUTRIN SR) 150 MG 12 hr tablet Take 1 tablet (150 mg total) daily by mouth. 30 tablet 0  . diltiazem (CARDIZEM LA) 240 MG 24 hr tablet Take 1 tablet (240 mg total) by mouth daily. 30 tablet 0  . EPINEPHrine (EPIPEN 2-PAK) 0.3 mg/0.3 mL SOAJ injection Inject 0.3 mLs (0.3 mg total) into the muscle once. 1 Device 11  . levocetirizine (XYZAL) 5 MG tablet Take 1 tablet (5 mg total) by mouth every evening. 90 tablet 3  . metFORMIN (GLUCOPHAGE) 500 MG tablet Take 1 tablet (500 mg total) by mouth daily  with breakfast. 30 tablet 0  . montelukast (SINGULAIR) 10 MG tablet Take 1 tablet (10 mg total) by mouth at bedtime. 90 tablet 3  . Multiple Vitamin (MULTIVITAMIN WITH MINERALS) TABS Take 1 tablet by mouth daily.    [name] omeprazole (PRILOSEC) 20 MG capsule TAKE 1 CAPSULE BY MOUTH ONCE DAILY 90 capsule 3  . Polyvinyl Alcohol-Povidone (CLEAR EYES ALL SEASONS) 5-6 MG/ML SOLN Apply 1 drop to eye once as needed. Reported on 02/01/2016    . Vitamin D, Ergocalciferol, (DRISDOL) 50000 units CAPS capsule Take 1 capsule (50,000 Units  total) every 7 (seven) days by mouth. 4 capsule 0   No current facility-administered medications on file prior to visit.     PAST MEDICAL HISTORY: Past Medical History:  Diagnosis Date  . Asthma   . GERD (gastroesophageal reflux disease)   . Hypertension     PAST SURGICAL HISTORY: No past surgical history on file.  SOCIAL HISTORY: Social History   Tobacco Use  . Smoking status: Never Smoker  . Smokeless tobacco: Never Used  Substance Use Topics  . Alcohol use: No    Alcohol/week: 0.0 oz  . Drug use: No    FAMILY HISTORY: Family History  Problem Relation Age of Onset  . Hyperlipidemia Unknown   . Hypertension Unknown   . Stroke Unknown   . Hypertension Mother   . Hyperlipidemia Mother   . Obesity Mother   . Sudden death Father     ROS: Review of Systems  Constitutional: Negative for weight loss.  Respiratory: Negative for shortness of breath (on exertion).   Cardiovascular: Negative for chest pain.  Gastrointestinal: Negative for nausea and vomiting.  Musculoskeletal:       Negative muscle weakness  Neurological: Negative for headaches.  Psychiatric/Behavioral: Positive for depression. Negative for suicidal ideas.    PHYSICAL EXAM: Blood pressure (!) 149/76, pulse 75, temperature 98 F (36.7 C), temperature source Oral, height 5\' 8"  (1.727 m), weight 294 lb (133.4 kg), SpO2 96 %. Body mass index is 44.7 kg/m. Physical Exam  RECENT LABS AND TESTS: BMET    Component Value Date/Time   NA 143 08/06/2017 1102   K 4.9 08/06/2017 1102   CL 105 08/06/2017 1102   CO2 25 08/06/2017 1102   GLUCOSE 80 08/06/2017 1102   GLUCOSE 103 (H) 11/07/2015 1454   BUN 13 08/06/2017 1102   CREATININE 1.13 08/06/2017 1102   CALCIUM 9.1 08/06/2017 1102   GFRNONAA 79 08/06/2017 1102   GFRAA 91 08/06/2017 1102   Lab Results  Component Value Date   HGBA1C 5.3 08/06/2017   HGBA1C 5.6 04/08/2017   HGBA1C 5.5 12/24/2016   Lab Results  Component Value Date   INSULIN  27.6 (H) 08/06/2017   INSULIN 28.9 (H) 04/08/2017   INSULIN 25.8 (H) 12/24/2016   CBC    Component Value Date/Time   WBC 6.6 12/24/2016 1021   WBC 10.2 11/07/2015 1454   RBC 5.47 12/24/2016 1021   RBC 5.67 11/07/2015 1454   HGB 15.2 12/24/2016 1021   HCT 44.3 12/24/2016 1021   PLT 300.0 11/07/2015 1454   MCV 81 12/24/2016 1021   MCH 27.8 12/24/2016 1021   MCH 28.0 07/18/2012 2030   MCHC 34.3 12/24/2016 1021   MCHC 33.3 11/07/2015 1454   RDW 14.6 12/24/2016 1021   LYMPHSABS 2.7 12/24/2016 1021   MONOABS 0.9 11/07/2015 1454   EOSABS 0.2 12/24/2016 1021   BASOSABS 0.1 12/24/2016 1021   Iron/TIBC/Ferritin/ %Sat No results found for: IRON, TIBC,  FERRITIN, IRONPCTSAT Lipid Panel     Component Value Date/Time   CHOL 182 08/06/2017 1102   TRIG 73 08/06/2017 1102   HDL 39 (L) 08/06/2017 1102   CHOLHDL 5 03/17/2015 0841   VLDL 22.8 03/17/2015 0841   LDLCALC 128 (H) 08/06/2017 1102   Hepatic Function Panel     Component Value Date/Time   PROT 7.5 08/06/2017 1102   ALBUMIN 4.3 08/06/2017 1102   AST 18 08/06/2017 1102   ALT 25 08/06/2017 1102   ALKPHOS 66 08/06/2017 1102   BILITOT 0.3 08/06/2017 1102   BILIDIR 0.1 11/07/2015 1454      Component Value Date/Time   TSH 1.370 12/24/2016 1021   TSH 1.39 03/17/2015 0841   TSH 1.58 03/02/2014 0911    ASSESSMENT AND PLAN: Essential hypertension - Plan: diltiazem (CARDIZEM LA) 240 MG 24 hr tablet  Vitamin D deficiency - Plan: Vitamin D, Ergocalciferol, (DRISDOL) 50000 units CAPS capsule  Other depression - with emotional eating - Plan: buPROPion (WELLBUTRIN SR) 150 MG 12 hr tablet  At risk for heart disease  Class 3 severe obesity with serious comorbidity and body mass index (BMI) of 40.0 to 44.9 in adult, unspecified obesity type (HCC)  PLAN:  Hypertension We discussed sodium restriction, working on healthy weight loss, and a regular exercise program as the means to achieve improved blood pressure control. Daniel [name]  agreed with this plan and agreed to follow up as directed. We will continue to monitor his blood pressure as well as his progress with the above lifestyle modifications. He agrees to restart Diltiazem 240 mg qd #30 with no refills and will watch for signs of hypotension as he continues his lifestyle modifications.  Cardiovascular risk counseling Daniel [name] was given extended (15 minutes) coronary artery disease prevention counseling today. He is 46 y.o. male and has risk factors for heart disease including obesity and hypertension. We discussed intensive lifestyle modifications today with an emphasis on specific weight loss instructions and strategies. Pt was also informed of the importance of increasing exercise and decreasing saturated fats to help prevent heart disease.  Vitamin D Deficiency Daniel [name] was informed that low vitamin D levels contributes to fatigue and are associated with obesity, breast, and colon cancer. He agrees to continue to take prescription Vit D @50 ,000 IU every week #4 with no refills and will follow up for routine testing of vitamin D, at least 2-3 times per year. He was informed of the risk of over-replacement of vitamin D and agrees to not increase his dose unless he discusses this with us first. Daniel [name] agrees to follow up with our clinic in 2 weeks.  Depression with Emotional Eating Behaviors We discussed behavior modification techniques today to help Daniel [name] deal with his emotional eating and depression. He has agreed to continue Wellbutrin SR 150 mg qd #30 with no refills and restart Diltiazem as soon as possible and will follow up as directed.  Obesity Daniel [name] is currently in the action stage of change. As such, his goal is to continue with weight loss efforts He has agreed to keep a food journal with 1500 to 1800 calories and 100+ grams of protein daily Daniel [name] has been instructed to work up to a goal of 150 minutes of combined cardio and strengthening exercise per week for  weight loss and overall health benefits. We discussed the following Behavioral Modification Strategies today: no skipping meals, keep a strict food journal, work on meal planning and easy cooking plans and holiday eating strategies   Daniel [name]  has agreed to follow up with our clinic in 2 weeks. He was informed of the importance of frequent follow up visits to maximize his success with intensive lifestyle modifications for his multiple health conditions.  I, [place] Crane, am acting as transcriptionist for Quillian Quince, MD  I have reviewed the above documentation for accuracy and completeness, and I agree with the above. -Quillian Quince, MD    OBESITY BEHAVIORAL INTERVENTION VISIT  Today's visit was # 18 out of 22.  Starting weight: 309 lbs Starting date: 12/24/16 Today's weight : 294 lbs Today's date: 10/27/2017 Total lbs lost to date: 15 (Patients must lose 7 lbs in the first 6 months to continue with counseling)   ASK: We discussed the diagnosis of obesity with Daniel Bond today and Ammaar agreed to give [name] permission to discuss obesity behavioral modification therapy today.  ASSESS: Kahle has the diagnosis of obesity and his BMI today is 44.71 Reda is in the action stage of change   ADVISE: Camila was educated on the multiple health risks of obesity as well as the benefit of weight loss to improve his health. He was advised of the need for long term treatment and the importance of lifestyle modifications.  AGREE: Multiple dietary modification options and treatment options were discussed and  Gustaf agreed to keep a food journal with 1500 to 1800 calories and 100+ grams of protein daily We discussed the following Behavioral Modification Strategies today: no skipping meals, keep a strict food journal, work on meal planning and easy cooking plans and holiday eating strategies

## 2017-11-11 ENCOUNTER — Encounter (INDEPENDENT_AMBULATORY_CARE_PROVIDER_SITE_OTHER): Payer: Self-pay | Admitting: Family Medicine

## 2017-11-11 ENCOUNTER — Ambulatory Visit (INDEPENDENT_AMBULATORY_CARE_PROVIDER_SITE_OTHER): Payer: 59 | Admitting: Family Medicine

## 2017-11-11 VITALS — BP 140/95 | HR 74 | Temp 97.9°F | Ht 68.0 in | Wt 292.0 lb

## 2017-11-11 DIAGNOSIS — Z9189 Other specified personal risk factors, not elsewhere classified: Secondary | ICD-10-CM | POA: Diagnosis not present

## 2017-11-11 DIAGNOSIS — E559 Vitamin D deficiency, unspecified: Secondary | ICD-10-CM | POA: Diagnosis not present

## 2017-11-11 DIAGNOSIS — E7849 Other hyperlipidemia: Secondary | ICD-10-CM | POA: Diagnosis not present

## 2017-11-11 DIAGNOSIS — E88819 Insulin resistance, unspecified: Secondary | ICD-10-CM

## 2017-11-11 DIAGNOSIS — Z6841 Body Mass Index (BMI) 40.0 and over, adult: Secondary | ICD-10-CM | POA: Diagnosis not present

## 2017-11-11 DIAGNOSIS — E8881 Metabolic syndrome: Secondary | ICD-10-CM

## 2017-11-11 MED ORDER — VITAMIN D (ERGOCALCIFEROL) 1.25 MG (50000 UNIT) PO CAPS: 50000.0000 [IU] | ORAL_CAPSULE | ORAL | 0 refills | Status: DC

## 2017-11-11 NOTE — Progress Notes (Signed)
Office: [phone]  /  Fax: [phone]   HPI:   Chief Complaint: OBESITY Daniel [name] is here to discuss his progress with his obesity treatment plan. He is on the keep a food journal with 1500-1800 calories and 100+ grams of protein daily and is following his eating plan approximately 80 % of the time. He states he is doing resistance and cardio for 45 minutes 7 times per week. Daniel [name] gained a few pounds last visit but he is doing better with meal prepping and increasing his protein. He is exercising daily, energy is good, no pain issues, and his hunger is controlled.  His weight is 292 lb (132.5 kg) today and has had a weight loss of 2 pounds over a period of 2 weeks since his last visit. He has lost 17 lbs since starting treatment with [name].  Vitamin D deficiency Daniel [name] has a diagnosis of vitamin D deficiency. He is on prescription Vit D, but not yet at goal. He notes fatigue has improved and he denies nausea, vomiting or muscle weakness.  Hyperlipidemia Daniel [name] has hyperlipidemia and has been attempting to diet control to improve his cholesterol levels with intensive lifestyle modification including a low saturated fat diet, exercise and weight loss. He is due for labs and he denies any chest pain, claudication or myalgias.  Insulin Resistance Daniel [name] has a diagnosis of insulin resistance based on his elevated fasting insulin level >5. Although Daniel [name]'s blood glucose readings are still under good control, insulin resistance puts him at greater risk of metabolic syndrome and diabetes. He is on metformin and he is working on diet prescription and exercise to decrease risk of diabetes. He is due for labs and he denies nausea, vomiting, or hypoglycemia.  At risk for diabetes Daniel [name] is at higher than average risk for developing diabetes due to his obesity and insulin resistance. He currently denies polyuria or polydipsia.  ALLERGIES: Allergies  Allergen Reactions  . Bee Venom Anaphylaxis  .  Shellfish Allergy Anaphylaxis    MEDICATIONS: Current Outpatient Medications on File Prior to Visit  Medication Sig Dispense Refill  . albuterol (VENTOLIN HFA) 108 (90 Base) MCG/ACT inhaler INHALE 2 PUFFS INTO THE LUNGS EVERY 4 HOURS AS NEEDED FOR WHEEZING OR SHORTNESS OF BREATH. 18 g 5  . buPROPion (WELLBUTRIN SR) 150 MG 12 hr tablet Take 1 tablet (150 mg total) by mouth daily. 30 tablet 0  . diltiazem (CARDIZEM LA) 240 MG 24 hr tablet Take 1 tablet (240 mg total) by mouth daily. 30 tablet 0  . EPINEPHrine (EPIPEN 2-PAK) 0.3 mg/0.3 mL SOAJ injection Inject 0.3 mLs (0.3 mg total) into the muscle once. 1 Device 11  . levocetirizine (XYZAL) 5 MG tablet Take 1 tablet (5 mg total) by mouth every evening. 90 tablet 3  . metFORMIN (GLUCOPHAGE) 500 MG tablet Take 1 tablet (500 mg total) by mouth daily with breakfast. 30 tablet 0  . montelukast (SINGULAIR) 10 MG tablet Take 1 tablet (10 mg total) by mouth at bedtime. 90 tablet 3  . Multiple Vitamin (MULTIVITAMIN WITH MINERALS) TABS Take 1 tablet by mouth daily.    [name] omeprazole (PRILOSEC) 20 MG capsule TAKE 1 CAPSULE BY MOUTH ONCE DAILY 90 capsule 3  . Polyvinyl Alcohol-Povidone (CLEAR EYES ALL SEASONS) 5-6 MG/ML SOLN Apply 1 drop to eye once as needed. Reported on 02/01/2016    . Vitamin D, Ergocalciferol, (DRISDOL) 50000 units CAPS capsule Take 1 capsule (50,000 Units total) by mouth every 7 (seven) days. 4 capsule 0   No  current facility-administered medications on file prior to visit.     PAST MEDICAL HISTORY: Past Medical History:  Diagnosis Date  . Asthma   . GERD (gastroesophageal reflux disease)   . Hypertension     PAST SURGICAL HISTORY: No past surgical history on file.  SOCIAL HISTORY: Social History   Tobacco Use  . Smoking status: Never Smoker  . Smokeless tobacco: Never Used  Substance Use Topics  . Alcohol use: No    Alcohol/week: 0.0 oz  . Drug use: No    FAMILY HISTORY: Family History  Problem Relation Age of Onset   . Hyperlipidemia Unknown   . Hypertension Unknown   . Stroke Unknown   . Hypertension Mother   . Hyperlipidemia Mother   . Obesity Mother   . Sudden death Father     ROS: Review of Systems  Constitutional: Positive for malaise/fatigue and weight loss.  Cardiovascular: Negative for chest pain and claudication.  Gastrointestinal: Negative for nausea and vomiting.  Genitourinary: Negative for frequency.  Musculoskeletal: Negative for myalgias.       Negative muscle weakness  Endo/Heme/Allergies: Negative for polydipsia.       Negative hypoglycemia    PHYSICAL EXAM: Blood pressure (!) 140/95, pulse 74, temperature 97.9 F (36.6 C), temperature source Oral, height 5\' 8"  (1.727 m), weight 292 lb (132.5 kg), SpO2 98 %. Body mass index is 44.4 kg/m. Physical Exam  Constitutional: He is oriented to person, place, and time. He appears well-developed and well-nourished.  Cardiovascular: Normal rate.  Pulmonary/Chest: Effort normal.  Musculoskeletal: Normal range of motion.  Neurological: He is oriented to person, place, and time.  Skin: Skin is warm and dry.  Psychiatric: He has a normal mood and affect. His behavior is normal.  Vitals reviewed.   RECENT LABS AND TESTS: BMET    Component Value Date/Time   NA 143 08/06/2017 1102   K 4.9 08/06/2017 1102   CL 105 08/06/2017 1102   CO2 25 08/06/2017 1102   GLUCOSE 80 08/06/2017 1102   GLUCOSE 103 (H) 11/07/2015 1454   BUN 13 08/06/2017 1102   CREATININE 1.13 08/06/2017 1102   CALCIUM 9.1 08/06/2017 1102   GFRNONAA 79 08/06/2017 1102   GFRAA 91 08/06/2017 1102   Lab Results  Component Value Date   HGBA1C 5.3 08/06/2017   HGBA1C 5.6 04/08/2017   HGBA1C 5.5 12/24/2016   Lab Results  Component Value Date   INSULIN 27.6 (H) 08/06/2017   INSULIN 28.9 (H) 04/08/2017   INSULIN 25.8 (H) 12/24/2016   CBC    Component Value Date/Time   WBC 6.6 12/24/2016 1021   WBC 10.2 11/07/2015 1454   RBC 5.47 12/24/2016 1021   RBC  5.67 11/07/2015 1454   HGB 15.2 12/24/2016 1021   HCT 44.3 12/24/2016 1021   PLT 300.0 11/07/2015 1454   MCV 81 12/24/2016 1021   MCH 27.8 12/24/2016 1021   MCH 28.0 07/18/2012 2030   MCHC 34.3 12/24/2016 1021   MCHC 33.3 11/07/2015 1454   RDW 14.6 12/24/2016 1021   LYMPHSABS 2.7 12/24/2016 1021   MONOABS 0.9 11/07/2015 1454   EOSABS 0.2 12/24/2016 1021   BASOSABS 0.1 12/24/2016 1021   Iron/TIBC/Ferritin/ %Sat No results found for: IRON, TIBC, FERRITIN, IRONPCTSAT Lipid Panel     Component Value Date/Time   CHOL 182 08/06/2017 1102   TRIG 73 08/06/2017 1102   HDL 39 (L) 08/06/2017 1102   CHOLHDL 5 03/17/2015 0841   VLDL 22.8 03/17/2015 0841   LDLCALC 128 (  H) 08/06/2017 1102   Hepatic Function Panel     Component Value Date/Time   PROT 7.5 08/06/2017 1102   ALBUMIN 4.3 08/06/2017 1102   AST 18 08/06/2017 1102   ALT 25 08/06/2017 1102   ALKPHOS 66 08/06/2017 1102   BILITOT 0.3 08/06/2017 1102   BILIDIR 0.1 11/07/2015 1454      Component Value Date/Time   TSH 1.370 12/24/2016 1021   TSH 1.39 03/17/2015 0841   TSH 1.58 03/02/2014 0911    ASSESSMENT AND PLAN: Vitamin D deficiency - Plan: VITAMIN D 25 Hydroxy (Vit-D Deficiency, Fractures), Vitamin D, Ergocalciferol, (DRISDOL) 50000 units CAPS capsule  Other hyperlipidemia  Insulin resistance - Plan: Comprehensive metabolic panel, Hemoglobin A1c, Insulin, random  At risk for diabetes mellitus  Class 3 severe obesity with serious comorbidity and body mass index (BMI) of 40.0 to 44.9 in adult, unspecified obesity type (HCC)  PLAN:  Vitamin D Deficiency Daniel [name] was informed that low vitamin D levels contributes to fatigue and are associated with obesity, breast, and colon cancer. Daniel [name] agrees to continue taking prescription Vit D @50 ,000 IU every week #4 and we will refill for 1 month. He will follow up for routine testing of vitamin D, at least 2-3 times per year. He was informed of the risk of over-replacement of  vitamin D and agrees to not increase his dose unless he discusses this with us first. Daniel [name] agrees to follow up with our clinic in 3 weeks.  Hyperlipidemia Daniel [name] was informed of the American Heart Association Guidelines emphasizing intensive lifestyle modifications as the first line treatment for hyperlipidemia. We discussed many lifestyle modifications today in depth, and Daniel [name] will continue to work on decreasing saturated fats such as fatty red meat, butter and many fried foods. He will also increase vegetables and lean protein in his diet and continue to work on exercise and weight loss efforts. We will check labs and Daniel [name] agrees to follow up with our clinic in 3 weeks.  Insulin Resistance Daniel [name] will continue to work on weight loss, exercise, and decreasing simple carbohydrates in his diet to help decrease the risk of diabetes. We dicussed metformin including benefits and risks. He was informed that eating too many simple carbohydrates or too many calories at one sitting increases the likelihood of GI side effects. Daniel [name] agrees to continue taking metformin as prescribed. We will check labs and Daniel [name] agrees to follow up with our clinic in 3 weeks as directed to monitor his progress.  Diabetes risk counselling Daniel [name] was given extended (15 minutes) diabetes prevention counseling today. He is 46 y.o. male and has risk factors for diabetes including obesity and insulin resistance. We discussed intensive lifestyle modifications today with an emphasis on weight loss as well as increasing exercise and decreasing simple carbohydrates in his diet.  Obesity Daniel [name] is currently in the action stage of change. As such, his goal is to continue with weight loss efforts He has agreed to keep a food journal with 1500-1800 calories and 100+ grams of protein daily Daniel [name] has been instructed to work up to a goal of 150 minutes of combined cardio and strengthening exercise per week for weight loss and overall  health benefits. We discussed the following Behavioral Modification Strategies today: increasing lean protein intake, dealing with family or coworker sabotage and holiday eating strategies    Daniel [name] has agreed to follow up with our clinic in 3 weeks. He was informed of the importance of frequent follow up visits to maximize his success with  intensive lifestyle modifications for his multiple health conditions.  I, Burt KnackSharon Martin, am acting as transcriptionist for Quillian Quincearen Orva Riles, MD  I have reviewed the above documentation for accuracy and completeness, and I agree with the above. -Quillian Quincearen Nariya Neumeyer, MD     Today's visit was # 19 out of 22.  Starting weight: 309 lbs Starting date: 12/24/16 Today's weight : 292 lbs  Today's date: 11/11/2017 Total lbs lost to date: 17 (Patients must lose 7 lbs in the first 6 months to continue with counseling)   ASK: We discussed the diagnosis of obesity with Marchia Bondracey M Rue today and Daniel [name] agreed to give us permission to discuss obesity behavioral modification therapy today.  ASSESS: Daniel [name] has the diagnosis of obesity and his BMI today is 44.41 Daniel [name] is in the action stage of change   ADVISE: Daniel [name] was educated on the multiple health risks of obesity as well as the benefit of weight loss to improve his health. He was advised of the need for long term treatment and the importance of lifestyle modifications.  AGREE: Multiple dietary modification options and treatment options were discussed and  Jaceion agreed to keep a food journal with 1500-1800 calories and 100+ grams of protein daily We discussed the following Behavioral Modification Strategies today: increasing lean protein intake, dealing with family or coworker sabotage and holiday eating strategies

## 2017-11-12 LAB — HEMOGLOBIN A1C
Est. average glucose Bld gHb Est-mCnc: 108 mg/dL
Hgb A1c MFr Bld: 5.4 % (ref 4.8–5.6)

## 2017-11-12 LAB — COMPREHENSIVE METABOLIC PANEL
ALBUMIN: 4.3 g/dL (ref 3.5–5.5)
ALT: 26 IU/L (ref 0–44)
AST: 16 IU/L (ref 0–40)
Albumin/Globulin Ratio: 1.3 (ref 1.2–2.2)
Alkaline Phosphatase: 62 IU/L (ref 39–117)
BUN / CREAT RATIO: 13 (ref 9–20)
BUN: 16 mg/dL (ref 6–24)
Bilirubin Total: 0.3 mg/dL (ref 0.0–1.2)
CALCIUM: 9.4 mg/dL (ref 8.7–10.2)
CHLORIDE: 104 mmol/L (ref 96–106)
CO2: 24 mmol/L (ref 20–29)
CREATININE: 1.28 mg/dL — AB (ref 0.76–1.27)
GFR, EST AFRICAN AMERICAN: 78 mL/min/{1.73_m2} (ref >59)
GFR, EST NON AFRICAN AMERICAN: 68 mL/min/{1.73_m2} (ref >59)
GLUCOSE: 80 mg/dL (ref 65–99)
Globulin, Total: 3.3 g/dL (ref 1.5–4.5)
Potassium: 4.5 mmol/L (ref 3.5–5.2)
Sodium: 140 mmol/L (ref 134–144)
TOTAL PROTEIN: 7.6 g/dL (ref 6.0–8.5)

## 2017-11-12 LAB — VITAMIN D 25 HYDROXY (VIT D DEFICIENCY, FRACTURES): VIT D 25 HYDROXY: 43.4 ng/mL (ref 30.0–100.0)

## 2017-11-12 LAB — INSULIN, RANDOM: INSULIN: 23.2 u[IU]/mL (ref 2.6–24.9)

## 2017-11-13 ENCOUNTER — Ambulatory Visit (INDEPENDENT_AMBULATORY_CARE_PROVIDER_SITE_OTHER): Payer: 59 | Admitting: Family Medicine

## 2017-11-14 MED FILL — MONTELUKAST SOD 10 MG TAB: 10 | 90 days supply | Qty: 90 | Fill #3

## 2017-12-02 ENCOUNTER — Ambulatory Visit (INDEPENDENT_AMBULATORY_CARE_PROVIDER_SITE_OTHER): Payer: 59 | Admitting: Family Medicine

## 2017-12-02 VITALS — BP 149/79 | HR 68 | Temp 98.2°F | Ht 68.0 in | Wt 291.0 lb

## 2017-12-02 DIAGNOSIS — Z9189 Other specified personal risk factors, not elsewhere classified: Secondary | ICD-10-CM

## 2017-12-02 DIAGNOSIS — I10 Essential (primary) hypertension: Secondary | ICD-10-CM

## 2017-12-02 DIAGNOSIS — Z6841 Body Mass Index (BMI) 40.0 and over, adult: Secondary | ICD-10-CM | POA: Diagnosis not present

## 2017-12-02 DIAGNOSIS — E8881 Metabolic syndrome: Secondary | ICD-10-CM | POA: Diagnosis not present

## 2017-12-02 MED ORDER — DILTIAZEM HCL ER COATED BEADS 240 MG PO TB24: 240.0000 mg | ORAL_TABLET | Freq: Every day | ORAL | 0 refills | Status: DC

## 2017-12-02 NOTE — Progress Notes (Signed)
Office: [phone]  /  Fax: [phone]   HPI:   Chief Complaint: OBESITY Daniel [name] is here to discuss his progress with his obesity treatment plan. He is on the keep a food journal with 1500 to 1800 calories and 100+ grams of protein daily and is following his eating plan approximately 85 % of the time. He states he is doing cardio for 30 minutes and weight lifting for 30 minutes 5 times per week. Daniel [name] did well not gaining weight over Christmas. He tried to decrease liquid calories and increase lean protein intake, but he is struggling with planning meals ahead of time. His weight is 291 lb (132 kg) today and has had a weight loss of 1 pound over a period of 3 weeks since his last visit. He has lost 18 lbs since starting treatment with [name].  Insulin Resistance Daniel [name] has a diagnosis of insulin resistance based on his elevated fasting insulin level >5. Although Daniel [name]'s blood glucose readings are still under good control, insulin resistance puts him at greater risk of metabolic syndrome and diabetes. He is on metformin but feels it gives him a headache and makes him feel tired. Daniel [name] continues to work on diet and exercise to decrease risk of diabetes.  At risk for diabetes Daniel [name] is at higher than average risk for developing diabetes due to his obesity and insulin resistance. He currently denies polyuria or polydipsia.  Hypertension Daniel [name] is a 47 y.o. male with hypertension. His blood pressure is borderline elevated again today. His blood pressure is slowly increasing with some holiday food and mild weight gain. He previously controlled with diet and medications. Daniel [name] denies chest pain or shortness of breath on exertion. He is working weight loss to help control his blood pressure with the goal of decreasing his risk of heart attack and stroke. [name] blood pressure is not currently controlled.  ALLERGIES: Allergies  Allergen Reactions  . Bee Venom Anaphylaxis  .  Shellfish Allergy Anaphylaxis    MEDICATIONS: Current Outpatient Medications on File Prior to Visit  Medication Sig Dispense Refill  . albuterol (VENTOLIN HFA) 108 (90 Base) MCG/ACT inhaler INHALE 2 PUFFS INTO THE LUNGS EVERY 4 HOURS AS NEEDED FOR WHEEZING OR SHORTNESS OF BREATH. 18 g 5  . buPROPion (WELLBUTRIN SR) 150 MG 12 hr tablet Take 1 tablet (150 mg total) by mouth daily. 30 tablet 0  . diltiazem (CARDIZEM LA) 240 MG 24 hr tablet Take 1 tablet (240 mg total) by mouth daily. 30 tablet 0  . EPINEPHrine (EPIPEN 2-PAK) 0.3 mg/0.3 mL SOAJ injection Inject 0.3 mLs (0.3 mg total) into the muscle once. 1 Device 11  . levocetirizine (XYZAL) 5 MG tablet Take 1 tablet (5 mg total) by mouth every evening. 90 tablet 3  . montelukast (SINGULAIR) 10 MG tablet Take 1 tablet (10 mg total) by mouth at bedtime. 90 tablet 3  . Multiple Vitamin (MULTIVITAMIN WITH MINERALS) TABS Take 1 tablet by mouth daily.    [name] omeprazole (PRILOSEC) 20 MG capsule TAKE 1 CAPSULE BY MOUTH ONCE DAILY 90 capsule 3  . Polyvinyl Alcohol-Povidone (CLEAR EYES ALL SEASONS) 5-6 MG/ML SOLN Apply 1 drop to eye once as needed. Reported on 02/01/2016    . Vitamin D, Ergocalciferol, (DRISDOL) 50000 units CAPS capsule Take 1 capsule (50,000 Units total) by mouth every 7 (seven) days. 4 capsule 0   No current facility-administered medications on file prior to visit.     PAST MEDICAL HISTORY: Past Medical History:  Diagnosis  Date  . Asthma   . GERD (gastroesophageal reflux disease)   . Hypertension     PAST SURGICAL HISTORY: No past surgical history on file.  SOCIAL HISTORY: Social History   Tobacco Use  . Smoking status: Never Smoker  . Smokeless tobacco: Never Used  Substance Use Topics  . Alcohol use: No    Alcohol/week: 0.0 oz  . Drug use: No    FAMILY HISTORY: Family History  Problem Relation Age of Onset  . Hyperlipidemia Unknown   . Hypertension Unknown   . Stroke Unknown   . Hypertension Mother   .  Hyperlipidemia Mother   . Obesity Mother   . Sudden death Father     ROS: Review of Systems  Constitutional: Positive for malaise/fatigue.  Respiratory: Negative for shortness of breath (on exertion).   Cardiovascular: Negative for chest pain.  Genitourinary: Negative for frequency.  Neurological: Positive for headaches.  Endo/Heme/Allergies: Negative for polydipsia.    PHYSICAL EXAM: Blood pressure (!) 149/79, pulse 68, temperature 98.2 F (36.8 C), temperature source Oral, height 5\' 8"  (1.727 m), weight 291 lb (132 kg), SpO2 95 %. Body mass index is 44.25 kg/m. Physical Exam  Constitutional: He is oriented to person, place, and time. He appears well-developed and well-nourished.  Cardiovascular: Normal rate.  Pulmonary/Chest: Effort normal.  Musculoskeletal: Normal range of motion.  Neurological: He is oriented to person, place, and time.  Skin: Skin is warm and dry.  Psychiatric: He has a normal mood and affect. His behavior is normal.  Vitals reviewed.   RECENT LABS AND TESTS: BMET    Component Value Date/Time   NA 140 11/11/2017 1008   K 4.5 11/11/2017 1008   CL 104 11/11/2017 1008   CO2 24 11/11/2017 1008   GLUCOSE 80 11/11/2017 1008   GLUCOSE 103 (H) 11/07/2015 1454   BUN 16 11/11/2017 1008   CREATININE 1.28 (H) 11/11/2017 1008   CALCIUM 9.4 11/11/2017 1008   GFRNONAA 68 11/11/2017 1008   GFRAA 78 11/11/2017 1008   Lab Results  Component Value Date   HGBA1C 5.4 11/11/2017   HGBA1C 5.3 08/06/2017   HGBA1C 5.6 04/08/2017   HGBA1C 5.5 12/24/2016   Lab Results  Component Value Date   INSULIN 23.2 11/11/2017   INSULIN 27.6 (H) 08/06/2017   INSULIN 28.9 (H) 04/08/2017   INSULIN 25.8 (H) 12/24/2016   CBC    Component Value Date/Time   WBC 6.6 12/24/2016 1021   WBC 10.2 11/07/2015 1454   RBC 5.47 12/24/2016 1021   RBC 5.67 11/07/2015 1454   HGB 15.2 12/24/2016 1021   HCT 44.3 12/24/2016 1021   PLT 300.0 11/07/2015 1454   MCV 81 12/24/2016 1021    MCH 27.8 12/24/2016 1021   MCH 28.0 07/18/2012 2030   MCHC 34.3 12/24/2016 1021   MCHC 33.3 11/07/2015 1454   RDW 14.6 12/24/2016 1021   LYMPHSABS 2.7 12/24/2016 1021   MONOABS 0.9 11/07/2015 1454   EOSABS 0.2 12/24/2016 1021   BASOSABS 0.1 12/24/2016 1021   Iron/TIBC/Ferritin/ %Sat No results found for: IRON, TIBC, FERRITIN, IRONPCTSAT Lipid Panel     Component Value Date/Time   CHOL 182 08/06/2017 1102   TRIG 73 08/06/2017 1102   HDL 39 (L) 08/06/2017 1102   CHOLHDL 5 03/17/2015 0841   VLDL 22.8 03/17/2015 0841   LDLCALC 128 (H) 08/06/2017 1102   Hepatic Function Panel     Component Value Date/Time   PROT 7.6 11/11/2017 1008   ALBUMIN 4.3 11/11/2017 1008  AST 16 11/11/2017 1008   ALT 26 11/11/2017 1008   ALKPHOS 62 11/11/2017 1008   BILITOT 0.3 11/11/2017 1008   BILIDIR 0.1 11/07/2015 1454      Component Value Date/Time   TSH 1.370 12/24/2016 1021   TSH 1.39 03/17/2015 0841   TSH 1.58 03/02/2014 0911    ASSESSMENT AND PLAN: Insulin resistance  Essential hypertension - Plan: diltiazem (CARDIZEM LA) 240 MG 24 hr tablet  At risk for diabetes mellitus  Class 3 severe obesity with serious comorbidity and body mass index (BMI) of 40.0 to 44.9 in adult, unspecified obesity type (HCC)  PLAN:  Insulin Resistance Daniel [name] will continue to work on weight loss, exercise, and decreasing simple carbohydrates in his diet to help decrease the risk of diabetes. We dicussed metformin including benefits and risks. He was informed that eating too many simple carbohydrates or too many calories at one sitting increases the likelihood of GI side effects. Daniel [name] agrees to discontinue metformin. We will check labs and Daniel [name] agreed to follow up with us as directed to monitor his progress.  Diabetes risk counseling Daniel [name] was given extended (15 minutes) diabetes prevention counseling today. He is 47 y.o. male and has risk factors for diabetes including obesity and insulin resistance. We  discussed intensive lifestyle modifications today with an emphasis on weight loss as well as increasing exercise and decreasing simple carbohydrates in his diet.  Hypertension We discussed sodium restriction, working on healthy weight loss, and a regular exercise program as the means to achieve improved blood pressure control. Niklas agreed with this plan and agreed to follow up as directed. We will continue to monitor his blood pressure as well as his progress with the above lifestyle modifications. He agrees to continue diltiazem 240 mg qd #30 with no refills and will watch for signs of hypotension as he continues his lifestyle modifications. Daniel [name] agrees to change diet prescription and get back to weight loss.  Obesity Daniel [name] is currently in the action stage of change. As such, his goal is to continue with weight loss efforts He has agreed to change to a lower carbohydrate, vegetable and lean protein rich diet plan Daniel [name] has been instructed to work up to a goal of 150 minutes of combined cardio and strengthening exercise per week for weight loss and overall health benefits. We discussed the following Behavioral Modification Strategies today: increase H2O intake, increasing lean protein intake, increasing vegetables and work on meal planning and easy cooking plans  Daniel [name] has agreed to follow up with our clinic in 2 weeks. He was informed of the importance of frequent follow up visits to maximize his success with intensive lifestyle modifications for his multiple health conditions.   OBESITY BEHAVIORAL INTERVENTION VISIT  Today's visit was # 20 out of 22.  Starting weight: 309 lbs Starting date: 12/24/16 Today's weight : 291 lbs Today's date: 12/02/2017 Total lbs lost to date: 3018 (Patients must lose 7 lbs in the first 6 months to continue with counseling)   ASK: We discussed the diagnosis of obesity with Marchia Bondracey M Markos today and Daniel [name] agreed to give us permission to discuss obesity  behavioral modification therapy today.  ASSESS: Daniel [name] has the diagnosis of obesity and his BMI today is 44.26 Daniel [name] is in the action stage of change   ADVISE: Daniel [name] was educated on the multiple health risks of obesity as well as the benefit of weight loss to improve his health. He was advised of the need for long term treatment and the  importance of lifestyle modifications.  AGREE: Multiple dietary modification options and treatment options were discussed and  Tarvaris agreed to the above obesity treatment plan.  I, [place] Crane, am acting as transcriptionist for Quillian Quince, MD  I have reviewed the above documentation for accuracy and completeness, and I agree with the above. -Quillian Quince, MD

## 2017-12-03 ENCOUNTER — Other Ambulatory Visit (INDEPENDENT_AMBULATORY_CARE_PROVIDER_SITE_OTHER): Payer: Self-pay | Admitting: Family Medicine

## 2017-12-03 DIAGNOSIS — F3289 Other specified depressive episodes: Secondary | ICD-10-CM

## 2017-12-03 MED FILL — DILTIAZEM ER 240 MG TABLET: 240 | 30 days supply | Qty: 30 | Fill #0

## 2017-12-03 MED FILL — VIT D2 1.25 MG (50,000 UNIT: 1.25 MG | 28 days supply | Qty: 4 | Fill #0

## 2017-12-16 ENCOUNTER — Ambulatory Visit (INDEPENDENT_AMBULATORY_CARE_PROVIDER_SITE_OTHER): Payer: 59 | Admitting: Physician Assistant

## 2017-12-16 ENCOUNTER — Encounter (INDEPENDENT_AMBULATORY_CARE_PROVIDER_SITE_OTHER): Payer: Self-pay | Admitting: Physician Assistant

## 2017-12-16 VITALS — BP 146/85 | HR 85 | Temp 98.0°F | Ht 68.0 in | Wt 286.0 lb

## 2017-12-16 DIAGNOSIS — F3289 Other specified depressive episodes: Secondary | ICD-10-CM

## 2017-12-16 DIAGNOSIS — E7849 Other hyperlipidemia: Secondary | ICD-10-CM | POA: Diagnosis not present

## 2017-12-16 DIAGNOSIS — I10 Essential (primary) hypertension: Secondary | ICD-10-CM

## 2017-12-16 DIAGNOSIS — Z6841 Body Mass Index (BMI) 40.0 and over, adult: Secondary | ICD-10-CM | POA: Diagnosis not present

## 2017-12-16 DIAGNOSIS — Z9189 Other specified personal risk factors, not elsewhere classified: Secondary | ICD-10-CM

## 2017-12-16 NOTE — Progress Notes (Addendum)
Office: [phone]  /  Fax: [phone]   HPI:   Chief Complaint: OBESITY Daniel [name] is here to discuss his progress with his obesity treatment plan. He is on the lower carbohydrate, vegetable and lean protein rich diet plan and is following his eating plan approximately 80 % of the time. He states he is doing cardio and weights for 45 minutes 5 times per week. Daniel [name] tolerated the low carbohydrate meal plan well. He would like to have more variety with his meals.  His weight is 286 lb (129.7 kg) today and has had a weight loss of 5 pounds over a period of 2 weeks since his last visit. He has lost 23 lbs since starting treatment with us.  Hyperlipidemia Daniel [name] has hyperlipidemia and has been trying to improve his cholesterol levels with intensive lifestyle modification including a low saturated fat diet, exercise and weight loss. LDL >100 and he denies any chest pain, claudication or myalgias. Daniel [name] is not on statin and he declines today.  Hypertension Daniel [name] is a 47 y.o. male with hypertension. [name]'s blood pressure is elevated. He states blood pressure during the day when he checks it at work or home is stable. He declines any adjustment to his blood pressure medication today. He denies chest pain or shortness of breath. He is working weight loss to help control his blood pressure with the goal of decreasing his risk of heart attack and stroke. [name]'s blood pressure is not currently controlled.  At risk for cardiovascular disease Daniel [name] is at a higher than average risk for cardiovascular disease due to obesity. He currently denies any chest pain.  Depression with emotional eating behaviors Daniel [name] is struggling with emotional eating and using food for comfort to the extent that it is negatively impacting his health. He often snacks when he is not hungry. Daniel [name] sometimes feels he is out of control and then feels guilty that he made poor food choices. He has been working on  behavior modification techniques to help reduce his emotional eating and has been somewhat successful. He shows no sign of suicidal or homicidal ideations.  [facility]  Decreased Interest 0  Down, Depressed, Hopeless 0  PHQ - 2 Score 0  Altered sleeping 0  Tired, decreased energy 1  Change in appetite 0  Feeling bad or failure about yourself  0  Trouble concentrating 1  Moving slowly or fidgety/restless 0  Suicidal thoughts 0  PHQ-9 Score 2       ALLERGIES: Allergies  Allergen Reactions  . Bee Venom Anaphylaxis  . Shellfish Allergy Anaphylaxis    MEDICATIONS: Current Outpatient Medications on File Prior to Visit  Medication Sig Dispense Refill  . albuterol (VENTOLIN HFA) 108 (90 Base) MCG/ACT inhaler INHALE 2 PUFFS INTO THE LUNGS EVERY 4 HOURS AS NEEDED FOR WHEEZING OR SHORTNESS OF BREATH. 18 g 5  . buPROPion (WELLBUTRIN SR) 150 MG 12 hr tablet Take 1 tablet (150 mg total) by mouth daily. 30 tablet 0  . diltiazem (CARDIZEM LA) 240 MG 24 hr tablet Take 1 tablet (240 mg total) by mouth daily. 30 tablet 0  . EPINEPHrine (EPIPEN 2-PAK) 0.3 mg/0.3 mL SOAJ injection Inject 0.3 mLs (0.3 mg total) into the muscle once. 1 Device 11  . levocetirizine (XYZAL) 5 MG tablet Take 1 tablet (5 mg total) by mouth every evening. 90 tablet 3  . montelukast (SINGULAIR) 10 MG tablet Take 1 tablet (10 mg total) by mouth at bedtime. 90 tablet 3  .  Multiple Vitamin (MULTIVITAMIN WITH MINERALS) TABS Take 1 tablet by mouth daily.    [name] omeprazole (PRILOSEC) 20 MG capsule TAKE 1 CAPSULE BY MOUTH ONCE DAILY 90 capsule 3  . Polyvinyl Alcohol-Povidone (CLEAR EYES ALL SEASONS) 5-6 MG/ML SOLN Apply 1 drop to eye once as needed. Reported on 02/01/2016    . Vitamin D, Ergocalciferol, (DRISDOL) 50000 units CAPS capsule Take 1 capsule (50,000 Units total) by mouth every 7 (seven) days. 4 capsule 0   No current facility-administered medications on file prior to visit.     PAST MEDICAL  HISTORY: Past Medical History:  Diagnosis Date  . Asthma   . GERD (gastroesophageal reflux disease)   . Hypertension     PAST SURGICAL HISTORY: No past surgical history on file.  SOCIAL HISTORY: Social History   Tobacco Use  . Smoking status: Never Smoker  . Smokeless tobacco: Never Used  Substance Use Topics  . Alcohol use: No    Alcohol/week: 0.0 oz  . Drug use: No    FAMILY HISTORY: Family History  Problem Relation Age of Onset  . Hyperlipidemia Unknown   . Hypertension Unknown   . Stroke Unknown   . Hypertension Mother   . Hyperlipidemia Mother   . Obesity Mother   . Sudden death Father     ROS: Review of Systems  Constitutional: Positive for weight loss.  Respiratory: Negative for shortness of breath.   Cardiovascular: Negative for chest pain and claudication.  Musculoskeletal: Negative for myalgias.    PHYSICAL EXAM: Blood pressure (!) 146/85, pulse 85, temperature 98 F (36.7 C), temperature source Oral, height 5\' 8"  (1.727 m), weight 286 lb (129.7 kg), SpO2 96 %. Body mass index is 43.49 kg/m. Physical Exam  Constitutional: He is oriented to person, place, and time. He appears well-developed and well-nourished.  Cardiovascular: Normal rate.  Pulmonary/Chest: Effort normal.  Musculoskeletal: Normal range of motion.  Neurological: He is oriented to person, place, and time.  Skin: Skin is warm and dry.  Psychiatric: He has a normal mood and affect. His behavior is normal.  Vitals reviewed.   RECENT LABS AND TESTS: BMET    Component Value Date/Time   NA 140 11/11/2017 1008   K 4.5 11/11/2017 1008   CL 104 11/11/2017 1008   CO2 24 11/11/2017 1008   GLUCOSE 80 11/11/2017 1008   GLUCOSE 103 (H) 11/07/2015 1454   BUN 16 11/11/2017 1008   CREATININE 1.28 (H) 11/11/2017 1008   CALCIUM 9.4 11/11/2017 1008   GFRNONAA 68 11/11/2017 1008   GFRAA 78 11/11/2017 1008   Lab Results  Component Value Date   HGBA1C 5.4 11/11/2017   HGBA1C 5.3  08/06/2017   HGBA1C 5.6 04/08/2017   HGBA1C 5.5 12/24/2016   Lab Results  Component Value Date   INSULIN 23.2 11/11/2017   INSULIN 27.6 (H) 08/06/2017   INSULIN 28.9 (H) 04/08/2017   INSULIN 25.8 (H) 12/24/2016   CBC    Component Value Date/Time   WBC 6.6 12/24/2016 1021   WBC 10.2 11/07/2015 1454   RBC 5.47 12/24/2016 1021   RBC 5.67 11/07/2015 1454   HGB 15.2 12/24/2016 1021   HCT 44.3 12/24/2016 1021   PLT 300.0 11/07/2015 1454   MCV 81 12/24/2016 1021   MCH 27.8 12/24/2016 1021   MCH 28.0 07/18/2012 2030   MCHC 34.3 12/24/2016 1021   MCHC 33.3 11/07/2015 1454   RDW 14.6 12/24/2016 1021   LYMPHSABS 2.7 12/24/2016 1021   MONOABS 0.9 11/07/2015 1454  EOSABS 0.2 12/24/2016 1021   BASOSABS 0.1 12/24/2016 1021   Iron/TIBC/Ferritin/ %Sat No results found for: IRON, TIBC, FERRITIN, IRONPCTSAT Lipid Panel     Component Value Date/Time   CHOL 182 08/06/2017 1102   TRIG 73 08/06/2017 1102   HDL 39 (L) 08/06/2017 1102   CHOLHDL 5 03/17/2015 0841   VLDL 22.8 03/17/2015 0841   LDLCALC 128 (H) 08/06/2017 1102   Hepatic Function Panel     Component Value Date/Time   PROT 7.6 11/11/2017 1008   ALBUMIN 4.3 11/11/2017 1008   AST 16 11/11/2017 1008   ALT 26 11/11/2017 1008   ALKPHOS 62 11/11/2017 1008   BILITOT 0.3 11/11/2017 1008   BILIDIR 0.1 11/07/2015 1454      Component Value Date/Time   TSH 1.370 12/24/2016 1021   TSH 1.39 03/17/2015 0841   TSH 1.58 03/02/2014 0911    ASSESSMENT AND PLAN: Other hyperlipidemia  Essential hypertension  Class 3 severe obesity with serious comorbidity and body mass index (BMI) of 40.0 to 44.9 in adult, unspecified obesity type (HCC)  PLAN:  Hyperlipidemia Darral was informed of the American Heart Association Guidelines emphasizing intensive lifestyle modifications as the first line treatment for hyperlipidemia. We discussed many lifestyle modifications today in depth, and Navon will continue to work on decreasing saturated  fats such as fatty red meat, butter and many fried foods. He will also increase vegetables and lean protein in his diet and continue to work on diet, exercise, and weight loss efforts.  Hypertension We discussed sodium restriction, working on healthy weight loss, and a regular exercise program as the means to achieve improved blood pressure control. Edrian agreed with this plan and agreed to follow up as directed. We will continue to monitor his blood pressure as well as his progress with the above lifestyle modifications. He will continue his medications as prescribed and will watch for signs of hypotension as he continues his lifestyle modifications. Con agrees to follow up with our clinic in 3 weeks.  Depression with Emotional Eating Behaviors We discussed behavior modification techniques today to help Walid deal with his emotional eating and depression. He has agreed to take Wellbutrin SR 150 mg qd, will refill for 1 month,  and agreed to follow up as directed.  Cardiovascular risk counselling Jhase was given extended (15 minutes) coronary artery disease prevention counseling today. He is 47 y.o. male and has risk factors for heart disease including obesity. We discussed intensive lifestyle modifications today with an emphasis on specific weight loss instructions and strategies. Pt was also informed of the importance of increasing exercise and decreasing saturated fats to help prevent heart disease.   Obesity Tayon is currently in the action stage of change. As such, his goal is to continue with weight loss efforts He has agreed to follow the Category 2 plan Tiquan has been instructed to work up to a goal of 150 minutes of combined cardio and strengthening exercise per week for weight loss and overall health benefits. We discussed the following Behavioral Modification Strategies today: increasing lean protein intake and work on meal planning and easy cooking plans   Joseff has agreed to  follow up with our clinic in 3 weeks. He was informed of the importance of frequent follow up visits to maximize his success with intensive lifestyle modifications for his multiple health conditions.   OBESITY BEHAVIORAL INTERVENTION VISIT  Today's visit was # 21 out of 22.  Starting weight: 309 lbs Starting date: 12/24/16 Today's weight :  286 lbs  Today's date: 12/16/2017 Total lbs lost to date: 23 (Patients must lose 7 lbs in the first 6 months to continue with counseling)   ASK: We discussed the diagnosis of obesity with Daniel [name] today and Petros agreed to give [name] permission to discuss obesity behavioral modification therapy today.  ASSESS: Taytum has the diagnosis of obesity and his BMI today is 43.5 Emersyn is in the action stage of change   ADVISE: Golden was educated on the multiple health risks of obesity as well as the benefit of weight loss to improve his health. He was advised of the need for long term treatment and the importance of lifestyle modifications.  AGREE: Multiple dietary modification options and treatment options were discussed and  Fields agreed to the above obesity treatment plan.   Trude Mcburney, am acting as transcriptionist for Illa Level, PA-C I, Illa Level [name], have reviewed this note and agree with its contents.  I have reviewed the above documentation for accuracy and completeness, and I agree with the above. -Quillian Quince, MD

## 2017-12-17 ENCOUNTER — Other Ambulatory Visit (INDEPENDENT_AMBULATORY_CARE_PROVIDER_SITE_OTHER): Payer: Self-pay | Admitting: Family Medicine

## 2017-12-17 DIAGNOSIS — F3289 Other specified depressive episodes: Secondary | ICD-10-CM

## 2017-12-17 MED ORDER — BUPROPION HCL ER (SR) 150 MG PO TB12: 150.0000 mg | ORAL_TABLET | Freq: Every day | ORAL | 0 refills | Status: DC

## 2017-12-17 MED FILL — BUPROPION SR 150 MG TABLET: 150 | 30 days supply | Qty: 30 | Fill #0

## 2018-01-06 ENCOUNTER — Ambulatory Visit (INDEPENDENT_AMBULATORY_CARE_PROVIDER_SITE_OTHER): Payer: 59 | Admitting: Physician Assistant

## 2018-01-06 VITALS — BP 130/73 | HR 63 | Temp 98.1°F | Ht 68.0 in | Wt 284.0 lb

## 2018-01-06 DIAGNOSIS — Z9189 Other specified personal risk factors, not elsewhere classified: Secondary | ICD-10-CM

## 2018-01-06 DIAGNOSIS — I10 Essential (primary) hypertension: Secondary | ICD-10-CM

## 2018-01-06 DIAGNOSIS — E559 Vitamin D deficiency, unspecified: Secondary | ICD-10-CM

## 2018-01-06 DIAGNOSIS — F3289 Other specified depressive episodes: Secondary | ICD-10-CM | POA: Diagnosis not present

## 2018-01-06 DIAGNOSIS — Z6841 Body Mass Index (BMI) 40.0 and over, adult: Secondary | ICD-10-CM | POA: Diagnosis not present

## 2018-01-06 DIAGNOSIS — F329 Major depressive disorder, single episode, unspecified: Secondary | ICD-10-CM | POA: Insufficient documentation

## 2018-01-06 MED ORDER — DILTIAZEM HCL ER COATED BEADS 240 MG PO TB24: 240.0000 mg | ORAL_TABLET | Freq: Every day | ORAL | 0 refills | Status: DC

## 2018-01-06 MED ORDER — BUPROPION HCL ER (SR) 150 MG PO TB12: 150.0000 mg | ORAL_TABLET | Freq: Every day | ORAL | 0 refills | Status: DC

## 2018-01-06 MED ORDER — VITAMIN D (ERGOCALCIFEROL) 1.25 MG (50000 UNIT) PO CAPS: 50000.0000 [IU] | ORAL_CAPSULE | ORAL | 0 refills | Status: DC

## 2018-01-06 MED FILL — DILTIAZEM ER 240 MG TABLET: 240 | 30 days supply | Qty: 30 | Fill #0

## 2018-01-06 MED FILL — VIT D2 1.25 MG (50,000 UNIT: 1.25 MG | 28 days supply | Qty: 4 | Fill #0

## 2018-01-06 NOTE — Progress Notes (Signed)
Office: [phone]  /  Fax: [phone]   HPI:   Chief Complaint: OBESITY Daniel [name] is here to discuss his progress with his obesity treatment plan. He is on the Category 2 plan and is following his eating plan approximately 80 % of the time. He states he is doing cardio and weights for 45 to 60 minutes 5 times per week. Daniel [name] continues to do well with weight loss. He goes out to eat, but does not journal his eating out. Daniel [name] does well exercising each morning and at lunchtime at work. His weight is 284 lb (128.8 kg) today and has had a weight loss of 2 pounds over a period of 3 weeks since his last visit. He has lost 25 lbs since starting treatment with [name].  Hypertension Daniel [name] is a 47 y.o. male with hypertension. Daniel [name] denies chest pain or shortness of breath on exertion. He is working weight loss to help control his blood pressure with the goal of decreasing his risk of heart attack and stroke. Daniel [name] blood pressure is stable.  At risk for cardiovascular disease Daniel [name] is at a higher than average risk for cardiovascular disease due to obesity and hypertension. He currently denies any chest pain.  Vitamin D deficiency Daniel [name] has a diagnosis of vitamin D deficiency. He is currently taking vit D and denies nausea, vomiting or muscle weakness.   Ref. Range 11/11/2017 10:08  Vitamin D, 25-Hydroxy Latest Ref Range: 30.0 - 100.0 ng/mL 43.4   Depression with emotional eating behaviors Daniel [name] is struggling with emotional eating and using food for comfort to the extent that it is negatively impacting his health. He often snacks when he is not hungry.Daniel [name] sometimes feels he is out of control and then feels guilty that he made poor food choices. He has been working on behavior modification techniques to help reduce his emotional eating and has been somewhat successful. His mood is stable and he shows no sign of suicidal or homicidal ideations.  Depression screen Daniel [name] 2/9  12/24/2016  Decreased Interest 0  Down, Depressed, Hopeless 0  PHQ - 2 Score 0  Altered sleeping 0  Tired, decreased energy 1  Change in appetite 0  Feeling bad or failure about yourself  0  Trouble concentrating 1  Moving slowly or fidgety/restless 0  Suicidal thoughts 0  PHQ-9 Score 2    ALLERGIES: Allergies  Allergen Reactions  . Bee Venom Anaphylaxis  . Shellfish Allergy Anaphylaxis    MEDICATIONS: Current Outpatient Medications on File Prior to Visit  Medication Sig Dispense Refill  . albuterol (VENTOLIN HFA) 108 (90 Base) MCG/ACT inhaler INHALE 2 PUFFS INTO THE LUNGS EVERY 4 HOURS AS NEEDED FOR WHEEZING OR SHORTNESS OF BREATH. 18 g 5  . buPROPion (WELLBUTRIN SR) 150 MG 12 hr tablet Take 1 tablet (150 mg total) by mouth daily. 30 tablet 0  . diltiazem (CARDIZEM LA) 240 MG 24 hr tablet Take 1 tablet (240 mg total) by mouth daily. 30 tablet 0  . EPINEPHrine (EPIPEN 2-PAK) 0.3 mg/0.3 mL SOAJ injection Inject 0.3 mLs (0.3 mg total) into the muscle once. 1 Device 11  . levocetirizine (XYZAL) 5 MG tablet Take 1 tablet (5 mg total) by mouth every evening. 90 tablet 3  . montelukast (SINGULAIR) 10 MG tablet Take 1 tablet (10 mg total) by mouth at bedtime. 90 tablet 3  . Multiple Vitamin (MULTIVITAMIN WITH MINERALS) TABS Take 1 tablet by mouth daily.    [name] omeprazole (PRILOSEC) 20 MG capsule TAKE  1 CAPSULE BY MOUTH ONCE DAILY 90 capsule 3  . Polyvinyl Alcohol-Povidone (CLEAR EYES ALL SEASONS) 5-6 MG/ML SOLN Apply 1 drop to eye once as needed. Reported on 02/01/2016    . Vitamin D, Ergocalciferol, (DRISDOL) 50000 units CAPS capsule Take 1 capsule (50,000 Units total) by mouth every 7 (seven) days. 4 capsule 0   No current facility-administered medications on file prior to visit.     PAST MEDICAL HISTORY: Past Medical History:  Diagnosis Date  . Asthma   . GERD (gastroesophageal reflux disease)   . Hypertension     PAST SURGICAL HISTORY: No past surgical history on  file.  SOCIAL HISTORY: Social History   Tobacco Use  . Smoking status: Never Smoker  . Smokeless tobacco: Never Used  Substance Use Topics  . Alcohol use: No    Alcohol/week: 0.0 oz  . Drug use: No    FAMILY HISTORY: Family History  Problem Relation Age of Onset  . Hyperlipidemia Unknown   . Hypertension Unknown   . Stroke Unknown   . Hypertension Mother   . Hyperlipidemia Mother   . Obesity Mother   . Sudden death Father     ROS: Review of Systems  Constitutional: Positive for weight loss.  Respiratory: Negative for shortness of breath (on exertion).   Cardiovascular: Negative for chest pain.  Gastrointestinal: Negative for nausea and vomiting.  Musculoskeletal:       Negative for muscle weakness  Psychiatric/Behavioral: Positive for depression. Negative for suicidal ideas.    PHYSICAL EXAM: Blood pressure 130/73, pulse 63, temperature 98.1 F (36.7 C), temperature source Oral, height 5\' 8"  (1.727 m), weight 284 lb (128.8 kg), SpO2 97 %. Body mass index is 43.18 kg/m. Physical Exam  Constitutional: He is oriented to person, place, and time. He appears well-developed and well-nourished.  Cardiovascular: Normal rate.  Pulmonary/Chest: Effort normal.  Musculoskeletal: Normal range of motion.  Neurological: He is oriented to person, place, and time.  Skin: Skin is warm and dry.  Psychiatric: He has a normal mood and affect. His behavior is normal.  Vitals reviewed.   RECENT LABS AND TESTS: BMET    Component Value Date/Time   NA 140 11/11/2017 1008   K 4.5 11/11/2017 1008   CL 104 11/11/2017 1008   CO2 24 11/11/2017 1008   GLUCOSE 80 11/11/2017 1008   GLUCOSE 103 (H) 11/07/2015 1454   BUN 16 11/11/2017 1008   CREATININE 1.28 (H) 11/11/2017 1008   CALCIUM 9.4 11/11/2017 1008   GFRNONAA 68 11/11/2017 1008   GFRAA 78 11/11/2017 1008   Lab Results  Component Value Date   HGBA1C 5.4 11/11/2017   HGBA1C 5.3 08/06/2017   HGBA1C 5.6 04/08/2017   HGBA1C  5.5 12/24/2016   Lab Results  Component Value Date   INSULIN 23.2 11/11/2017   INSULIN 27.6 (H) 08/06/2017   INSULIN 28.9 (H) 04/08/2017   INSULIN 25.8 (H) 12/24/2016   CBC    Component Value Date/Time   WBC 6.6 12/24/2016 1021   WBC 10.2 11/07/2015 1454   RBC 5.47 12/24/2016 1021   RBC 5.67 11/07/2015 1454   HGB 15.2 12/24/2016 1021   HCT 44.3 12/24/2016 1021   PLT 300.0 11/07/2015 1454   MCV 81 12/24/2016 1021   MCH 27.8 12/24/2016 1021   MCH 28.0 07/18/2012 2030   MCHC 34.3 12/24/2016 1021   MCHC 33.3 11/07/2015 1454   RDW 14.6 12/24/2016 1021   LYMPHSABS 2.7 12/24/2016 1021   MONOABS 0.9 11/07/2015 1454  EOSABS 0.2 12/24/2016 1021   BASOSABS 0.1 12/24/2016 1021   Iron/TIBC/Ferritin/ %Sat No results found for: IRON, TIBC, FERRITIN, IRONPCTSAT Lipid Panel     Component Value Date/Time   CHOL 182 08/06/2017 1102   TRIG 73 08/06/2017 1102   HDL 39 (L) 08/06/2017 1102   CHOLHDL 5 03/17/2015 0841   VLDL 22.8 03/17/2015 0841   LDLCALC 128 (H) 08/06/2017 1102   Hepatic Function Panel     Component Value Date/Time   PROT 7.6 11/11/2017 1008   ALBUMIN 4.3 11/11/2017 1008   AST 16 11/11/2017 1008   ALT 26 11/11/2017 1008   ALKPHOS 62 11/11/2017 1008   BILITOT 0.3 11/11/2017 1008   BILIDIR 0.1 11/07/2015 1454      Component Value Date/Time   TSH 1.370 12/24/2016 1021   TSH 1.39 03/17/2015 0841   TSH 1.58 03/02/2014 0911     Ref. Range 11/11/2017 10:08  Vitamin D, 25-Hydroxy Latest Ref Range: 30.0 - 100.0 ng/mL 43.4   ASSESSMENT AND PLAN: Essential hypertension - Plan: diltiazem (CARDIZEM LA) 240 MG 24 hr tablet  Vitamin D deficiency - Plan: Vitamin D, Ergocalciferol, (DRISDOL) 50000 units CAPS capsule  Other depression - with emotional eating - Plan: buPROPion (WELLBUTRIN SR) 150 MG 12 hr tablet  At risk for heart disease  Class 3 severe obesity with serious comorbidity and body mass index (BMI) of 40.0 to 44.9 in adult, unspecified obesity type  (HCC)  PLAN:  Hypertension We discussed sodium restriction, working on healthy weight loss, and a regular exercise program as the means to achieve improved blood pressure control. Daniel [name] agreed with this plan and agreed to follow up as directed. We will continue to monitor his blood pressure as well as his progress with the above lifestyle modifications. He agrees to continue Diltiazem 240 mg qd #30 with no refills and will watch for signs of hypotension as he continues his lifestyle modifications.  Cardiovascular risk counseling Daniel [name] was given extended (15 minutes) coronary artery disease prevention counseling today. He is 48 y.o. male and has risk factors for heart disease including obesity and hypertension. We discussed intensive lifestyle modifications today with an emphasis on specific weight loss instructions and strategies. Pt was also informed of the importance of increasing exercise and decreasing saturated fats to help prevent heart disease.  Vitamin D Deficiency Daniel [name] was informed that low vitamin D levels contributes to fatigue and are associated with obesity, breast, and colon cancer. He agrees to continue to take prescription Vit D @50 ,000 IU every week #4 with no refills and will follow up for routine testing of vitamin D, at least 2-3 times per year. He was informed of the risk of over-replacement of vitamin D and agrees to not increase his dose unless he discusses this with [name] first. Daniel [name] agrees to follow up with our clinic in 3 weeks.  Depression with Emotional Eating Behaviors We discussed behavior modification techniques today to help Daniel [name] deal with his emotional eating and depression. He has agreed to take Wellbutrin SR 150 mg qd #30 with no refills and follow up as directed.  Obesity Daniel [name] is currently in the action stage of change. As such, his goal is to continue with weight loss efforts He has agreed to keep a food journal with 600 calories and 40 grams of protein  at supper daily and follow the Category 3 plan Daniel [name] has been instructed to work up to a goal of 150 minutes of combined cardio and strengthening exercise per week for  weight loss and overall health benefits. We discussed the following Behavioral Modification Strategies today: increasing lean protein intake and keep a strict food journal  Daniel [name] has agreed to follow up with our clinic in 3 weeks. He was informed of the importance of frequent follow up visits to maximize his success with intensive lifestyle modifications for his multiple health conditions.    OBESITY BEHAVIORAL INTERVENTION VISIT  Today's visit was # 22 out of 22.  Starting weight: 309 lbs Starting date: 12/24/16 Today's weight : 284 lbs Today's date: 01/06/2018 Total lbs lost to date: 25 (Patients must lose 7 lbs in the first 6 months to continue with counseling)   ASK: We discussed the diagnosis of obesity with Daniel [name] Bondracey M Westra today and Daniel [name] agreed to give us permission to discuss obesity behavioral modification therapy today.  ASSESS: Daniel [name] has the diagnosis of obesity and his BMI today is 43.19 Daniel [name] is in the action stage of change   ADVISE: Daniel [name] was educated on the multiple health risks of obesity as well as the benefit of weight loss to improve his health. He was advised of the need for long term treatment and the importance of lifestyle modifications.  AGREE: Multiple dietary modification options and treatment options were discussed and  Minoru agreed to the above obesity treatment plan.   Cristi LoronI, Joanne Murray, am acting as transcriptionist for [employer] CorporationSahar Tremain Rucinski, PA-C I, Illa LevelSahar Tailey [facility], have reviewed this note and agree with its content.

## 2018-01-19 ENCOUNTER — Other Ambulatory Visit: Payer: Self-pay | Admitting: Family Medicine

## 2018-01-19 MED FILL — BUPROPION SR 150 MG TABLET: 150 | 30 days supply | Qty: 30 | Fill #0

## 2018-01-19 MED FILL — LEVOCETIRIZINE 5 MG TABLET: 5 | 90 days supply | Qty: 90 | Fill #0

## 2018-01-19 NOTE — Telephone Encounter (Signed)
Pt is due for a wellness OV soon for more refills

## 2018-01-26 MED FILL — MONTELUKAST SOD 10 MG TAB: 10 | 90 days supply | Qty: 90 | Fill #0

## 2018-01-27 ENCOUNTER — Ambulatory Visit (INDEPENDENT_AMBULATORY_CARE_PROVIDER_SITE_OTHER): Payer: 59 | Admitting: Physician Assistant

## 2018-01-27 VITALS — BP 133/84 | HR 67 | Temp 98.2°F | Ht 68.0 in | Wt 284.0 lb

## 2018-01-27 DIAGNOSIS — E559 Vitamin D deficiency, unspecified: Secondary | ICD-10-CM | POA: Diagnosis not present

## 2018-01-27 DIAGNOSIS — Z6841 Body Mass Index (BMI) 40.0 and over, adult: Secondary | ICD-10-CM

## 2018-01-27 NOTE — Progress Notes (Signed)
Office: [phone]  /  Fax: [phone]   HPI:   Chief Complaint: OBESITY Daniel [name] is here to discuss his progress with his obesity treatment plan. He is on the  keep a food journal with 600 calories and 40 grams of protein at supper daily and the Category 3 plan and is following his eating plan approximately 75 % of the time. He states he is doing cardio and weights 90 minutes 5 times per week. Daniel [name] maintained his weight. He has been exercising more and is retaining some fluids. Daniel [name] has been incorporating a protein shake to help with hunger after exercise. His weight is 284 lb (128.8 kg) today and has maintained weight over a period of 3 weeks since his last visit. He has lost 25 lbs since starting treatment with [name].  Vitamin D deficiency Daniel [name] has a diagnosis of vitamin D deficiency. He is currently taking vit D and denies nausea, vomiting or muscle weakness.   Ref. Range 11/11/2017 10:08  Vitamin D, 25-Hydroxy Latest Ref Range: 30.0 - 100.0 ng/mL 43.4    ALLERGIES: Allergies  Allergen Reactions  . Bee Venom Anaphylaxis  . Shellfish Allergy Anaphylaxis    MEDICATIONS: Current Outpatient Medications on File Prior to Visit  Medication Sig Dispense Refill  . albuterol (VENTOLIN HFA) 108 (90 Base) MCG/ACT inhaler INHALE 2 PUFFS INTO THE LUNGS EVERY 4 HOURS AS NEEDED FOR WHEEZING OR SHORTNESS OF BREATH. 18 g 5  . buPROPion (WELLBUTRIN SR) 150 MG 12 hr tablet Take 1 tablet (150 mg total) by mouth daily. 30 tablet 0  . diltiazem (CARDIZEM LA) 240 MG 24 hr tablet Take 1 tablet (240 mg total) by mouth daily. 30 tablet 0  . EPINEPHrine (EPIPEN 2-PAK) 0.3 mg/0.3 mL SOAJ injection Inject 0.3 mLs (0.3 mg total) into the muscle once. 1 Device 11  . levocetirizine (XYZAL) 5 MG tablet TAKE 1 TABLET BY MOUTH EVERY EVENING. 90 tablet 0  . montelukast (SINGULAIR) 10 MG tablet TAKE 1 TABLET (10 MG TOTAL) BY MOUTH AT BEDTIME. 90 tablet 0  . Multiple Vitamin (MULTIVITAMIN WITH MINERALS) TABS  Take 1 tablet by mouth daily.    [name] omeprazole (PRILOSEC) 20 MG capsule TAKE 1 CAPSULE BY MOUTH ONCE DAILY 90 capsule 3  . Polyvinyl Alcohol-Povidone (CLEAR EYES ALL SEASONS) 5-6 MG/ML SOLN Apply 1 drop to eye once as needed. Reported on 02/01/2016    . Vitamin D, Ergocalciferol, (DRISDOL) 50000 units CAPS capsule Take 1 capsule (50,000 Units total) by mouth every 7 (seven) days. 4 capsule 0   No current facility-administered medications on file prior to visit.     PAST MEDICAL HISTORY: Past Medical History:  Diagnosis Date  . Asthma   . GERD (gastroesophageal reflux disease)   . Hypertension     PAST SURGICAL HISTORY: No past surgical history on file.  SOCIAL HISTORY: Social History   Tobacco Use  . Smoking status: Never Smoker  . Smokeless tobacco: Never Used  Substance Use Topics  . Alcohol use: No    Alcohol/week: 0.0 oz  . Drug use: No    FAMILY HISTORY: Family History  Problem Relation Age of Onset  . Hyperlipidemia Unknown   . Hypertension Unknown   . Stroke Unknown   . Hypertension Mother   . Hyperlipidemia Mother   . Obesity Mother   . Sudden death Father     ROS: Review of Systems  Constitutional: Negative for weight loss.  Gastrointestinal: Negative for nausea and vomiting.  Musculoskeletal:  Negative for muscle weakness    PHYSICAL EXAM: Blood pressure 133/84, pulse 67, temperature 98.2 F (36.8 C), temperature source Oral, height 5\' 8"  (1.727 m), weight 284 lb (128.8 kg), SpO2 99 %. Body mass index is 43.18 kg/m. Physical Exam  Constitutional: He is oriented to person, place, and time. He appears well-developed and well-nourished.  Cardiovascular: Normal rate.  Pulmonary/Chest: Effort normal.  Musculoskeletal: Normal range of motion.  Neurological: He is oriented to person, place, and time.  Skin: Skin is warm and dry.  Psychiatric: He has a normal mood and affect. His behavior is normal.  Vitals reviewed.   RECENT LABS AND  TESTS: BMET    Component Value Date/Time   NA 140 11/11/2017 1008   K 4.5 11/11/2017 1008   CL 104 11/11/2017 1008   CO2 24 11/11/2017 1008   GLUCOSE 80 11/11/2017 1008   GLUCOSE 103 (H) 11/07/2015 1454   BUN 16 11/11/2017 1008   CREATININE 1.28 (H) 11/11/2017 1008   CALCIUM 9.4 11/11/2017 1008   GFRNONAA 68 11/11/2017 1008   GFRAA 78 11/11/2017 1008   Lab Results  Component Value Date   HGBA1C 5.4 11/11/2017   HGBA1C 5.3 08/06/2017   HGBA1C 5.6 04/08/2017   HGBA1C 5.5 12/24/2016   Lab Results  Component Value Date   INSULIN 23.2 11/11/2017   INSULIN 27.6 (H) 08/06/2017   INSULIN 28.9 (H) 04/08/2017   INSULIN 25.8 (H) 12/24/2016   CBC    Component Value Date/Time   WBC 6.6 12/24/2016 1021   WBC 10.2 11/07/2015 1454   RBC 5.47 12/24/2016 1021   RBC 5.67 11/07/2015 1454   HGB 15.2 12/24/2016 1021   HCT 44.3 12/24/2016 1021   PLT 300.0 11/07/2015 1454   MCV 81 12/24/2016 1021   MCH 27.8 12/24/2016 1021   MCH 28.0 07/18/2012 2030   MCHC 34.3 12/24/2016 1021   MCHC 33.3 11/07/2015 1454   RDW 14.6 12/24/2016 1021   LYMPHSABS 2.7 12/24/2016 1021   MONOABS 0.9 11/07/2015 1454   EOSABS 0.2 12/24/2016 1021   BASOSABS 0.1 12/24/2016 1021   Iron/TIBC/Ferritin/ %Sat No results found for: IRON, TIBC, FERRITIN, IRONPCTSAT Lipid Panel     Component Value Date/Time   CHOL 182 08/06/2017 1102   TRIG 73 08/06/2017 1102   HDL 39 (L) 08/06/2017 1102   CHOLHDL 5 03/17/2015 0841   VLDL 22.8 03/17/2015 0841   LDLCALC 128 (H) 08/06/2017 1102   Hepatic Function Panel     Component Value Date/Time   PROT 7.6 11/11/2017 1008   ALBUMIN 4.3 11/11/2017 1008   AST 16 11/11/2017 1008   ALT 26 11/11/2017 1008   ALKPHOS 62 11/11/2017 1008   BILITOT 0.3 11/11/2017 1008   BILIDIR 0.1 11/07/2015 1454      Component Value Date/Time   TSH 1.370 12/24/2016 1021   TSH 1.39 03/17/2015 0841   TSH 1.58 03/02/2014 0911    Ref. Range 11/11/2017 10:08  Vitamin D, 25-Hydroxy Latest  Ref Range: 30.0 - 100.0 ng/mL 43.4   ASSESSMENT AND PLAN: Vitamin D deficiency  Class 3 severe obesity with serious comorbidity and body mass index (BMI) of 40.0 to 44.9 in adult, unspecified obesity type (HCC)  PLAN:  Vitamin D Deficiency Daniel [name] was informed that low vitamin D levels contributes to fatigue and are associated with obesity, breast, and colon cancer. He agrees to continue to take prescription Vit D @50 ,000 IU every week and will follow up for routine testing of vitamin D, at least 2-3 times  per year. He was informed of the risk of over-replacement of vitamin D and agrees to not increase his dose unless he discusses this with [name] first.  We spent > than 50% of the 15 minute visit on the counseling as documented in the note.  Obesity Daniel [name] is currently in the action stage of change. As such, his goal is to continue with weight loss efforts He has agreed to keep a food journal with 600 calories and 40 grams of protein daily and follow the Category 3 plan Daniel [name] has been instructed to work up to a goal of 150 minutes of combined cardio and strengthening exercise per week for weight loss and overall health benefits. We discussed the following Behavioral Modification Strategies today: increasing lean protein intake and planning for success  Daniel [name] has agreed to follow up with our clinic in 2 weeks. He was informed of the importance of frequent follow up visits to maximize his success with intensive lifestyle modifications for his multiple health conditions.   Cristi Loron, am acting as transcriptionist for [employer], PA-C I, Illa Level [facility], have reviewed this note and agree with its content.

## 2018-02-10 ENCOUNTER — Encounter (INDEPENDENT_AMBULATORY_CARE_PROVIDER_SITE_OTHER): Payer: Self-pay | Admitting: Physician Assistant

## 2018-02-10 ENCOUNTER — Ambulatory Visit (INDEPENDENT_AMBULATORY_CARE_PROVIDER_SITE_OTHER): Payer: 59 | Admitting: Physician Assistant

## 2018-02-10 VITALS — BP 137/84 | HR 70 | Temp 98.1°F | Ht 68.0 in | Wt 285.0 lb

## 2018-02-10 DIAGNOSIS — E559 Vitamin D deficiency, unspecified: Secondary | ICD-10-CM

## 2018-02-10 DIAGNOSIS — I10 Essential (primary) hypertension: Secondary | ICD-10-CM

## 2018-02-10 DIAGNOSIS — Z9189 Other specified personal risk factors, not elsewhere classified: Secondary | ICD-10-CM

## 2018-02-10 DIAGNOSIS — Z6841 Body Mass Index (BMI) 40.0 and over, adult: Secondary | ICD-10-CM

## 2018-02-10 MED ORDER — CHLORTHALIDONE 25 MG PO TABS: 25.0000 mg | ORAL_TABLET | Freq: Every day | ORAL | 0 refills | Status: DC

## 2018-02-10 MED FILL — CHLORTHALIDONE 25 MG TAB: 25 | 30 days supply | Qty: 30 | Fill #0

## 2018-02-10 NOTE — Progress Notes (Signed)
Office: [phone]  /  Fax: [phone]   HPI:   Chief Complaint: OBESITY Daniel [name] is here to discuss his progress with his obesity treatment plan. He is on the  keep a food journal with 600 calories and 40 grams of protein at supper daily and the Category 3 plan and is following his eating plan approximately 75 to 80 % of the time. He states he is doing cardio and strength training for 60 minutes 5 to 7 times per week. Daniel [name] states he continues to retain fluids, although he keeps up with his water intake. His weight is 285 lb (129.3 kg) today and has had a weight gain of 1 pound over a period of 2 weeks since his last visit. He has lost 24 lbs since starting treatment with us.  Hypertension Daniel [name] is a 47 y.o. male with hypertension. He admits to retaining fluids after discontinuation of his Hygroton. Daniel [name] will go back on Hygroton 25 mg, as his blood pressure has been elevated on multiple occasions in our office. Daniel [name] denies chest pain or shortness of breath on exertion. He is working weight loss to help control his blood pressure with the goal of decreasing his risk of heart attack and stroke. [name] blood pressure is not currently controlled.  At risk for cardiovascular disease Daniel [name] is at a higher than average risk for cardiovascular disease due to obesity and hypertension. He currently denies any chest pain.  Vitamin D deficiency Daniel [name] has a diagnosis of vitamin D deficiency. He is currently taking vit D and denies nausea, vomiting or muscle weakness.  ALLERGIES: Allergies  Allergen Reactions  . Bee Venom Anaphylaxis  . Shellfish Allergy Anaphylaxis    MEDICATIONS: Current Outpatient Medications on File Prior to Visit  Medication Sig Dispense Refill  . albuterol (VENTOLIN HFA) 108 (90 Base) MCG/ACT inhaler INHALE 2 PUFFS INTO THE LUNGS EVERY 4 HOURS AS NEEDED FOR WHEEZING OR SHORTNESS OF BREATH. 18 g 5  . buPROPion (WELLBUTRIN SR) 150 MG 12 hr tablet  Take 1 tablet (150 mg total) by mouth daily. 30 tablet 0  . diltiazem (CARDIZEM LA) 240 MG 24 hr tablet Take 1 tablet (240 mg total) by mouth daily. 30 tablet 0  . EPINEPHrine (EPIPEN 2-PAK) 0.3 mg/0.3 mL SOAJ injection Inject 0.3 mLs (0.3 mg total) into the muscle once. 1 Device 11  . levocetirizine (XYZAL) 5 MG tablet TAKE 1 TABLET BY MOUTH EVERY EVENING. 90 tablet 0  . montelukast (SINGULAIR) 10 MG tablet TAKE 1 TABLET (10 MG TOTAL) BY MOUTH AT BEDTIME. 90 tablet 0  . Multiple Vitamin (MULTIVITAMIN WITH MINERALS) TABS Take 1 tablet by mouth daily.    [name]. omeprazole (PRILOSEC) 20 MG capsule TAKE 1 CAPSULE BY MOUTH ONCE DAILY 90 capsule 3  . Polyvinyl Alcohol-Povidone (CLEAR EYES ALL SEASONS) 5-6 MG/ML SOLN Apply 1 drop to eye once as needed. Reported on 02/01/2016    . Vitamin D, Ergocalciferol, (DRISDOL) 50000 units CAPS capsule Take 1 capsule (50,000 Units total) by mouth every 7 (seven) days. 4 capsule 0   No current facility-administered medications on file prior to visit.     PAST MEDICAL HISTORY: Past Medical History:  Diagnosis Date  . Asthma   . GERD (gastroesophageal reflux disease)   . Hypertension     PAST SURGICAL HISTORY: No past surgical history on file.  SOCIAL HISTORY: Social History   Tobacco Use  . Smoking status: Never Smoker  . Smokeless tobacco: Never Used  Substance Use  Topics  . Alcohol use: No    Alcohol/week: 0.0 oz  . Drug use: No    FAMILY HISTORY: Family History  Problem Relation Age of Onset  . Hyperlipidemia Unknown   . Hypertension Unknown   . Stroke Unknown   . Hypertension Mother   . Hyperlipidemia Mother   . Obesity Mother   . Sudden death Father     ROS: Review of Systems  Constitutional: Negative for weight loss.  Respiratory: Negative for shortness of breath (on exertion).   Cardiovascular: Negative for chest pain.  Gastrointestinal: Negative for nausea and vomiting.  Musculoskeletal:       Negative for muscle weakness     PHYSICAL EXAM: Blood pressure 137/84, pulse 70, temperature 98.1 F (36.7 C), temperature source Oral, height 5\' 8"  (1.727 m), weight 285 lb (129.3 kg), SpO2 98 %. Body mass index is 43.33 kg/m. Physical Exam  Constitutional: He is oriented to person, place, and time. He appears well-developed and well-nourished.  Cardiovascular: Normal rate.  Pulmonary/Chest: Effort normal.  Musculoskeletal: Normal range of motion.  Neurological: He is oriented to person, place, and time.  Skin: Skin is warm and dry.  Psychiatric: He has a normal mood and affect. His behavior is normal.  Vitals reviewed.   RECENT LABS AND TESTS: BMET    Component Value Date/Time   NA 140 11/11/2017 1008   K 4.5 11/11/2017 1008   CL 104 11/11/2017 1008   CO2 24 11/11/2017 1008   GLUCOSE 80 11/11/2017 1008   GLUCOSE 103 (H) 11/07/2015 1454   BUN 16 11/11/2017 1008   CREATININE 1.28 (H) 11/11/2017 1008   CALCIUM 9.4 11/11/2017 1008   GFRNONAA 68 11/11/2017 1008   GFRAA 78 11/11/2017 1008   Lab Results  Component Value Date   HGBA1C 5.4 11/11/2017   HGBA1C 5.3 08/06/2017   HGBA1C 5.6 04/08/2017   HGBA1C 5.5 12/24/2016   Lab Results  Component Value Date   INSULIN 23.2 11/11/2017   INSULIN 27.6 (H) 08/06/2017   INSULIN 28.9 (H) 04/08/2017   INSULIN 25.8 (H) 12/24/2016   CBC    Component Value Date/Time   WBC 6.6 12/24/2016 1021   WBC 10.2 11/07/2015 1454   RBC 5.47 12/24/2016 1021   RBC 5.67 11/07/2015 1454   HGB 15.2 12/24/2016 1021   HCT 44.3 12/24/2016 1021   PLT 300.0 11/07/2015 1454   MCV 81 12/24/2016 1021   MCH 27.8 12/24/2016 1021   MCH 28.0 07/18/2012 2030   MCHC 34.3 12/24/2016 1021   MCHC 33.3 11/07/2015 1454   RDW 14.6 12/24/2016 1021   LYMPHSABS 2.7 12/24/2016 1021   MONOABS 0.9 11/07/2015 1454   EOSABS 0.2 12/24/2016 1021   BASOSABS 0.1 12/24/2016 1021   Iron/TIBC/Ferritin/ %Sat No results found for: IRON, TIBC, FERRITIN, IRONPCTSAT Lipid Panel     Component Value  Date/Time   CHOL 182 08/06/2017 1102   TRIG 73 08/06/2017 1102   HDL 39 (L) 08/06/2017 1102   CHOLHDL 5 03/17/2015 0841   VLDL 22.8 03/17/2015 0841   LDLCALC 128 (H) 08/06/2017 1102   Hepatic Function Panel     Component Value Date/Time   PROT 7.6 11/11/2017 1008   ALBUMIN 4.3 11/11/2017 1008   AST 16 11/11/2017 1008   ALT 26 11/11/2017 1008   ALKPHOS 62 11/11/2017 1008   BILITOT 0.3 11/11/2017 1008   BILIDIR 0.1 11/07/2015 1454      Component Value Date/Time   TSH 1.370 12/24/2016 1021   TSH 1.39 03/17/2015  0841   TSH 1.58 03/02/2014 0911   Results for CORDARRELL, SANE (MRN [id number]) as of 02/10/2018 14:10  Ref. Range 11/11/2017 10:08  Vitamin D, 25-Hydroxy Latest Ref Range: 30.0 - 100.0 ng/mL 43.4   ASSESSMENT AND PLAN: Essential hypertension  Vitamin D deficiency  At risk for heart disease  Class 3 severe obesity with serious comorbidity and body mass index (BMI) of 40.0 to 44.9 in adult, unspecified obesity type (HCC)  PLAN:  Hypertension We discussed sodium restriction, working on healthy weight loss, and a regular exercise program as the means to achieve improved blood pressure control. [place] agreed with this plan and agreed to follow up as directed. We will continue to monitor his blood pressure as well as his progress with the above lifestyle modifications. He agrees to restart Hygroton 25 mg #30 with no refills and continue Diltiazem as directed and will watch for signs of hypotension as he continues his lifestyle modifications.  Cardiovascular risk counseling Patric was given extended (15 minutes) coronary artery disease prevention counseling today. He is 47 y.o. male and has risk factors for heart disease including obesity and hypertension. We discussed intensive lifestyle modifications today with an emphasis on specific weight loss instructions and strategies. Pt was also informed of the importance of increasing exercise and decreasing saturated fats to help  prevent heart disease.  Vitamin D Deficiency Kristofer was informed that low vitamin D levels contributes to fatigue and are associated with obesity, breast, and colon cancer. He agrees to continue to take prescription Vit D @50 ,000 IU every week and will follow up for routine testing of vitamin D, at least 2-3 times per year. He was informed of the risk of over-replacement of vitamin D and agrees to not increase his dose unless he discusses this with [name] first.  Obesity Ellard is currently in the action stage of change. As such, his goal is to continue with weight loss efforts He has agreed to keep a food journal with 600 calories and 40 grams of protein at supper daily and follow the Category 3 plan Jaclyn has been instructed to work up to a goal of 150 minutes of combined cardio and strengthening exercise per week for weight loss and overall health benefits. We discussed the following Behavioral Modification Strategies today: increasing lean protein intake and work on meal planning and easy cooking plans  Keeshawn has agreed to follow up with our clinic in 2 weeks. He was informed of the importance of frequent follow up visits to maximize his success with intensive lifestyle modifications for his multiple health conditions.   Cristi Loron, am acting as transcriptionist for [employer], PA-C I, Illa Level [facility], have reviewed this note and agree with its content

## 2018-02-18 ENCOUNTER — Other Ambulatory Visit (INDEPENDENT_AMBULATORY_CARE_PROVIDER_SITE_OTHER): Payer: Self-pay | Admitting: Physician Assistant

## 2018-02-18 DIAGNOSIS — I10 Essential (primary) hypertension: Secondary | ICD-10-CM

## 2018-02-24 ENCOUNTER — Ambulatory Visit (INDEPENDENT_AMBULATORY_CARE_PROVIDER_SITE_OTHER): Payer: 59 | Admitting: Physician Assistant

## 2018-02-24 ENCOUNTER — Encounter (INDEPENDENT_AMBULATORY_CARE_PROVIDER_SITE_OTHER): Payer: Self-pay | Admitting: Physician Assistant

## 2018-02-24 VITALS — BP 145/85 | HR 73 | Temp 98.0°F | Ht 68.0 in | Wt 286.0 lb

## 2018-02-24 DIAGNOSIS — I10 Essential (primary) hypertension: Secondary | ICD-10-CM | POA: Diagnosis not present

## 2018-02-24 DIAGNOSIS — E559 Vitamin D deficiency, unspecified: Secondary | ICD-10-CM

## 2018-02-24 DIAGNOSIS — Z6841 Body Mass Index (BMI) 40.0 and over, adult: Secondary | ICD-10-CM

## 2018-02-24 DIAGNOSIS — F3289 Other specified depressive episodes: Secondary | ICD-10-CM

## 2018-02-24 DIAGNOSIS — Z9189 Other specified personal risk factors, not elsewhere classified: Secondary | ICD-10-CM

## 2018-02-24 MED ORDER — BUPROPION HCL ER (SR) 150 MG PO TB12: 150.0000 mg | ORAL_TABLET | Freq: Every day | ORAL | 0 refills | Status: DC

## 2018-02-24 MED ORDER — DILTIAZEM HCL ER COATED BEADS 240 MG PO TB24: 240.0000 mg | ORAL_TABLET | Freq: Every day | ORAL | 0 refills | Status: DC

## 2018-02-24 MED ORDER — VITAMIN D (ERGOCALCIFEROL) 1.25 MG (50000 UNIT) PO CAPS
50000.0000 [IU] | ORAL_CAPSULE | ORAL | 0 refills | Status: DC
Start: 2018-02-24 — End: 2018-03-31

## 2018-02-24 MED FILL — VIT D2 1.25 MG (50,000 UNIT: 1.25 MG | 28 days supply | Qty: 4 | Fill #0

## 2018-02-24 MED FILL — BUPROPION SR 150 MG TABLET: 150 | 30 days supply | Qty: 30 | Fill #0

## 2018-02-24 MED FILL — DILTIAZEM ER 240 MG TABLET: 240 | 30 days supply | Qty: 30 | Fill #0

## 2018-02-24 NOTE — Progress Notes (Signed)
Office: [phone]  /  Fax: [phone]   HPI:   Chief Complaint: OBESITY Daniel [name] is here to discuss his progress with his obesity treatment plan. He is on the keep a food journal with 600 calories and 40 grams of protein at supper daily and follow the Category 3 plan and is following his eating plan approximately 75 % of the time. He states he is doing cardio and weights for 60 minutes 7 times per week. Daniel [name] states he is working out more and is thus retaining more fluids. He states he is following the meal plan.  His weight is 286 lb (129.7 kg) today and has gained 1 pound since his last visit. He has lost 23 lbs since starting treatment with us.  Hypertension Daniel [name] Daniel [name] is a 47 y.o. male with hypertension. Daniel [name]'s blood pressure is elevates. He is on Hygroton and diltiazem. He has been out of his diltiazem for the past 3 days. He denies chest pain or shortness of breath. He is working weight loss to help control his blood pressure with the goal of decreasing his risk of heart attack and stroke. Daniel [name]'s blood pressure is not currently controlled.  At risk for cardiovascular disease Daniel [name] is at a higher than average risk for cardiovascular disease due to obesity and hypertension. He currently denies any chest pain.  Vitamin D Deficiency Daniel [name] has a diagnosis of vitamin D deficiency. He is currently taking prescription Vit D and denies nausea, vomiting or muscle weakness.  Depression with emotional eating behaviors Daniel [name] is struggling with emotional eating and using food for comfort to the extent that it is negatively impacting his health. He often snacks when he is not hungry. Daniel [name] sometimes feels he is out of control and then feels guilty that he made poor food choices. He has been working on behavior modification techniques to help reduce his emotional eating and has been somewhat successful. His mood is stable and he shows no sign of suicidal or homicidal  ideations.  [facility]  Decreased Interest 0  Down, Depressed, Hopeless 0  PHQ - 2 Score 0  Altered sleeping 0  Tired, decreased energy 1  Change in appetite 0  Feeling bad or failure about yourself  0  Trouble concentrating 1  Moving slowly or fidgety/restless 0  Suicidal thoughts 0  PHQ-9 Score 2   ALLERGIES: Allergies  Allergen Reactions  . Bee Venom Anaphylaxis  . Shellfish Allergy Anaphylaxis    MEDICATIONS: Current Outpatient Medications on File Prior to Visit  Medication Sig Dispense Refill  . albuterol (VENTOLIN HFA) 108 (90 Base) MCG/ACT inhaler INHALE 2 PUFFS INTO THE LUNGS EVERY 4 HOURS AS NEEDED FOR WHEEZING OR SHORTNESS OF BREATH. 18 g 5  . chlorthalidone (HYGROTON) 25 MG tablet Take 1 tablet (25 mg total) by mouth daily. 30 tablet 0  . EPINEPHrine (EPIPEN 2-PAK) 0.3 mg/0.3 mL SOAJ injection Inject 0.3 mLs (0.3 mg total) into the muscle once. 1 Device 11  . levocetirizine (XYZAL) 5 MG tablet TAKE 1 TABLET BY MOUTH EVERY EVENING. 90 tablet 0  . montelukast (SINGULAIR) 10 MG tablet TAKE 1 TABLET (10 MG TOTAL) BY MOUTH AT BEDTIME. 90 tablet 0  . Multiple Vitamin (MULTIVITAMIN WITH MINERALS) TABS Take 1 tablet by mouth daily.    [name]. omeprazole (PRILOSEC) 20 MG capsule TAKE 1 CAPSULE BY MOUTH ONCE DAILY 90 capsule 3  . Polyvinyl Alcohol-Povidone (CLEAR EYES ALL SEASONS) 5-6 MG/ML SOLN Apply 1 drop to eye once as needed.  Reported on 02/01/2016     No current facility-administered medications on file prior to visit.     PAST MEDICAL HISTORY: Past Medical History:  Diagnosis Date  . Asthma   . GERD (gastroesophageal reflux disease)   . Hypertension     PAST SURGICAL HISTORY: No past surgical history on file.  SOCIAL HISTORY: Social History   Tobacco Use  . Smoking status: Never Smoker  . Smokeless tobacco: Never Used  Substance Use Topics  . Alcohol use: No    Alcohol/week: 0.0 oz  . Drug use: No    FAMILY HISTORY: Family History   Problem Relation Age of Onset  . Hyperlipidemia Unknown   . Hypertension Unknown   . Stroke Unknown   . Hypertension Mother   . Hyperlipidemia Mother   . Obesity Mother   . Sudden death Father     ROS: Review of Systems  Constitutional: Negative for weight loss.  Respiratory: Negative for shortness of breath.   Cardiovascular: Negative for chest pain.  Gastrointestinal: Negative for nausea and vomiting.  Musculoskeletal:       Negative muscle weakness  Psychiatric/Behavioral: Positive for depression. Negative for suicidal ideas.    PHYSICAL EXAM: Blood pressure (!) 145/85, pulse 73, temperature 98 F (36.7 C), temperature source Oral, height 5\' 8"  (1.727 Daniel), weight 286 lb (129.7 kg), SpO2 99 %. Body mass index is 43.49 kg/Daniel. Physical Exam  Constitutional: He is oriented to person, place, and time. He appears well-developed and well-nourished.  Cardiovascular: Normal rate.  Pulmonary/Chest: Effort normal.  Musculoskeletal: Normal range of motion.  Neurological: He is oriented to person, place, and time.  Skin: Skin is warm and dry.  Psychiatric: He has a normal mood and affect. His behavior is normal.  Vitals reviewed.   RECENT LABS AND TESTS: BMET    Component Value Date/Time   NA 140 11/11/2017 1008   K 4.5 11/11/2017 1008   CL 104 11/11/2017 1008   CO2 24 11/11/2017 1008   GLUCOSE 80 11/11/2017 1008   GLUCOSE 103 (H) 11/07/2015 1454   BUN 16 11/11/2017 1008   CREATININE 1.28 (H) 11/11/2017 1008   CALCIUM 9.4 11/11/2017 1008   GFRNONAA 68 11/11/2017 1008   GFRAA 78 11/11/2017 1008   Lab Results  Component Value Date   HGBA1C 5.4 11/11/2017   HGBA1C 5.3 08/06/2017   HGBA1C 5.6 04/08/2017   HGBA1C 5.5 12/24/2016   Lab Results  Component Value Date   INSULIN 23.2 11/11/2017   INSULIN 27.6 (H) 08/06/2017   INSULIN 28.9 (H) 04/08/2017   INSULIN 25.8 (H) 12/24/2016   CBC    Component Value Date/Time   WBC 6.6 12/24/2016 1021   WBC 10.2 11/07/2015  1454   RBC 5.47 12/24/2016 1021   RBC 5.67 11/07/2015 1454   HGB 15.2 12/24/2016 1021   HCT 44.3 12/24/2016 1021   PLT 300.0 11/07/2015 1454   MCV 81 12/24/2016 1021   MCH 27.8 12/24/2016 1021   MCH 28.0 07/18/2012 2030   MCHC 34.3 12/24/2016 1021   MCHC 33.3 11/07/2015 1454   RDW 14.6 12/24/2016 1021   LYMPHSABS 2.7 12/24/2016 1021   MONOABS 0.9 11/07/2015 1454   EOSABS 0.2 12/24/2016 1021   BASOSABS 0.1 12/24/2016 1021   Iron/TIBC/Ferritin/ %Sat No results found for: IRON, TIBC, FERRITIN, IRONPCTSAT Lipid Panel     Component Value Date/Time   CHOL 182 08/06/2017 1102   TRIG 73 08/06/2017 1102   HDL 39 (L) 08/06/2017 1102   CHOLHDL 5 03/17/2015  0841   VLDL 22.8 03/17/2015 0841   LDLCALC 128 (H) 08/06/2017 1102   Hepatic Function Panel     Component Value Date/Time   PROT 7.6 11/11/2017 1008   ALBUMIN 4.3 11/11/2017 1008   AST 16 11/11/2017 1008   ALT 26 11/11/2017 1008   ALKPHOS 62 11/11/2017 1008   BILITOT 0.3 11/11/2017 1008   BILIDIR 0.1 11/07/2015 1454      Component Value Date/Time   TSH 1.370 12/24/2016 1021   TSH 1.39 03/17/2015 0841   TSH 1.58 03/02/2014 0911  Results for BREYLIN, DOM (MRN [id number]) as of 02/24/2018 11:19  Ref. Range 11/11/2017 10:08  Vitamin D, 25-Hydroxy Latest Ref Range: 30.0 - 100.0 ng/mL 43.4    ASSESSMENT AND PLAN: Essential hypertension - Plan: diltiazem (CARDIZEM LA) 240 MG 24 hr tablet  Vitamin D deficiency - Plan: Vitamin D, Ergocalciferol, (DRISDOL) 50000 units CAPS capsule  Other depression - with emotional eating - Plan: buPROPion (WELLBUTRIN SR) 150 MG 12 hr tablet  At risk for heart disease  Class 3 severe obesity with serious comorbidity and body mass index (BMI) of 40.0 to 44.9 in adult, unspecified obesity type (HCC)  PLAN:  Hypertension We discussed sodium restriction, working on healthy weight loss, and a regular exercise program as the means to achieve improved blood pressure control. Brendon agreed with  this plan and agreed to follow up as directed. We will continue to monitor his blood pressure as well as his progress with the above lifestyle modifications. Ramar agrees to continue taking diltiazem 240 mg qd #30 and we will refill for 1 month and he will watch for signs of hypotension as he continues his lifestyle modifications. Hassan agrees to follow up with our clinic in 3 weeks.  Cardiovascular risk counselling Trenton was given extended (15 minutes) coronary artery disease prevention counseling today. He is 47 y.o. male and has risk factors for heart disease including obesity and hypertension. We discussed intensive lifestyle modifications today with an emphasis on specific weight loss instructions and strategies. Pt was also informed of the importance of increasing exercise and decreasing saturated fats to help prevent heart disease.  Vitamin D Deficiency Jaxsen was informed that low vitamin D levels contributes to fatigue and are associated with obesity, breast, and colon cancer. Naphtali agrees to continue taking prescription Vit D @50 ,000 IU every week #4 and we will refill for 1 month. He will follow up for routine testing of vitamin D, at least 2-3 times per year. He was informed of the risk of over-replacement of vitamin D and agrees to not increase his dose unless he discusses this with [name] first. Kwasi agrees to follow up with our clinic in 3 weeks.  Depression with Emotional Eating Behaviors We discussed behavior modification techniques today to help Pradeep deal with his emotional eating and depression. Bralyn agrees to continue taking Wellbutrin SR 150 mg qd #30 and we will refill for 1 month. Burr agrees to follow up with our clinic in 3 weeks.  Obesity Tejon is currently in the action stage of change. As such, his goal is to continue with weight loss efforts He has agreed to keep a food journal with 600 calories and 40 grams of protein at supper daily and follow the Category 3  plan Logon has been instructed to work up to a goal of 150 minutes of combined cardio and strengthening exercise per week for weight loss and overall health benefits. We discussed the following Behavioral Modification Strategies today: increasing  lean protein intake and work on meal planning and easy cooking plans   Earnie has agreed to follow up with our clinic in 3 weeks. He was informed of the importance of frequent follow up visits to maximize his success with intensive lifestyle modifications for his multiple health conditions.   Trude Mcburney, am acting as transcriptionist for Illa Level, PA-C I, Illa Level [facility], have reviewed this note and agree with its content

## 2018-03-10 ENCOUNTER — Ambulatory Visit (INDEPENDENT_AMBULATORY_CARE_PROVIDER_SITE_OTHER): Payer: 59 | Admitting: Physician Assistant

## 2018-03-10 VITALS — BP 148/77 | HR 80 | Temp 98.0°F | Ht 68.0 in | Wt 284.0 lb

## 2018-03-10 DIAGNOSIS — K5909 Other constipation: Secondary | ICD-10-CM

## 2018-03-10 DIAGNOSIS — Z9189 Other specified personal risk factors, not elsewhere classified: Secondary | ICD-10-CM

## 2018-03-10 DIAGNOSIS — I10 Essential (primary) hypertension: Secondary | ICD-10-CM | POA: Diagnosis not present

## 2018-03-10 DIAGNOSIS — Z6841 Body Mass Index (BMI) 40.0 and over, adult: Secondary | ICD-10-CM

## 2018-03-10 MED ORDER — CHLORTHALIDONE 25 MG PO TABS
25.0000 mg | ORAL_TABLET | Freq: Every day | ORAL | 0 refills | Status: DC
Start: 2018-03-10 — End: 2018-03-31

## 2018-03-10 MED ORDER — POLYETHYLENE GLYCOL 3350 17 GM/SCOOP PO POWD: 17.0000 g | Freq: Every day | ORAL | 1 refills | Status: DC | PRN

## 2018-03-10 MED FILL — CHLORTHALIDONE 25 MG TAB: 25 | 30 days supply | Qty: 30 | Fill #0

## 2018-03-10 NOTE — Progress Notes (Signed)
Office: [phone]  /  Fax: [phone]   HPI:   Chief Complaint: OBESITY Daniel [name] is here to discuss his progress with his obesity treatment plan. He is on the keep a food journal with 600 calories and 40 grams of protein at supper daily and follow the Category 3 plan and is following his eating plan approximately 80 % of the time. He states he is doing cardio and weight training for 60 minutes 5 times per week. Daniel [name] continues to do well with weight loss. He is not keeping a strict food journal and would like more meal planning ideas.  His weight is 284 lb (128.8 kg) today and has had a weight loss of 2 pounds over a period of 2 weeks since his last visit. He has lost 25 lbs since starting treatment with [name].  Hypertension Daniel [name] is a 47 y.o. male with hypertension. Daniel [name]'s blood pressure is elevated. He admits to drinking more caffeine. He denies chest pain or shortness of breath. He is working weight loss to help control his blood pressure with the goal of decreasing his risk of heart attack and stroke. Daniel [name] blood pressure is not currently controlled.  At risk for cardiovascular disease Daniel [name] is at a higher than average risk for cardiovascular disease due to obesity and hypertension. He currently denies any chest pain.  Constipation Daniel [name] notes constipation for the last few weeks, worse since attempting weight loss. He states BM are less frequent and are not hard and painful. He denies hematochezia or melena. He denies drinking less H20 recently.  ALLERGIES: Allergies  Allergen Reactions  . Bee Venom Anaphylaxis  . Shellfish Allergy Anaphylaxis    MEDICATIONS: Current Outpatient Medications on File Prior to Visit  Medication Sig Dispense Refill  . albuterol (VENTOLIN HFA) 108 (90 Base) MCG/ACT inhaler INHALE 2 PUFFS INTO THE LUNGS EVERY 4 HOURS AS NEEDED FOR WHEEZING OR SHORTNESS OF BREATH. 18 g 5  . buPROPion (WELLBUTRIN SR) 150 MG 12 hr tablet Take 1 tablet (150  mg total) by mouth daily. 30 tablet 0  . diltiazem (CARDIZEM LA) 240 MG 24 hr tablet Take 1 tablet (240 mg total) by mouth daily. 30 tablet 0  . EPINEPHrine (EPIPEN 2-PAK) 0.3 mg/0.3 mL SOAJ injection Inject 0.3 mLs (0.3 mg total) into the muscle once. 1 Device 11  . levocetirizine (XYZAL) 5 MG tablet TAKE 1 TABLET BY MOUTH EVERY EVENING. 90 tablet 0  . montelukast (SINGULAIR) 10 MG tablet TAKE 1 TABLET (10 MG TOTAL) BY MOUTH AT BEDTIME. 90 tablet 0  . Multiple Vitamin (MULTIVITAMIN WITH MINERALS) TABS Take 1 tablet by mouth daily.    [name] omeprazole (PRILOSEC) 20 MG capsule TAKE 1 CAPSULE BY MOUTH ONCE DAILY 90 capsule 3  . Polyvinyl Alcohol-Povidone (CLEAR EYES ALL SEASONS) 5-6 MG/ML SOLN Apply 1 drop to eye once as needed. Reported on 02/01/2016    . Vitamin D, Ergocalciferol, (DRISDOL) 50000 units CAPS capsule Take 1 capsule (50,000 Units total) by mouth every 7 (seven) days. 4 capsule 0   No current facility-administered medications on file prior to visit.     PAST MEDICAL HISTORY: Past Medical History:  Diagnosis Date  . Asthma   . GERD (gastroesophageal reflux disease)   . Hypertension     PAST SURGICAL HISTORY: No past surgical history on file.  SOCIAL HISTORY: Social History   Tobacco Use  . Smoking status: Never Smoker  . Smokeless tobacco: Never Used  Substance Use Topics  . Alcohol use: No  Alcohol/week: 0.0 oz  . Drug use: No    FAMILY HISTORY: Family History  Problem Relation Age of Onset  . Hyperlipidemia Unknown   . Hypertension Unknown   . Stroke Unknown   . Hypertension Mother   . Hyperlipidemia Mother   . Obesity Mother   . Sudden death Father     ROS: Review of Systems  Constitutional: Positive for weight loss.  Respiratory: Negative for shortness of breath.   Cardiovascular: Negative for chest pain.  Gastrointestinal: Positive for constipation. Negative for melena.       Negative hematochezia    PHYSICAL EXAM: Blood pressure (!) 148/77,  pulse 80, temperature 98 F (36.7 C), temperature source Oral, height 5\' 8"  (1.727 m), weight 284 lb (128.8 kg), SpO2 97 %. Body mass index is 43.18 kg/m. Physical Exam  Constitutional: He is oriented to person, place, and time. He appears well-developed and well-nourished.  Cardiovascular: Normal rate.  Pulmonary/Chest: Effort normal.  Musculoskeletal: Normal range of motion.  Neurological: He is oriented to person, place, and time.  Skin: Skin is warm and dry.  Psychiatric: He has a normal mood and affect. His behavior is normal.  Vitals reviewed.   RECENT LABS AND TESTS: BMET    Component Value Date/Time   NA 140 11/11/2017 1008   K 4.5 11/11/2017 1008   CL 104 11/11/2017 1008   CO2 24 11/11/2017 1008   GLUCOSE 80 11/11/2017 1008   GLUCOSE 103 (H) 11/07/2015 1454   BUN 16 11/11/2017 1008   CREATININE 1.28 (H) 11/11/2017 1008   CALCIUM 9.4 11/11/2017 1008   GFRNONAA 68 11/11/2017 1008   GFRAA 78 11/11/2017 1008   Lab Results  Component Value Date   HGBA1C 5.4 11/11/2017   HGBA1C 5.3 08/06/2017   HGBA1C 5.6 04/08/2017   HGBA1C 5.5 12/24/2016   Lab Results  Component Value Date   INSULIN 23.2 11/11/2017   INSULIN 27.6 (H) 08/06/2017   INSULIN 28.9 (H) 04/08/2017   INSULIN 25.8 (H) 12/24/2016   CBC    Component Value Date/Time   WBC 6.6 12/24/2016 1021   WBC 10.2 11/07/2015 1454   RBC 5.47 12/24/2016 1021   RBC 5.67 11/07/2015 1454   HGB 15.2 12/24/2016 1021   HCT 44.3 12/24/2016 1021   PLT 300.0 11/07/2015 1454   MCV 81 12/24/2016 1021   MCH 27.8 12/24/2016 1021   MCH 28.0 07/18/2012 2030   MCHC 34.3 12/24/2016 1021   MCHC 33.3 11/07/2015 1454   RDW 14.6 12/24/2016 1021   LYMPHSABS 2.7 12/24/2016 1021   MONOABS 0.9 11/07/2015 1454   EOSABS 0.2 12/24/2016 1021   BASOSABS 0.1 12/24/2016 1021   Iron/TIBC/Ferritin/ %Sat No results found for: IRON, TIBC, FERRITIN, IRONPCTSAT Lipid Panel     Component Value Date/Time   CHOL 182 08/06/2017 1102   TRIG  73 08/06/2017 1102   HDL 39 (L) 08/06/2017 1102   CHOLHDL 5 03/17/2015 0841   VLDL 22.8 03/17/2015 0841   LDLCALC 128 (H) 08/06/2017 1102   Hepatic Function Panel     Component Value Date/Time   PROT 7.6 11/11/2017 1008   ALBUMIN 4.3 11/11/2017 1008   AST 16 11/11/2017 1008   ALT 26 11/11/2017 1008   ALKPHOS 62 11/11/2017 1008   BILITOT 0.3 11/11/2017 1008   BILIDIR 0.1 11/07/2015 1454      Component Value Date/Time   TSH 1.370 12/24/2016 1021   TSH 1.39 03/17/2015 0841   TSH 1.58 03/02/2014 0911    ASSESSMENT AND PLAN:  Essential hypertension - Plan: chlorthalidone (HYGROTON) 25 MG tablet  Other constipation - Plan: polyethylene glycol powder (GLYCOLAX/MIRALAX) powder  At risk for heart disease  Class 3 severe obesity with serious comorbidity and body mass index (BMI) of 40.0 to 44.9 in adult, unspecified obesity type (HCC)  PLAN:  Hypertension We discussed sodium restriction, working on healthy weight loss, and a regular exercise program as the means to achieve improved blood pressure control. Daniel [name] agreed with this plan and agreed to follow up as directed. We will continue to monitor his blood pressure as well as his progress with the above lifestyle modifications. Daniel [name] agrees to continue taking Hygroton 25 mg qd #30 and we will refill for 1 month. He will watch for signs of hypotension as he continues his lifestyle modifications. Daniel [name] agrees to follow up with our clinic in 3 weeks.  Cardiovascular risk counselling Daniel [name] was given extended (15 minutes) coronary artery disease prevention counseling today. He is 47 y.o. male and has risk factors for heart disease including obesity and hypertension. We discussed intensive lifestyle modifications today with an emphasis on specific weight loss instructions and strategies. Pt was also informed of the importance of increasing exercise and decreasing saturated fats to help prevent heart disease.  Constipation Daniel [name] was  informed decrease bowel movement frequency is normal while losing weight, but stools should not be hard or painful. He was advised to increase his H20 intake and work on increasing his fiber intake. High fiber foods were discussed today. Daniel [name] agrees to start miralax 1 capful OTC as needed and he agrees to follow up with our clinic in 3 weeks.  Obesity Daniel [name] is currently in the action stage of change. As such, his goal is to continue with weight loss efforts He has agreed to follow the Pescatarian eating plan Daniel [name] has been instructed to work up to a goal of 150 minutes of combined cardio and strengthening exercise per week for weight loss and overall health benefits. We discussed the following Behavioral Modification Strategies today: increasing lean protein intake and work on meal planning and easy cooking plans   Daniel [name] has agreed to follow up with our clinic in 3 weeks. He was informed of the importance of frequent follow up visits to maximize his success with intensive lifestyle modifications for his multiple health conditions.   Daniel McburneyI, Sharon Martin, am acting as transcriptionist for Illa LevelSahar Caitlynne Harbeck, PA-C I, Illa LevelSahar Donell Tomkins [facility], have reviewed this note and agree with its content

## 2018-03-31 ENCOUNTER — Ambulatory Visit (INDEPENDENT_AMBULATORY_CARE_PROVIDER_SITE_OTHER): Payer: 59 | Admitting: Physician Assistant

## 2018-03-31 ENCOUNTER — Encounter (INDEPENDENT_AMBULATORY_CARE_PROVIDER_SITE_OTHER): Payer: Self-pay | Admitting: Physician Assistant

## 2018-03-31 VITALS — BP 119/78 | HR 68 | Temp 98.3°F | Ht 68.0 in | Wt 282.0 lb

## 2018-03-31 DIAGNOSIS — F3289 Other specified depressive episodes: Secondary | ICD-10-CM | POA: Diagnosis not present

## 2018-03-31 DIAGNOSIS — I10 Essential (primary) hypertension: Secondary | ICD-10-CM

## 2018-03-31 DIAGNOSIS — Z9189 Other specified personal risk factors, not elsewhere classified: Secondary | ICD-10-CM

## 2018-03-31 DIAGNOSIS — E559 Vitamin D deficiency, unspecified: Secondary | ICD-10-CM

## 2018-03-31 DIAGNOSIS — Z6841 Body Mass Index (BMI) 40.0 and over, adult: Secondary | ICD-10-CM

## 2018-03-31 MED ORDER — DILTIAZEM HCL ER COATED BEADS 240 MG PO TB24: 240.0000 mg | ORAL_TABLET | Freq: Every day | ORAL | 0 refills | Status: DC

## 2018-03-31 MED ORDER — VITAMIN D (ERGOCALCIFEROL) 1.25 MG (50000 UNIT) PO CAPS: 50000.0000 [IU] | ORAL_CAPSULE | ORAL | 0 refills | Status: DC

## 2018-03-31 MED ORDER — CHLORTHALIDONE 25 MG PO TABS: 25.0000 mg | ORAL_TABLET | Freq: Every day | ORAL | 0 refills | Status: DC

## 2018-03-31 MED ORDER — BUPROPION HCL ER (SR) 150 MG PO TB12: 150.0000 mg | ORAL_TABLET | Freq: Every day | ORAL | 0 refills | Status: DC

## 2018-03-31 MED FILL — DILTIAZEM ER 240 MG TABLET: 240 | 30 days supply | Qty: 30 | Fill #0

## 2018-03-31 MED FILL — VIT D2 1.25 MG (50,000 UNIT: 1.25 MG | 28 days supply | Qty: 4 | Fill #0

## 2018-03-31 MED FILL — BUPROPION SR 150 MG TABLET: 150 | 30 days supply | Qty: 30 | Fill #0

## 2018-03-31 NOTE — Progress Notes (Signed)
Office: [phone]  /  Fax: [phone]   HPI:   Chief Complaint: OBESITY Daniel [name] is here to discuss his progress with his obesity treatment plan. He is on the Pescatarian eating plan and is following his eating plan approximately 40 % of the time. He states he is doing cardio and weights for 60 minutes 7 times per week. Daniel [name] continues to do well with weight loss. He enjoyed the pescatarian meal plan. He is very pleased with his weight loss up to date. His weight is 282 lb (127.9 kg) today and has had a weight loss of 2 pounds over a period of 3 weeks since his last visit. He has lost 27 lbs since starting treatment with Daniel [name].  Vitamin D deficiency Daniel [name] has a diagnosis of vitamin D deficiency. He is currently taking vit D and denies nausea, vomiting or muscle weakness.  Hypertension Daniel [name] is a 47 y.o. male with hypertension. Daniel [name] has been drinking less caffeine, which could be contributing to the improvement in his blood pressure. Daniel [name] denies chest pain or shortness of breath on exertion. He is working weight loss to help control his blood pressure with the goal of decreasing his risk of heart attack and stroke. Daniel [name] blood pressure is stable.  At risk for cardiovascular disease [name] is at a higher than average risk for cardiovascular disease due to obesity and hypertension. He currently denies any chest pain.  Depression with emotional eating behaviors Daniel [name] is struggling with emotional eating and using food for comfort to the extent that it is negatively impacting his health. He often snacks when he is not hungry. Daniel [name] sometimes feels he is out of control and then feels guilty that he made poor food choices. He has been working on behavior modification techniques to help reduce his emotional eating and has been somewhat successful. His mood is stable and he shows no sign of suicidal or homicidal ideations.  Depression screen [name] 2/9 12/24/2016  Decreased  Interest 0  Down, Depressed, Hopeless 0  PHQ - 2 Score 0  Altered sleeping 0  Tired, decreased energy 1  Change in appetite 0  Feeling bad or failure about yourself  0  Trouble concentrating 1  Moving slowly or fidgety/restless 0  Suicidal thoughts 0  PHQ-9 Score 2         ALLERGIES: Allergies  Allergen Reactions  . Bee Venom Anaphylaxis  . Shellfish Allergy Anaphylaxis    MEDICATIONS: Current Outpatient Medications on File Prior to Visit  Medication Sig Dispense Refill  . albuterol (VENTOLIN HFA) 108 (90 Base) MCG/ACT inhaler INHALE 2 PUFFS INTO THE LUNGS EVERY 4 HOURS AS NEEDED FOR WHEEZING OR SHORTNESS OF BREATH. 18 g 5  . EPINEPHrine (EPIPEN 2-PAK) 0.3 mg/0.3 mL SOAJ injection Inject 0.3 mLs (0.3 mg total) into the muscle once. 1 Device 11  . levocetirizine (XYZAL) 5 MG tablet TAKE 1 TABLET BY MOUTH EVERY EVENING. 90 tablet 0  . montelukast (SINGULAIR) 10 MG tablet TAKE 1 TABLET (10 MG TOTAL) BY MOUTH AT BEDTIME. 90 tablet 0  . Multiple Vitamin (MULTIVITAMIN WITH MINERALS) TABS Take 1 tablet by mouth daily.    [name] omeprazole (PRILOSEC) 20 MG capsule TAKE 1 CAPSULE BY MOUTH ONCE DAILY 90 capsule 3  . polyethylene glycol powder (GLYCOLAX/MIRALAX) powder Take 17 g by mouth daily as needed. 3350 g 1  . Polyvinyl Alcohol-Povidone (CLEAR EYES ALL SEASONS) 5-6 MG/ML SOLN Apply 1 drop to eye once as needed. Reported on 02/01/2016  No current facility-administered medications on file prior to visit.     PAST MEDICAL HISTORY: Past Medical History:  Diagnosis Date  . Asthma   . GERD (gastroesophageal reflux disease)   . Hypertension     PAST SURGICAL HISTORY: No past surgical history on file.  SOCIAL HISTORY: Social History   Tobacco Use  . Smoking status: Never Smoker  . Smokeless tobacco: Never Used  Substance Use Topics  . Alcohol use: No    Alcohol/week: 0.0 oz  . Drug use: No    FAMILY HISTORY: Family History  Problem Relation Age of Onset  .  Hyperlipidemia Unknown   . Hypertension Unknown   . Stroke Unknown   . Hypertension Mother   . Hyperlipidemia Mother   . Obesity Mother   . Sudden death Father     ROS: Review of Systems  Constitutional: Positive for weight loss.  Respiratory: Negative for shortness of breath (on exertion).   Cardiovascular: Negative for chest pain.  Gastrointestinal: Negative for nausea and vomiting.  Musculoskeletal:       Negative for muscle weakness  Psychiatric/Behavioral: Positive for depression. Negative for suicidal ideas.    PHYSICAL EXAM: Blood pressure 119/78, pulse 68, temperature 98.3 F (36.8 C), temperature source Oral, height  (1.727 m), weight 282 lb (127.9 kg), SpO2 99 %. Body mass index is 42.88 kg/m. Physical Exam  Constitutional: He is oriented to person, place, and time. He appears well-developed and well-nourished.  Cardiovascular: Normal rate.  Pulmonary/Chest: Effort normal.  Musculoskeletal: Normal range of motion.  Neurological: He is oriented to person, place, and time.  Skin: Skin is warm and dry.  Psychiatric: He has a normal mood and affect. His behavior is normal.  Vitals reviewed.   RECENT LABS AND TESTS: BMET    Component Value Date/Time   NA 140 11/11/2017 1008   K 4.5 11/11/2017 1008   CL 104 11/11/2017 1008   CO2 24 11/11/2017 1008   GLUCOSE 80 11/11/2017 1008   GLUCOSE 103 (H) 11/07/2015 1454   BUN 16 11/11/2017 1008   CREATININE 1.28 (H) 11/11/2017 1008   CALCIUM 9.4 11/11/2017 1008   GFRNONAA 68 11/11/2017 1008   GFRAA 78 11/11/2017 1008   Lab Results  Component Value Date   HGBA1C 5.4 11/11/2017   HGBA1C 5.3 08/06/2017   HGBA1C 5.6 04/08/2017   HGBA1C 5.5 12/24/2016   Lab Results  Component Value Date   INSULIN 23.2 11/11/2017   INSULIN 27.6 (H) 08/06/2017   INSULIN 28.9 (H) 04/08/2017   INSULIN 25.8 (H) 12/24/2016   CBC    Component Value Date/Time   WBC 6.6 12/24/2016 1021   WBC 10.2 11/07/2015 1454   RBC 5.47  12/24/2016 1021   RBC 5.67 11/07/2015 1454   HGB 15.2 12/24/2016 1021   HCT 44.3 12/24/2016 1021   PLT 300.0 11/07/2015 1454   MCV 81 12/24/2016 1021   MCH 27.8 12/24/2016 1021   MCH 28.0 07/18/2012 2030   MCHC 34.3 12/24/2016 1021   MCHC 33.3 11/07/2015 1454   RDW 14.6 12/24/2016 1021   LYMPHSABS 2.7 12/24/2016 1021   MONOABS 0.9 11/07/2015 1454   EOSABS 0.2 12/24/2016 1021   BASOSABS 0.1 12/24/2016 1021   Iron/TIBC/Ferritin/ %Sat No results found for: IRON, TIBC, FERRITIN, IRONPCTSAT Lipid Panel     Component Value Date/Time   CHOL 182 08/06/2017 1102   TRIG 73 08/06/2017 1102   HDL 39 (L) 08/06/2017 1102   CHOLHDL 5 03/17/2015 0841   VLDL 22.8  03/17/2015 0841   LDLCALC 128 (H) 08/06/2017 1102   Hepatic Function Panel     Component Value Date/Time   PROT 7.6 11/11/2017 1008   ALBUMIN 4.3 11/11/2017 1008   AST 16 11/11/2017 1008   ALT 26 11/11/2017 1008   ALKPHOS 62 11/11/2017 1008   BILITOT 0.3 11/11/2017 1008   BILIDIR 0.1 11/07/2015 1454      Component Value Date/Time   TSH 1.370 12/24/2016 1021   TSH 1.39 03/17/2015 0841   TSH 1.58 03/02/2014 0911   Results for IRAN, ROWE (MRN [id number]) as of 03/31/2018 16:25  Ref. Range 11/11/2017 10:08  Vitamin D, 25-Hydroxy Latest Ref Range: 30.0 - 100.0 ng/mL 43.4   ASSESSMENT AND PLAN: Essential hypertension - Plan: diltiazem (CARDIZEM LA) 240 MG 24 hr tablet, chlorthalidone (HYGROTON) 25 MG tablet  Vitamin D deficiency - Plan: Vitamin D, Ergocalciferol, (DRISDOL) 50000 units CAPS capsule  Other depression - with emotional eating - Plan: buPROPion (WELLBUTRIN SR) 150 MG 12 hr tablet  At risk for heart disease  Class 3 severe obesity with serious comorbidity and body mass index (BMI) of 40.0 to 44.9 in adult, unspecified obesity type (HCC)  PLAN:  Vitamin D Deficiency Burnette was informed that low vitamin D levels contributes to fatigue and are associated with obesity, breast, and colon cancer. He agrees to  continue to take prescription Vit D ,000 IU every week #4 with no refills and will follow up for routine testing of vitamin D, at least 2-3 times per year. He was informed of the risk of over-replacement of vitamin D and agrees to not increase his dose unless he discusses this with Daniel [name] first. Saeed agrees to follow up with our clinic in 3 weeks.  Hypertension We discussed sodium restriction, working on healthy weight loss, and a regular exercise program as the means to achieve improved blood pressure control. Eligio agreed with this plan and agreed to follow up as directed. We will continue to monitor his blood pressure as well as his progress with the above lifestyle modifications. He agrees to continue Diltiazem 240 mg daily #30 with no refills, Chlorthalidone 25 mg qd #30 with no refills and will watch for signs of hypotension as he continues his lifestyle modifications.  Cardiovascular risk counseling Chelsey was given extended (15 minutes) coronary artery disease prevention counseling today. He is 47 y.o. male and has risk factors for heart disease including obesity and hypertension. We discussed intensive lifestyle modifications today with an emphasis on specific weight loss instructions and strategies. Pt was also informed of the importance of increasing exercise and decreasing saturated fats to help prevent heart disease.  Depression with Emotional Eating Behaviors We discussed behavior modification techniques today to help Jahsiah deal with his emotional eating and depression. He has agreed to continue Wellbutrin SR 150 mg qd #30 with no refills and follow up as directed.  Obesity Jeramie is currently in the action stage of change. As such, his goal is to continue with weight loss efforts He has agreed to follow the Pescatarian eating plan Taysom has been instructed to work up to a goal of 150 minutes of combined cardio and strengthening exercise per week for weight loss and overall health  benefits. We discussed the following Behavioral Modification Strategies today: increasing lean protein intake and work on meal planning and easy cooking plans  Abhiram has agreed to follow up with our clinic in 3 weeks. He was informed of the importance of frequent follow up visits to maximize  his success with intensive lifestyle modifications for his multiple health conditions.  Cristi Loron, am acting as transcriptionist for [employer], PA-C I, Illa Level [facility], have reviewed this note and agree with its content

## 2018-04-03 MED FILL — CHLORTHALIDONE 25 MG TAB: 25 | 30 days supply | Qty: 30 | Fill #0

## 2018-04-22 ENCOUNTER — Ambulatory Visit (INDEPENDENT_AMBULATORY_CARE_PROVIDER_SITE_OTHER): Payer: 59 | Admitting: Physician Assistant

## 2018-04-22 ENCOUNTER — Encounter (INDEPENDENT_AMBULATORY_CARE_PROVIDER_SITE_OTHER): Payer: Self-pay | Admitting: Physician Assistant

## 2018-04-22 VITALS — BP 121/78 | HR 63 | Temp 97.8°F | Ht 68.0 in | Wt 280.0 lb

## 2018-04-22 DIAGNOSIS — Z6841 Body Mass Index (BMI) 40.0 and over, adult: Secondary | ICD-10-CM

## 2018-04-22 DIAGNOSIS — Z9189 Other specified personal risk factors, not elsewhere classified: Secondary | ICD-10-CM | POA: Diagnosis not present

## 2018-04-22 DIAGNOSIS — E559 Vitamin D deficiency, unspecified: Secondary | ICD-10-CM

## 2018-04-22 DIAGNOSIS — I10 Essential (primary) hypertension: Secondary | ICD-10-CM | POA: Diagnosis not present

## 2018-04-22 MED ORDER — VITAMIN D (ERGOCALCIFEROL) 1.25 MG (50000 UNIT) PO CAPS: 50000.0000 [IU] | ORAL_CAPSULE | ORAL | 0 refills | Status: DC

## 2018-04-22 MED ORDER — DILTIAZEM HCL ER COATED BEADS 240 MG PO TB24: 240.0000 mg | ORAL_TABLET | Freq: Every day | ORAL | 0 refills | Status: DC

## 2018-04-22 MED FILL — VIT D2 1.25 MG (50,000 UNIT: 1.25 MG | 28 days supply | Qty: 4 | Fill #0

## 2018-04-22 NOTE — Progress Notes (Signed)
Office: [phone]  /  Fax: [phone]   HPI:   Chief Complaint: OBESITY Lea is here to discuss his progress with his obesity treatment plan. He is on the Pescatarian eating plan and is following his eating plan approximately 80 % of the time. He states he is doing cardio and weights for 60 minutes 5 times per week. Kairon continues to do well with weight loss. He states he is not following the Pescatarian meal plan but using it as a guide. He chooses high protein food options and limits his carbohydrate intake.  His weight is 280 lb (127 kg) today and has had a weight loss of 2 pounds over a period of 3 weeks since his last visit. He has lost 29 lbs since starting treatment with [name].  Hypertension ROXANNE ORNER is a 47 y.o. male with hypertension. Jamarri's blood pressure is stable and he denies chest pain or shortness of breath. He is working weight loss to help control his blood pressure with the goal of decreasing his risk of heart attack and stroke. Orion's blood pressure is currently controlled.  At risk for cardiovascular disease Danon is at a higher than average risk for cardiovascular disease due to obesity and hypertension. He currently denies any chest pain.  Vitamin D Deficiency Ules has a diagnosis of vitamin D deficiency. He is currently taking prescription Vit D and denies nausea, vomiting or muscle weakness.  ALLERGIES: Allergies  Allergen Reactions  . Bee Venom Anaphylaxis  . Shellfish Allergy Anaphylaxis    MEDICATIONS: Current Outpatient Medications on File Prior to Visit  Medication Sig Dispense Refill  . albuterol (VENTOLIN HFA) 108 (90 Base) MCG/ACT inhaler INHALE 2 PUFFS INTO THE LUNGS EVERY 4 HOURS AS NEEDED FOR WHEEZING OR SHORTNESS OF BREATH. 18 g 5  . buPROPion (WELLBUTRIN SR) 150 MG 12 hr tablet Take 1 tablet (150 mg total) by mouth daily. 30 tablet 0  . chlorthalidone (HYGROTON) 25 MG tablet Take 1 tablet (25 mg total) by mouth daily. 30  tablet 0  . EPINEPHrine (EPIPEN 2-PAK) 0.3 mg/0.3 mL SOAJ injection Inject 0.3 mLs (0.3 mg total) into the muscle once. 1 Device 11  . levocetirizine (XYZAL) 5 MG tablet TAKE 1 TABLET BY MOUTH EVERY EVENING. 90 tablet 0  . montelukast (SINGULAIR) 10 MG tablet TAKE 1 TABLET (10 MG TOTAL) BY MOUTH AT BEDTIME. 90 tablet 0  . Multiple Vitamin (MULTIVITAMIN WITH MINERALS) TABS Take 1 tablet by mouth daily.    [name] omeprazole (PRILOSEC) 20 MG capsule TAKE 1 CAPSULE BY MOUTH ONCE DAILY 90 capsule 3  . polyethylene glycol powder (GLYCOLAX/MIRALAX) powder Take 17 g by mouth daily as needed. 3350 g 1  . Polyvinyl Alcohol-Povidone (CLEAR EYES ALL SEASONS) 5-6 MG/ML SOLN Apply 1 drop to eye once as needed. Reported on 02/01/2016     No current facility-administered medications on file prior to visit.     PAST MEDICAL HISTORY: Past Medical History:  Diagnosis Date  . Asthma   . GERD (gastroesophageal reflux disease)   . Hypertension     PAST SURGICAL HISTORY: No past surgical history on file.  SOCIAL HISTORY: Social History   Tobacco Use  . Smoking status: Never Smoker  . Smokeless tobacco: Never Used  Substance Use Topics  . Alcohol use: No    Alcohol/week: 0.0 oz  . Drug use: No    FAMILY HISTORY: Family History  Problem Relation Age of Onset  . Hyperlipidemia Unknown   . Hypertension Unknown   .  Stroke Unknown   . Hypertension Mother   . Hyperlipidemia Mother   . Obesity Mother   . Sudden death Father     ROS: Review of Systems  Constitutional: Positive for weight loss.  Respiratory: Negative for shortness of breath.   Cardiovascular: Negative for chest pain.  Gastrointestinal: Negative for nausea and vomiting.  Musculoskeletal:       Negative muscle weakness    PHYSICAL EXAM: Blood pressure 121/78, pulse 63, temperature 97.8 F (36.6 C), temperature source Oral, height  (1.727 m), weight 280 lb (127 kg), SpO2 97 %. Body mass index is 42.57 kg/m. Physical Exam    Constitutional: He is oriented to person, place, and time. He appears well-developed and well-nourished.  Cardiovascular: Normal rate.  Pulmonary/Chest: Effort normal.  Musculoskeletal: Normal range of motion.  Neurological: He is oriented to person, place, and time.  Skin: Skin is warm and dry.  Psychiatric: He has a normal mood and affect. His behavior is normal.  Vitals reviewed.   RECENT LABS AND TESTS: BMET    Component Value Date/Time   NA 140 11/11/2017 1008   K 4.5 11/11/2017 1008   CL 104 11/11/2017 1008   CO2 24 11/11/2017 1008   GLUCOSE 80 11/11/2017 1008   GLUCOSE 103 (H) 11/07/2015 1454   BUN 16 11/11/2017 1008   CREATININE 1.28 (H) 11/11/2017 1008   CALCIUM 9.4 11/11/2017 1008   GFRNONAA 68 11/11/2017 1008   GFRAA 78 11/11/2017 1008   Lab Results  Component Value Date   HGBA1C 5.4 11/11/2017   HGBA1C 5.3 08/06/2017   HGBA1C 5.6 04/08/2017   HGBA1C 5.5 12/24/2016   Lab Results  Component Value Date   INSULIN 23.2 11/11/2017   INSULIN 27.6 (H) 08/06/2017   INSULIN 28.9 (H) 04/08/2017   INSULIN 25.8 (H) 12/24/2016   CBC    Component Value Date/Time   WBC 6.6 12/24/2016 1021   WBC 10.2 11/07/2015 1454   RBC 5.47 12/24/2016 1021   RBC 5.67 11/07/2015 1454   HGB 15.2 12/24/2016 1021   HCT 44.3 12/24/2016 1021   PLT 300.0 11/07/2015 1454   MCV 81 12/24/2016 1021   MCH 27.8 12/24/2016 1021   MCH 28.0 07/18/2012 2030   MCHC 34.3 12/24/2016 1021   MCHC 33.3 11/07/2015 1454   RDW 14.6 12/24/2016 1021   LYMPHSABS 2.7 12/24/2016 1021   MONOABS 0.9 11/07/2015 1454   EOSABS 0.2 12/24/2016 1021   BASOSABS 0.1 12/24/2016 1021   Iron/TIBC/Ferritin/ %Sat No results found for: IRON, TIBC, FERRITIN, IRONPCTSAT Lipid Panel     Component Value Date/Time   CHOL 182 08/06/2017 1102   TRIG 73 08/06/2017 1102   HDL 39 (L) 08/06/2017 1102   CHOLHDL 5 03/17/2015 0841   VLDL 22.8 03/17/2015 0841   LDLCALC 128 (H) 08/06/2017 1102   Hepatic Function Panel      Component Value Date/Time   PROT 7.6 11/11/2017 1008   ALBUMIN 4.3 11/11/2017 1008   AST 16 11/11/2017 1008   ALT 26 11/11/2017 1008   ALKPHOS 62 11/11/2017 1008   BILITOT 0.3 11/11/2017 1008   BILIDIR 0.1 11/07/2015 1454      Component Value Date/Time   TSH 1.370 12/24/2016 1021   TSH 1.39 03/17/2015 0841   TSH 1.58 03/02/2014 0911  Results for TREA, LATNER (MRN [id number]) as of 04/22/2018 11:36  Ref. Range 11/11/2017 10:08  Vitamin D, 25-Hydroxy Latest Ref Range: 30.0 - 100.0 ng/mL 43.4    ASSESSMENT AND PLAN: Essential hypertension -  Plan: diltiazem (CARDIZEM LA) 240 MG 24 hr tablet  Vitamin D deficiency - Plan: Vitamin D, Ergocalciferol, (DRISDOL) 50000 units CAPS capsule  At risk for heart disease  Class 3 severe obesity with serious comorbidity and body mass index (BMI) of 40.0 to 44.9 in adult, unspecified obesity type (HCC)  PLAN:  Hypertension We discussed sodium restriction, working on healthy weight loss, and a regular exercise program as the means to achieve improved blood pressure control. Azrael agreed with this plan and agreed to follow up as directed. We will continue to monitor his blood pressure as well as his progress with the above lifestyle modifications. Willow agrees to continue taking Diltiazem 240 mg qd #30 and we will refill for 1 month. He will watch for signs of hypotension as he continues his lifestyle modifications. Elijah agrees to follow up with our clinic in 3 weeks.  Cardiovascular risk counselling Arliss was given extended (15 minutes) coronary artery disease prevention counseling today. He is 47 y.o. male and has risk factors for heart disease including obesity and hypertension. We discussed intensive lifestyle modifications today with an emphasis on specific weight loss instructions and strategies. Pt was also informed of the importance of increasing exercise and decreasing saturated fats to help prevent heart disease.  Vitamin D  Deficiency Moshe was informed that low vitamin D levels contributes to fatigue and are associated with obesity, breast, and colon cancer. Naftuli agrees to continue taking prescription Vit D ,000 IU every week #4 and we will refill for 1 month. He will follow up for routine testing of vitamin D, at least 2-3 times per year. He was informed of the risk of over-replacement of vitamin D and agrees to not increase his dose unless he discusses this with [name] first. Aarav agrees to follow up with our clinic in 3 weeks.  Obesity Lakoda is currently in the action stage of change. As such, his goal is to continue with weight loss efforts He has agreed to follow the Pescatarian eating plan Agustin has been instructed to work up to a goal of 150 minutes of combined cardio and strengthening exercise per week for weight loss and overall health benefits. We discussed the following Behavioral Modification Strategies today: increasing lean protein intake and work on meal planning and easy cooking plans   Abraham has agreed to follow up with our clinic in 3 weeks. He was informed of the importance of frequent follow up visits to maximize his success with intensive lifestyle modifications for his multiple health conditions.   Trude Mcburney, am acting as transcriptionist for Illa Level, PA-C I, Illa Level [facility], have reviewed this note and agree with its content

## 2018-04-24 MED FILL — DILTIAZEM ER 240 MG TABLET: 240 | 30 days supply | Qty: 30 | Fill #0

## 2018-04-30 ENCOUNTER — Other Ambulatory Visit: Payer: Self-pay | Admitting: Family Medicine

## 2018-05-01 MED FILL — LEVOCETIRIZINE 5 MG TABLET: 5 | 90 days supply | Qty: 90 | Fill #0

## 2018-05-05 ENCOUNTER — Other Ambulatory Visit (INDEPENDENT_AMBULATORY_CARE_PROVIDER_SITE_OTHER): Payer: Self-pay | Admitting: Physician Assistant

## 2018-05-05 DIAGNOSIS — F3289 Other specified depressive episodes: Secondary | ICD-10-CM

## 2018-05-06 ENCOUNTER — Other Ambulatory Visit: Payer: Self-pay | Admitting: Family Medicine

## 2018-05-07 ENCOUNTER — Other Ambulatory Visit (INDEPENDENT_AMBULATORY_CARE_PROVIDER_SITE_OTHER): Payer: Self-pay | Admitting: Physician Assistant

## 2018-05-07 DIAGNOSIS — F3289 Other specified depressive episodes: Secondary | ICD-10-CM

## 2018-05-07 MED FILL — MONTELUKAST SOD 10 MG TAB: 10 | 90 days supply | Qty: 90 | Fill #0

## 2018-05-07 MED FILL — BUPROPION SR 150 MG TABLET: 150 | 30 days supply | Qty: 30 | Fill #0

## 2018-05-13 ENCOUNTER — Ambulatory Visit (INDEPENDENT_AMBULATORY_CARE_PROVIDER_SITE_OTHER): Payer: 59 | Admitting: Physician Assistant

## 2018-05-13 VITALS — BP 113/70 | HR 63 | Temp 98.1°F | Ht 68.0 in | Wt 276.0 lb

## 2018-05-13 DIAGNOSIS — I10 Essential (primary) hypertension: Secondary | ICD-10-CM | POA: Diagnosis not present

## 2018-05-13 DIAGNOSIS — Z6841 Body Mass Index (BMI) 40.0 and over, adult: Secondary | ICD-10-CM

## 2018-05-13 DIAGNOSIS — E66813 Obesity, class 3: Secondary | ICD-10-CM

## 2018-05-13 DIAGNOSIS — E559 Vitamin D deficiency, unspecified: Secondary | ICD-10-CM

## 2018-05-13 DIAGNOSIS — Z9189 Other specified personal risk factors, not elsewhere classified: Secondary | ICD-10-CM

## 2018-05-13 MED ORDER — VITAMIN D (ERGOCALCIFEROL) 1.25 MG (50000 UNIT) PO CAPS: 50000.0000 [IU] | ORAL_CAPSULE | ORAL | 0 refills | Status: DC

## 2018-05-13 MED ORDER — CHLORTHALIDONE 25 MG PO TABS: 25.0000 mg | ORAL_TABLET | Freq: Every day | ORAL | 0 refills | Status: DC

## 2018-05-13 MED FILL — CHLORTHALIDONE 25 MG TAB: 25 | 30 days supply | Qty: 30 | Fill #0

## 2018-05-13 NOTE — Progress Notes (Signed)
Office: [phone]  /  Fax: [phone]   HPI:   Chief Complaint: OBESITY Daniel [name] is here to discuss his progress with his obesity treatment plan. He is on the Pescatarian eating plan and is following his eating plan approximately 50 % of the time. He states he is doing cardio and weight lifting for 60 minutes 5 times per week. Daniel [name] continues to do well with weight loss. He is not following the structured meal plan, however he makes mindful food choices and controls his portions. He requests a longer follow up, as he will be on vacation. His weight is 276 lb (125.2 kg) today and has had a weight loss of 4 pounds over a period of 3 weeks since his last visit. He has lost 33 lbs since starting treatment with Daniel [name].  Vitamin D deficiency Daniel [name] has a diagnosis of vitamin D deficiency. He is currently taking vit D and denies nausea, vomiting or muscle weakness.  Hypertension Daniel [name] is a 47 y.o. male with hypertension. Daniel [name] denies chest pain or shortness of breath on exertion. He is working weight loss to help control his blood pressure with the goal of decreasing his risk of heart attack and stroke. Daniel [name] blood pressure is stable.  At risk for cardiovascular disease Daniel [name] is at a higher than average risk for cardiovascular disease due to obesity and hypertension. He currently denies any chest pain.  ALLERGIES: Allergies  Allergen Reactions  . Bee Venom Anaphylaxis  . Shellfish Allergy Anaphylaxis    MEDICATIONS: Current Outpatient Medications on File Prior to Visit  Medication Sig Dispense Refill  . albuterol (VENTOLIN HFA) 108 (90 Base) MCG/ACT inhaler INHALE 2 PUFFS INTO THE LUNGS EVERY 4 HOURS AS NEEDED FOR WHEEZING OR SHORTNESS OF BREATH. 18 g 5  . buPROPion (WELLBUTRIN SR) 150 MG 12 hr tablet TAKE 1 TABLET (150 MG TOTAL) BY MOUTH DAILY. 30 tablet 0  . diltiazem (CARDIZEM LA) 240 MG 24 hr tablet Take 1 tablet (240 mg total) by mouth daily. 30 tablet 0  .  EPINEPHrine (EPIPEN 2-PAK) 0.3 mg/0.3 mL SOAJ injection Inject 0.3 mLs (0.3 mg total) into the muscle once. 1 Device 11  . levocetirizine (XYZAL) 5 MG tablet TAKE 1 TABLET BY MOUTH EVERY EVENING. 90 tablet 3  . montelukast (SINGULAIR) 10 MG tablet TAKE 1 TABLET (10 MG TOTAL) BY MOUTH AT BEDTIME. 90 tablet 3  . Multiple Vitamin (MULTIVITAMIN WITH MINERALS) TABS Take 1 tablet by mouth daily.    [name]. omeprazole (PRILOSEC) 20 MG capsule TAKE 1 CAPSULE BY MOUTH ONCE DAILY 90 capsule 3  . polyethylene glycol powder (GLYCOLAX/MIRALAX) powder Take 17 g by mouth daily as needed. 3350 g 1  . Polyvinyl Alcohol-Povidone (CLEAR EYES ALL SEASONS) 5-6 MG/ML SOLN Apply 1 drop to eye once as needed. Reported on 02/01/2016     No current facility-administered medications on file prior to visit.     PAST MEDICAL HISTORY: Past Medical History:  Diagnosis Date  . Asthma   . GERD (gastroesophageal reflux disease)   . Hypertension     PAST SURGICAL HISTORY: No past surgical history on file.  SOCIAL HISTORY: Social History   Tobacco Use  . Smoking status: Never Smoker  . Smokeless tobacco: Never Used  Substance Use Topics  . Alcohol use: No    Alcohol/week: 0.0 oz  . Drug use: No    FAMILY HISTORY: Family History  Problem Relation Age of Onset  . Hyperlipidemia Unknown   . Hypertension Unknown   .  Stroke Unknown   . Hypertension Mother   . Hyperlipidemia Mother   . Obesity Mother   . Sudden death Father     ROS: Review of Systems  Constitutional: Positive for weight loss.  Respiratory: Negative for shortness of breath (on exertion).   Cardiovascular: Negative for chest pain.  Gastrointestinal: Negative for nausea and vomiting.  Musculoskeletal:       Negative for muscle weakness    PHYSICAL EXAM: Blood pressure 113/70, pulse 63, temperature 98.1 F (36.7 C), temperature source Oral, height 5\' 8"  (1.727 m), weight 276 lb (125.2 kg), SpO2 97 %. Body mass index is 41.97 kg/m. Physical  Exam  Constitutional: He is oriented to person, place, and time. He appears well-developed and well-nourished.  Cardiovascular: Normal rate.  Pulmonary/Chest: Effort normal.  Musculoskeletal: Normal range of motion.  Neurological: He is oriented to person, place, and time.  Skin: Skin is warm and dry.  Psychiatric: He has a normal mood and affect. His behavior is normal.  Vitals reviewed.   RECENT LABS AND TESTS: BMET    Component Value Date/Time   NA 140 11/11/2017 1008   K 4.5 11/11/2017 1008   CL 104 11/11/2017 1008   CO2 24 11/11/2017 1008   GLUCOSE 80 11/11/2017 1008   GLUCOSE 103 (H) 11/07/2015 1454   BUN 16 11/11/2017 1008   CREATININE 1.28 (H) 11/11/2017 1008   CALCIUM 9.4 11/11/2017 1008   GFRNONAA 68 11/11/2017 1008   GFRAA 78 11/11/2017 1008   Lab Results  Component Value Date   HGBA1C 5.4 11/11/2017   HGBA1C 5.3 08/06/2017   HGBA1C 5.6 04/08/2017   HGBA1C 5.5 12/24/2016   Lab Results  Component Value Date   INSULIN 23.2 11/11/2017   INSULIN 27.6 (H) 08/06/2017   INSULIN 28.9 (H) 04/08/2017   INSULIN 25.8 (H) 12/24/2016   CBC    Component Value Date/Time   WBC 6.6 12/24/2016 1021   WBC 10.2 11/07/2015 1454   RBC 5.47 12/24/2016 1021   RBC 5.67 11/07/2015 1454   HGB 15.2 12/24/2016 1021   HCT 44.3 12/24/2016 1021   PLT 300.0 11/07/2015 1454   MCV 81 12/24/2016 1021   MCH 27.8 12/24/2016 1021   MCH 28.0 07/18/2012 2030   MCHC 34.3 12/24/2016 1021   MCHC 33.3 11/07/2015 1454   RDW 14.6 12/24/2016 1021   LYMPHSABS 2.7 12/24/2016 1021   MONOABS 0.9 11/07/2015 1454   EOSABS 0.2 12/24/2016 1021   BASOSABS 0.1 12/24/2016 1021   Iron/TIBC/Ferritin/ %Sat No results found for: IRON, TIBC, FERRITIN, IRONPCTSAT Lipid Panel     Component Value Date/Time   CHOL 182 08/06/2017 1102   TRIG 73 08/06/2017 1102   HDL 39 (L) 08/06/2017 1102   CHOLHDL 5 03/17/2015 0841   VLDL 22.8 03/17/2015 0841   LDLCALC 128 (H) 08/06/2017 1102   Hepatic Function Panel      Component Value Date/Time   PROT 7.6 11/11/2017 1008   ALBUMIN 4.3 11/11/2017 1008   AST 16 11/11/2017 1008   ALT 26 11/11/2017 1008   ALKPHOS 62 11/11/2017 1008   BILITOT 0.3 11/11/2017 1008   BILIDIR 0.1 11/07/2015 1454      Component Value Date/Time   TSH 1.370 12/24/2016 1021   TSH 1.39 03/17/2015 0841   TSH 1.58 03/02/2014 0911   Results for LATERRANCE, NAUTA (MRN [id number]) as of 05/13/2018 15:56  Ref. Range 11/11/2017 10:08  Vitamin D, 25-Hydroxy Latest Ref Range: 30.0 - 100.0 ng/mL 43.4   ASSESSMENT AND PLAN:  Vitamin D deficiency - Plan: Vitamin D, Ergocalciferol, (DRISDOL) 50000 units CAPS capsule  Essential hypertension - Plan: chlorthalidone (HYGROTON) 25 MG tablet  At risk for heart disease  Class 3 severe obesity with serious comorbidity and body mass index (BMI) of 40.0 to 44.9 in adult, unspecified obesity type (HCC)  PLAN:  Vitamin D Deficiency Daniel [name] was informed that low vitamin D levels contributes to fatigue and are associated with obesity, breast, and colon cancer. He agrees to continue to take prescription Vit D @50 ,000 IU every week #4 with no refills and will follow up for routine testing of vitamin D, at least 2-3 times per year. He was informed of the risk of over-replacement of vitamin D and agrees to not increase his dose unless he discusses this with [name] first. Daniel [name] agrees to follow up as directed.  Hypertension We discussed sodium restriction, working on healthy weight loss, and a regular exercise program as the means to achieve improved blood pressure control. Daniel [name] agreed with this plan and agreed to follow up as directed. We will continue to monitor his blood pressure as well as his progress with the above lifestyle modifications. He agrees to continue HCTZ 25 mg qd #30 with no refills and will watch for signs of hypotension as he continues his lifestyle modifications.  Cardiovascular risk counseling Daniel [name] was given extended (15 minutes)  coronary artery disease prevention counseling today. He is 47 y.o. male and has risk factors for heart disease including obesity and hypertension. We discussed intensive lifestyle modifications today with an emphasis on specific weight loss instructions and strategies. Pt was also informed of the importance of increasing exercise and decreasing saturated fats to help prevent heart disease.  Obesity Daniel [name] is currently in the action stage of change. As such, his goal is to continue with weight loss efforts He has agreed to portion control better and make smarter food choices, such as increase vegetables and decrease simple carbohydrates  Daniel [name] has been instructed to work up to a goal of 150 minutes of combined cardio and strengthening exercise per week for weight loss and overall health benefits. We discussed the following Behavioral Modification Strategies today: increasing lean protein intake and planning for success  Daniel [name] has agreed to follow up with our clinic in 4 weeks. He was informed of the importance of frequent follow up visits to maximize his success with intensive lifestyle modifications for his multiple health conditions.  Cristi Loron, am acting as transcriptionist for [employer], PA-C I, Illa Level [facility], have reviewed this note and agree with its content

## 2018-05-14 MED FILL — VIT D2 1.25 MG (50,000 UNIT: 1.25 MG | 28 days supply | Qty: 4 | Fill #0

## 2018-06-10 ENCOUNTER — Encounter (INDEPENDENT_AMBULATORY_CARE_PROVIDER_SITE_OTHER): Payer: Self-pay | Admitting: Physician Assistant

## 2018-06-10 ENCOUNTER — Ambulatory Visit (INDEPENDENT_AMBULATORY_CARE_PROVIDER_SITE_OTHER): Payer: 59 | Admitting: Physician Assistant

## 2018-06-10 VITALS — BP 118/79 | HR 66 | Temp 97.9°F | Ht 68.0 in | Wt 280.0 lb

## 2018-06-10 DIAGNOSIS — Z9189 Other specified personal risk factors, not elsewhere classified: Secondary | ICD-10-CM | POA: Diagnosis not present

## 2018-06-10 DIAGNOSIS — Z6841 Body Mass Index (BMI) 40.0 and over, adult: Secondary | ICD-10-CM | POA: Diagnosis not present

## 2018-06-10 DIAGNOSIS — F3289 Other specified depressive episodes: Secondary | ICD-10-CM

## 2018-06-10 DIAGNOSIS — E559 Vitamin D deficiency, unspecified: Secondary | ICD-10-CM | POA: Diagnosis not present

## 2018-06-10 DIAGNOSIS — I10 Essential (primary) hypertension: Secondary | ICD-10-CM

## 2018-06-10 MED ORDER — VITAMIN D (ERGOCALCIFEROL) 1.25 MG (50000 UNIT) PO CAPS: 50000.0000 [IU] | ORAL_CAPSULE | ORAL | 0 refills | Status: DC

## 2018-06-10 MED ORDER — DILTIAZEM HCL ER COATED BEADS 240 MG PO TB24: 240.0000 mg | ORAL_TABLET | Freq: Every day | ORAL | 0 refills | Status: DC

## 2018-06-10 MED ORDER — BUPROPION HCL ER (SR) 150 MG PO TB12: 150.0000 mg | ORAL_TABLET | Freq: Every day | ORAL | 0 refills | Status: DC

## 2018-06-10 MED ORDER — CHLORTHALIDONE 25 MG PO TABS: 25.0000 mg | ORAL_TABLET | Freq: Every day | ORAL | 0 refills | Status: DC

## 2018-06-10 MED FILL — VIT D2 1.25 MG (50,000 UNIT: 1.25 MG | 28 days supply | Qty: 4 | Fill #0

## 2018-06-10 MED FILL — DILTIAZEM ER 240 MG TABLET: 240 | 30 days supply | Qty: 30 | Fill #0

## 2018-06-10 MED FILL — BUPROPION SR 150 MG TABLET: 150 | 30 days supply | Qty: 30 | Fill #0

## 2018-06-10 MED FILL — CHLORTHALIDONE 25 MG TAB: 25 | 30 days supply | Qty: 30 | Fill #0

## 2018-06-10 NOTE — Progress Notes (Signed)
Office: [phone]  /  Fax: [phone]   HPI:   Chief Complaint: OBESITY Daniel [name] is here to discuss his progress with his obesity treatment plan. He is on the portion control better and make smarter food choices, such as increase vegetables and decrease simple carbohydrates and is following his eating plan approximately 50 % of the time. He states he is doing cardio and weights for 60 minutes 7 times per week. Ralston was on vacation and got off track with his eating. He is motivated to get back on track and continue with weight loss.  His weight is 280 lb (127 kg) today and has gained 4 pounds since his last visit. He has lost 29 lbs since starting treatment with [name].  Hypertension Daniel [name] is a 47 y.o. male with hypertension. Daniel [name] blood pressure is stable and he denies chest pain or shortness of breath. He is working weight loss to help control his blood pressure with the goal of decreasing his risk of heart attack and stroke. Daniel [name] blood pressure is currently controlled.  At risk for cardiovascular disease Daniel [name] is at a higher than average risk for cardiovascular disease due to obesity and hypertension. He currently denies any chest pain.  Vitamin D Deficiency Daniel [name] has a diagnosis of vitamin D deficiency. He is currently taking prescription Vit D and denies nausea, vomiting or muscle weakness.  Depression with emotional eating behaviors Daniel [name] is struggling with emotional eating and using food for comfort to the extent that it is negatively impacting his health. He often snacks when he is not hungry. Daniel [name] sometimes feels he is out of control and then feels guilty that he made poor food choices. He has been working on behavior modification techniques to help reduce his emotional eating and has been somewhat successful. His mood is stable and he shows no sign of suicidal or homicidal ideations.  Depression screen Daniel [name] 2/9 12/24/2016  Decreased Interest 0  Down,  Depressed, Hopeless 0  PHQ - 2 Score 0  Altered sleeping 0  Tired, decreased energy 1  Change in appetite 0  Feeling bad or failure about yourself  0  Trouble concentrating 1  Moving slowly or fidgety/restless 0  Suicidal thoughts 0  PHQ-9 Score 2    ALLERGIES: Allergies  Allergen Reactions  . Bee Venom Anaphylaxis  . Shellfish Allergy Anaphylaxis    MEDICATIONS: Current Outpatient Medications on File Prior to Visit  Medication Sig Dispense Refill  . albuterol (VENTOLIN HFA) 108 (90 Base) MCG/ACT inhaler INHALE 2 PUFFS INTO THE LUNGS EVERY 4 HOURS AS NEEDED FOR WHEEZING OR SHORTNESS OF BREATH. 18 g 5  . EPINEPHrine (EPIPEN 2-PAK) 0.3 mg/0.3 mL SOAJ injection Inject 0.3 mLs (0.3 mg total) into the muscle once. 1 Device 11  . levocetirizine (XYZAL) 5 MG tablet TAKE 1 TABLET BY MOUTH EVERY EVENING. 90 tablet 3  . montelukast (SINGULAIR) 10 MG tablet TAKE 1 TABLET (10 MG TOTAL) BY MOUTH AT BEDTIME. 90 tablet 3  . Multiple Vitamin (MULTIVITAMIN WITH MINERALS) TABS Take 1 tablet by mouth daily.    [name] omeprazole (PRILOSEC) 20 MG capsule TAKE 1 CAPSULE BY MOUTH ONCE DAILY 90 capsule 3  . polyethylene glycol powder (GLYCOLAX/MIRALAX) powder Take 17 g by mouth daily as needed. 3350 g 1  . Polyvinyl Alcohol-Povidone (CLEAR EYES ALL SEASONS) 5-6 MG/ML SOLN Apply 1 drop to eye once as needed. Reported on 02/01/2016     No current facility-administered medications on file prior to visit.  PAST MEDICAL HISTORY: Past Medical History:  Diagnosis Date  . Asthma   . GERD (gastroesophageal reflux disease)   . Hypertension     PAST SURGICAL HISTORY: No past surgical history on file.  SOCIAL HISTORY: Social History   Tobacco Use  . Smoking status: Never Smoker  . Smokeless tobacco: Never Used  Substance Use Topics  . Alcohol use: No    Alcohol/week: 0.0 oz  . Drug use: No    FAMILY HISTORY: Family History  Problem Relation Age of Onset  . Hyperlipidemia Unknown   . Hypertension  Unknown   . Stroke Unknown   . Hypertension Mother   . Hyperlipidemia Mother   . Obesity Mother   . Sudden death Father     ROS: Review of Systems  Constitutional: Negative for weight loss.  Respiratory: Negative for shortness of breath.   Cardiovascular: Negative for chest pain.  Gastrointestinal: Negative for nausea and vomiting.  Musculoskeletal:       Negative muscle weakness  Psychiatric/Behavioral: Positive for depression. Negative for suicidal ideas.    PHYSICAL EXAM: Blood pressure 118/79, pulse 66, temperature 97.9 F (36.6 C), temperature source Oral, height 5\' 8"  (1.727 m), weight 280 lb (127 kg), SpO2 97 %. Body mass index is 42.57 kg/m. Physical Exam  Constitutional: He is oriented to person, place, and time. He appears well-developed and well-nourished.  Cardiovascular: Normal rate.  Pulmonary/Chest: Effort normal.  Musculoskeletal: Normal range of motion.  Neurological: He is oriented to person, place, and time.  Skin: Skin is warm and dry.  Psychiatric: He has a normal mood and affect. His behavior is normal.  Vitals reviewed.   RECENT LABS AND TESTS: BMET    Component Value Date/Time   NA 140 11/11/2017 1008   K 4.5 11/11/2017 1008   CL 104 11/11/2017 1008   CO2 24 11/11/2017 1008   GLUCOSE 80 11/11/2017 1008   GLUCOSE 103 (H) 11/07/2015 1454   BUN 16 11/11/2017 1008   CREATININE 1.28 (H) 11/11/2017 1008   CALCIUM 9.4 11/11/2017 1008   GFRNONAA 68 11/11/2017 1008   GFRAA 78 11/11/2017 1008   Lab Results  Component Value Date   HGBA1C 5.4 11/11/2017   HGBA1C 5.3 08/06/2017   HGBA1C 5.6 04/08/2017   HGBA1C 5.5 12/24/2016   Lab Results  Component Value Date   INSULIN 23.2 11/11/2017   INSULIN 27.6 (H) 08/06/2017   INSULIN 28.9 (H) 04/08/2017   INSULIN 25.8 (H) 12/24/2016   CBC    Component Value Date/Time   WBC 6.6 12/24/2016 1021   WBC 10.2 11/07/2015 1454   RBC 5.47 12/24/2016 1021   RBC 5.67 11/07/2015 1454   HGB 15.2 12/24/2016  1021   HCT 44.3 12/24/2016 1021   PLT 300.0 11/07/2015 1454   MCV 81 12/24/2016 1021   MCH 27.8 12/24/2016 1021   MCH 28.0 07/18/2012 2030   MCHC 34.3 12/24/2016 1021   MCHC 33.3 11/07/2015 1454   RDW 14.6 12/24/2016 1021   LYMPHSABS 2.7 12/24/2016 1021   MONOABS 0.9 11/07/2015 1454   EOSABS 0.2 12/24/2016 1021   BASOSABS 0.1 12/24/2016 1021   Iron/TIBC/Ferritin/ %Sat No results found for: IRON, TIBC, FERRITIN, IRONPCTSAT Lipid Panel     Component Value Date/Time   CHOL 182 08/06/2017 1102   TRIG 73 08/06/2017 1102   HDL 39 (L) 08/06/2017 1102   CHOLHDL 5 03/17/2015 0841   VLDL 22.8 03/17/2015 0841   LDLCALC 128 (H) 08/06/2017 1102   Hepatic Function Panel  Component Value Date/Time   PROT 7.6 11/11/2017 1008   ALBUMIN 4.3 11/11/2017 1008   AST 16 11/11/2017 1008   ALT 26 11/11/2017 1008   ALKPHOS 62 11/11/2017 1008   BILITOT 0.3 11/11/2017 1008   BILIDIR 0.1 11/07/2015 1454      Component Value Date/Time   TSH 1.370 12/24/2016 1021   TSH 1.39 03/17/2015 0841   TSH 1.58 03/02/2014 0911  Results for Marchia BondROOKS, Joanathan M (MRN [id number]) as of 06/10/2018 12:17  Ref. Range 11/11/2017 10:08  Vitamin D, 25-Hydroxy Latest Ref Range: 30.0 - 100.0 ng/mL 43.4    ASSESSMENT AND PLAN: Essential hypertension - Plan: diltiazem (CARDIZEM LA) 240 MG 24 hr tablet, chlorthalidone (HYGROTON) 25 MG tablet  Vitamin D deficiency - Plan: Vitamin D, Ergocalciferol, (DRISDOL) 50000 units CAPS capsule  Other depression - with emotional eating - Plan: buPROPion (WELLBUTRIN SR) 150 MG 12 hr tablet  At risk for heart disease  Class 3 severe obesity with serious comorbidity and body mass index (BMI) of 40.0 to 44.9 in adult, unspecified obesity type (HCC)  PLAN:  Hypertension We discussed sodium restriction, working on healthy weight loss, and a regular exercise program as the means to achieve improved blood pressure control. Kell agreed with this plan and agreed to follow up as  directed. We will continue to monitor his blood pressure as well as his progress with the above lifestyle modifications. [name] agrees to continue taking diltiazem 240 mg qd #30 and we will refill for 1 month; and he agrees to continue taking [place] 25 mg qd #30 and we will refill for 1 month. He will watch for signs of hypotension as he continues his lifestyle modifications. [name] agrees to follow up with our clinic in 2 to 3 weeks.  Cardiovascular risk counselling [name] was given extended (15 minutes) coronary artery disease prevention counseling today. He is 47 y.o. male and has risk factors for heart disease including obesity and hypertension. We discussed intensive lifestyle modifications today with an emphasis on specific weight loss instructions and strategies. Pt was also informed of the importance of increasing exercise and decreasing saturated fats to help prevent heart disease.  Vitamin D Deficiency [name] was informed that low vitamin D levels contributes to fatigue and are associated with obesity, breast, and colon cancer. [name] agrees to continue taking prescription Vit D @50 ,000 IU every week #4 and we will refill for 1 month. He will follow up for routine testing of vitamin D, at least 2-3 times per year. He was informed of the risk of over-replacement of vitamin D and agrees to not increase his dose unless he discusses this with us first. [name] agrees to follow up with our clinic in 2 to 3 weeks.  Depression with Emotional Eating Behaviors We discussed behavior modification techniques today to help [name] deal with his emotional eating and depression. [name] agrees to continue taking Wellbutrin SR 150 mg qd #30 and we will refill for 1 month. [name] agrees to follow up with our clinic in 2 to 3 weeks.  Obesity [name] is currently in the action stage of change. As such, his goal is to continue with weight loss efforts He has agreed to follow a lower carbohydrate, vegetable and  lean protein rich diet plan [name] has been instructed to work up to a goal of 150 minutes of combined cardio and strengthening exercise per week for weight loss and overall health benefits. We discussed the following Behavioral Modification Strategies today: increasing lean protein intake and decreasing  simple carbohydrates    Alejo has agreed to follow up with our clinic in 2 to 3 weeks. He was informed of the importance of frequent follow up visits to maximize his success with intensive lifestyle modifications for his multiple health conditions.   Trude Mcburney, am acting as transcriptionist for Illa Level, PA-C I, Illa Level [facility], have reviewed this note and agree with its content

## 2018-07-01 ENCOUNTER — Ambulatory Visit (INDEPENDENT_AMBULATORY_CARE_PROVIDER_SITE_OTHER): Payer: Self-pay | Admitting: Physician Assistant

## 2018-07-15 ENCOUNTER — Encounter (INDEPENDENT_AMBULATORY_CARE_PROVIDER_SITE_OTHER): Payer: Self-pay | Admitting: Physician Assistant

## 2018-07-15 ENCOUNTER — Ambulatory Visit (INDEPENDENT_AMBULATORY_CARE_PROVIDER_SITE_OTHER): Payer: 59 | Admitting: Physician Assistant

## 2018-07-15 VITALS — BP 125/74 | HR 69 | Temp 97.8°F | Ht 68.0 in | Wt 278.0 lb

## 2018-07-15 DIAGNOSIS — F3289 Other specified depressive episodes: Secondary | ICD-10-CM

## 2018-07-15 DIAGNOSIS — E559 Vitamin D deficiency, unspecified: Secondary | ICD-10-CM | POA: Diagnosis not present

## 2018-07-15 DIAGNOSIS — I10 Essential (primary) hypertension: Secondary | ICD-10-CM | POA: Diagnosis not present

## 2018-07-15 DIAGNOSIS — Z9189 Other specified personal risk factors, not elsewhere classified: Secondary | ICD-10-CM

## 2018-07-15 DIAGNOSIS — Z6841 Body Mass Index (BMI) 40.0 and over, adult: Secondary | ICD-10-CM

## 2018-07-15 MED ORDER — CHLORTHALIDONE 25 MG PO TABS: 25.0000 mg | ORAL_TABLET | Freq: Every day | ORAL | 0 refills | Status: DC

## 2018-07-15 MED ORDER — VITAMIN D (ERGOCALCIFEROL) 1.25 MG (50000 UNIT) PO CAPS: 50000.0000 [IU] | ORAL_CAPSULE | ORAL | 0 refills | Status: DC

## 2018-07-15 MED ORDER — BUPROPION HCL ER (SR) 150 MG PO TB12: 150.0000 mg | ORAL_TABLET | Freq: Every day | ORAL | 0 refills | Status: DC

## 2018-07-15 MED ORDER — DILTIAZEM HCL ER COATED BEADS 240 MG PO TB24: 240.0000 mg | ORAL_TABLET | Freq: Every day | ORAL | 0 refills | Status: DC

## 2018-07-15 MED FILL — CHLORTHALIDONE 25 MG TAB: 25 | 30 days supply | Qty: 30 | Fill #0

## 2018-07-15 MED FILL — VIT D2 1.25 MG (50,000 UNIT: 1.25 MG | 28 days supply | Qty: 4 | Fill #0

## 2018-07-15 MED FILL — DILTIAZEM ER 240 MG TABLET: 240 | 30 days supply | Qty: 30 | Fill #0

## 2018-07-15 NOTE — Progress Notes (Signed)
Office: [phone]  /  Fax: [phone]   HPI:   Chief Complaint: OBESITY Daniel [name] is here to discuss his progress with his obesity treatment plan. He is on the lower carbohydrate, vegetable and lean protein rich diet plan and is following his eating plan approximately 35-40 % of the time. He states he is doing cardio and weights for 60 minutes 5 times per week. Daniel [name] did well with weight loss. He reports that he has a hard time following the plan at lunch when he goes out to eat.  His weight is 278 lb (126.1 kg) today and has had a weight loss of 2 pounds over a period of 5 weeks since his last visit. He has lost 31 lbs since starting treatment with Daniel [name].  Hypertension [name] is a 47 y.o. male with hypertension. Levent's blood pressure is controlled and he denies chest pain. He is working weight loss to help control his blood pressure with the goal of decreasing his risk of heart attack and stroke.   At risk for cardiovascular disease Daniel [name] is at a higher than average risk for cardiovascular disease due to obesity and hypertension. He currently denies any chest pain.  Vitamin D Deficiency Daniel [name] has a diagnosis of vitamin D deficiency. He is on prescription Vit D and denies nausea, vomiting or muscle weakness.  Depression with emotional eating behaviors Daniel [name] denies cravings or emotional eating. Daniel [name] emotional eating and using food for comfort to the extent that it is negatively impacting his health. He often snacks when he is not hungry. Daniel [name] sometimes feels he is out of control and then feels guilty that he made poor food choices. He has been working on behavior modification techniques to help reduce his emotional eating and has been somewhat successful. He shows no sign of suicidal or homicidal ideations.  Depression screen [name] 2/9 12/24/2016  Decreased Interest 0  Down, Depressed, Hopeless 0  PHQ - 2 Score 0  Altered sleeping 0  Tired, decreased energy 1  Change in  appetite 0  Feeling bad or failure about yourself  0  Trouble concentrating 1  Moving slowly or fidgety/restless 0  Suicidal thoughts 0  PHQ-9 Score 2    ALLERGIES: Allergies  Allergen Reactions  . Bee Venom Anaphylaxis  . Shellfish Allergy Anaphylaxis    MEDICATIONS: Current Outpatient Medications on File Prior to Visit  Medication Sig Dispense Refill  . albuterol (VENTOLIN HFA) 108 (90 Base) MCG/ACT inhaler INHALE 2 PUFFS INTO THE LUNGS EVERY 4 HOURS AS NEEDED FOR WHEEZING OR SHORTNESS OF BREATH. 18 g 5  . EPINEPHrine (EPIPEN 2-PAK) 0.3 mg/0.3 mL SOAJ injection Inject 0.3 mLs (0.3 mg total) into the muscle once. 1 Device 11  . levocetirizine (XYZAL) 5 MG tablet TAKE 1 TABLET BY MOUTH EVERY EVENING. 90 tablet 3  . montelukast (SINGULAIR) 10 MG tablet TAKE 1 TABLET (10 MG TOTAL) BY MOUTH AT BEDTIME. 90 tablet 3  . Multiple Vitamin (MULTIVITAMIN WITH MINERALS) TABS Take 1 tablet by mouth daily.    [name] omeprazole (PRILOSEC) 20 MG capsule TAKE 1 CAPSULE BY MOUTH ONCE DAILY 90 capsule 3  . polyethylene glycol powder (GLYCOLAX/MIRALAX) powder Take 17 g by mouth daily as needed. 3350 g 1  . Polyvinyl Alcohol-Povidone (CLEAR EYES ALL SEASONS) 5-6 MG/ML SOLN Apply 1 drop to eye once as needed. Reported on 02/01/2016     No current facility-administered medications on file prior to visit.     PAST MEDICAL HISTORY: Past Medical History:  Diagnosis  Date  . Asthma   . GERD (gastroesophageal reflux disease)   . Hypertension     PAST SURGICAL HISTORY: No past surgical history on file.  SOCIAL HISTORY: Social History   Tobacco Use  . Smoking status: Never Smoker  . Smokeless tobacco: Never Used  Substance Use Topics  . Alcohol use: No    Alcohol/week: 0.0 standard drinks  . Drug use: No    FAMILY HISTORY: Family History  Problem Relation Age of Onset  . Hyperlipidemia Unknown   . Hypertension Unknown   . Stroke Unknown   . Hypertension Mother   . Hyperlipidemia Mother   .  Obesity Mother   . Sudden death Father     ROS: Review of Systems  Constitutional: Positive for weight loss.  Cardiovascular: Negative for chest pain.  Gastrointestinal: Negative for nausea and vomiting.  Musculoskeletal:       Negative muscle weakness  Psychiatric/Behavioral: Positive for depression. Negative for suicidal ideas.    PHYSICAL EXAM: Blood pressure 125/74, pulse 69, temperature 97.8 F (36.6 C), temperature source Oral, height 5\' 8"  (1.727 m), weight 278 lb (126.1 kg), SpO2 96 %. Body mass index is 42.27 kg/m. Physical Exam  Constitutional: He is oriented to person, place, and time. He appears well-developed and well-nourished.  Cardiovascular: Normal rate.  Pulmonary/Chest: Effort normal.  Musculoskeletal: Normal range of motion.  Neurological: He is oriented to person, place, and time.  Skin: Skin is warm and dry.  Psychiatric: He has a normal mood and affect. His behavior is normal.  Vitals reviewed.   RECENT LABS AND TESTS: BMET    Component Value Date/Time   NA 140 11/11/2017 1008   K 4.5 11/11/2017 1008   CL 104 11/11/2017 1008   CO2 24 11/11/2017 1008   GLUCOSE 80 11/11/2017 1008   GLUCOSE 103 (H) 11/07/2015 1454   BUN 16 11/11/2017 1008   CREATININE 1.28 (H) 11/11/2017 1008   CALCIUM 9.4 11/11/2017 1008   GFRNONAA 68 11/11/2017 1008   GFRAA 78 11/11/2017 1008   Lab Results  Component Value Date   HGBA1C 5.4 11/11/2017   HGBA1C 5.3 08/06/2017   HGBA1C 5.6 04/08/2017   HGBA1C 5.5 12/24/2016   Lab Results  Component Value Date   INSULIN 23.2 11/11/2017   INSULIN 27.6 (H) 08/06/2017   INSULIN 28.9 (H) 04/08/2017   INSULIN 25.8 (H) 12/24/2016   CBC    Component Value Date/Time   WBC 6.6 12/24/2016 1021   WBC 10.2 11/07/2015 1454   RBC 5.47 12/24/2016 1021   RBC 5.67 11/07/2015 1454   HGB 15.2 12/24/2016 1021   HCT 44.3 12/24/2016 1021   PLT 300.0 11/07/2015 1454   MCV 81 12/24/2016 1021   MCH 27.8 12/24/2016 1021   MCH 28.0  07/18/2012 2030   MCHC 34.3 12/24/2016 1021   MCHC 33.3 11/07/2015 1454   RDW 14.6 12/24/2016 1021   LYMPHSABS 2.7 12/24/2016 1021   MONOABS 0.9 11/07/2015 1454   EOSABS 0.2 12/24/2016 1021   BASOSABS 0.1 12/24/2016 1021   Iron/TIBC/Ferritin/ %Sat No results found for: IRON, TIBC, FERRITIN, IRONPCTSAT Lipid Panel     Component Value Date/Time   CHOL 182 08/06/2017 1102   TRIG 73 08/06/2017 1102   HDL 39 (L) 08/06/2017 1102   CHOLHDL 5 03/17/2015 0841   VLDL 22.8 03/17/2015 0841   LDLCALC 128 (H) 08/06/2017 1102   Hepatic Function Panel     Component Value Date/Time   PROT 7.6 11/11/2017 1008   ALBUMIN 4.3  11/11/2017 1008   AST 16 11/11/2017 1008   ALT 26 11/11/2017 1008   ALKPHOS 62 11/11/2017 1008   BILITOT 0.3 11/11/2017 1008   BILIDIR 0.1 11/07/2015 1454      Component Value Date/Time   TSH 1.370 12/24/2016 1021   TSH 1.39 03/17/2015 0841   TSH 1.58 03/02/2014 0911  Results for Marchia BondROOKS, Quantay M (MRN [id number]) as of 07/15/2018 13:00  Ref. Range 11/11/2017 10:08  Vitamin D, 25-Hydroxy Latest Ref Range: 30.0 - 100.0 ng/mL 43.4    ASSESSMENT AND PLAN: Essential hypertension - Plan: diltiazem (CARDIZEM LA) 240 MG 24 hr tablet, chlorthalidone (HYGROTON) 25 MG tablet  Vitamin D deficiency - Plan: Vitamin D, Ergocalciferol, (DRISDOL) 50000 units CAPS capsule  Other depression - with emotional eating - Plan: buPROPion (WELLBUTRIN SR) 150 MG 12 hr tablet  At risk for heart disease  Class 3 severe obesity with serious comorbidity and body mass index (BMI) of 40.0 to 44.9 in adult, unspecified obesity type (HCC)  PLAN:  Hypertension We discussed sodium restriction, working on healthy weight loss, and a regular exercise program as the means to achieve improved blood pressure control. Reyan agreed with this plan and agreed to follow up as directed. We will continue to monitor his blood pressure as well as his progress with the above lifestyle modifications. Teagan agree  to continue taking diltazem 240 mg qd #30 and we will refill for 1 month and he agrees to continue taking chlorthalidone 25 mg qd #30 and we will refill for 1 month. He will watch for signs of hypotension as he continues his lifestyle modifications. [name] agrees to follow up with our clinic in 3 weeks.  Cardiovascular risk counselling [name] was given extended (15 minutes) coronary artery disease prevention counseling today. He is 47 y.o. male and has risk factors for heart disease including obesity and hypertension. We discussed intensive lifestyle modifications today with an emphasis on specific weight loss instructions and strategies. Pt was also informed of the importance of increasing exercise and decreasing saturated fats to help prevent heart disease.  Vitamin D Deficiency [name] was informed that low vitamin D levels contributes to fatigue and are associated with obesity, breast, and colon cancer. [name] agrees to continue taking prescription Vit D @50 ,000 IU every week #4 and we will refill for 1 month. He will follow up for routine testing of vitamin D, at least 2-3 times per year. He was informed of the risk of over-replacement of vitamin D and agrees to not increase his dose unless he discusses this with us first. [name] agrees to follow up with our clinic in 3 weeks.  Depression with Emotional Eating Behaviors We discussed behavior modification techniques today to help [name] deal with his emotional eating and depression. [name] agrees to continue taking bupropion SR 150 mg qd #30 and we will refill for 1 month. [name] agrees to follow up with our clinic in 3 weeks.  Obesity [name] is currently in the action stage of change. As such, his goal is to continue with weight loss efforts He has agreed to keep a food journal with 350-500 calories and 35+ grams of protein at lunch daily and follow the Category 3 plan [name] has been instructed to work up to a goal of 150 minutes of combined  cardio and strengthening exercise per week for weight loss and overall health benefits. We discussed the following Behavioral Modification Strategies today: ways to avoid boredom eating and keeping healthy foods in the home   [place]  has agreed to follow up with our clinic in 3 weeks. He was informed of the importance of frequent follow up visits to maximize his success with intensive lifestyle modifications for his multiple health conditions.   OBESITY BEHAVIORAL INTERVENTION VISIT  Today's visit was # 31.  Starting weight: 309 lbs Starting date: 12/24/16 Today's weight : 278 lbs  Today's date: 07/15/2018 Total lbs lost to date: 8031    ASK: We discussed the diagnosis of obesity with Marchia Bondracey M Shimko today and [name] agreed to give us permission to discuss obesity behavioral modification therapy today.  ASSESS: [name] has the diagnosis of obesity and his BMI today is 42.28 [name] is in the action stage of change   ADVISE: [name] was educated on the multiple health risks of obesity as well as the benefit of weight loss to improve his health. He was advised of the need for long term treatment and the importance of lifestyle modifications.  AGREE: Multiple dietary modification options and treatment options were discussed and  Vinal agreed to the above obesity treatment plan.  Trude McburneyI, Sharon Martin, am acting as transcriptionist for Alois Clicheracey Ashle Stief, PA-C I, Alois Clicheracey Haislee Corso, PA-C have reviewed above note and agree with its content

## 2018-07-28 ENCOUNTER — Encounter: Payer: Self-pay | Admitting: Family Medicine

## 2018-08-05 ENCOUNTER — Ambulatory Visit (INDEPENDENT_AMBULATORY_CARE_PROVIDER_SITE_OTHER): Payer: 59 | Admitting: Physician Assistant

## 2018-08-05 VITALS — BP 126/82 | HR 69 | Temp 97.7°F | Ht 68.0 in | Wt 280.0 lb

## 2018-08-05 DIAGNOSIS — E8881 Metabolic syndrome: Secondary | ICD-10-CM

## 2018-08-05 DIAGNOSIS — E559 Vitamin D deficiency, unspecified: Secondary | ICD-10-CM

## 2018-08-05 DIAGNOSIS — Z9189 Other specified personal risk factors, not elsewhere classified: Secondary | ICD-10-CM

## 2018-08-05 DIAGNOSIS — Z6841 Body Mass Index (BMI) 40.0 and over, adult: Secondary | ICD-10-CM | POA: Diagnosis not present

## 2018-08-05 DIAGNOSIS — E7849 Other hyperlipidemia: Secondary | ICD-10-CM

## 2018-08-06 LAB — HEMOGLOBIN A1C
Est. average glucose Bld gHb Est-mCnc: 108 mg/dL
Hgb A1c MFr Bld: 5.4 % (ref 4.8–5.6)

## 2018-08-06 LAB — COMPREHENSIVE METABOLIC PANEL
A/G RATIO: 1.4 (ref 1.2–2.2)
ALBUMIN: 4.4 g/dL (ref 3.5–5.5)
ALT: 40 IU/L (ref 0–44)
AST: 26 IU/L (ref 0–40)
Alkaline Phosphatase: 66 IU/L (ref 39–117)
BILIRUBIN TOTAL: 0.3 mg/dL (ref 0.0–1.2)
BUN / CREAT RATIO: 13 (ref 9–20)
BUN: 16 mg/dL (ref 6–24)
CALCIUM: 9.3 mg/dL (ref 8.7–10.2)
CHLORIDE: 102 mmol/L (ref 96–106)
CO2: 24 mmol/L (ref 20–29)
Creatinine, Ser: 1.2 mg/dL (ref 0.76–1.27)
GFR calc Af Amer: 84 mL/min/{1.73_m2} (ref >59)
GFR, EST NON AFRICAN AMERICAN: 73 mL/min/{1.73_m2} (ref >59)
GLOBULIN, TOTAL: 3.2 g/dL (ref 1.5–4.5)
Glucose: 81 mg/dL (ref 65–99)
POTASSIUM: 3.9 mmol/L (ref 3.5–5.2)
SODIUM: 141 mmol/L (ref 134–144)
TOTAL PROTEIN: 7.6 g/dL (ref 6.0–8.5)

## 2018-08-06 LAB — LIPID PANEL WITH LDL/HDL RATIO
Cholesterol, Total: 182 mg/dL (ref 100–199)
HDL: 42 mg/dL (ref >39)
LDL CALC: 125 mg/dL — AB (ref 0–99)
LDL/HDL RATIO: 3 ratio (ref 0.0–3.6)
Triglycerides: 74 mg/dL (ref 0–149)
VLDL Cholesterol Cal: 15 mg/dL (ref 5–40)

## 2018-08-06 LAB — INSULIN, RANDOM: INSULIN: 21.7 u[IU]/mL (ref 2.6–24.9)

## 2018-08-06 LAB — VITAMIN D 25 HYDROXY (VIT D DEFICIENCY, FRACTURES): VIT D 25 HYDROXY: 54.7 ng/mL (ref 30.0–100.0)

## 2018-08-06 MED FILL — MONTELUKAST SOD 10 MG TAB: 10 | 90 days supply | Qty: 90 | Fill #1

## 2018-08-06 MED FILL — LEVOCETIRIZINE 5 MG TABLET: 5 | 90 days supply | Qty: 90 | Fill #1

## 2018-08-06 NOTE — Progress Notes (Signed)
Office: [phone]  /  Fax: [phone]   HPI:   Chief Complaint: OBESITY Daniel [name] is here to discuss his progress with his obesity treatment plan. He is on the  keep a food journal with 350 to 500 calories and 35+ grams of protein at lunch and follow the Category 3 plan and is following his eating plan approximately 50 % of the time. He states he is doing cardio and weights 60 minutes 4 times per week. Daniel [name] struggled to follow the plan recently due to celebration eating and indulging in foods while watching football. Daniel [name] is ready to get back on track. His weight is 280 lb (127 kg) today and has not lost weight since his last visit. He has lost 29 lbs since starting treatment with [name].  Hyperlipidemia Daniel [name] has hyperlipidemia and he is not on medications currently. Daniel [name] has been trying to improve his cholesterol levels with intensive lifestyle modification including a low saturated fat diet, exercise and weight loss. He denies any chest pain.  Vitamin D deficiency Daniel [name] has a diagnosis of vitamin D deficiency. He is currently taking vit D and denies nausea, vomiting or muscle weakness.  At risk for osteopenia and osteoporosis Daniel [name] is at higher risk of osteopenia and osteoporosis due to vitamin D deficiency.   Insulin Resistance Daniel [name] has a diagnosis of insulin resistance based on his elevated fasting insulin level >5. Although Daniel [name]'s blood glucose readings are still under good control, insulin resistance puts him at greater risk of metabolic syndrome and diabetes. He is not taking metformin currently and continues to work on diet and exercise to decrease risk of diabetes. Daniel [name] denies polyphagia.  ALLERGIES: Allergies  Allergen Reactions  . Bee Venom Anaphylaxis  . Shellfish Allergy Anaphylaxis    MEDICATIONS: Current Outpatient Medications on File Prior to Visit  Medication Sig Dispense Refill  . albuterol (VENTOLIN HFA) 108 (90 Base) MCG/ACT inhaler INHALE 2 PUFFS  INTO THE LUNGS EVERY 4 HOURS AS NEEDED FOR WHEEZING OR SHORTNESS OF BREATH. 18 g 5  . buPROPion (WELLBUTRIN SR) 150 MG 12 hr tablet Take 1 tablet (150 mg total) by mouth daily. 30 tablet 0  . chlorthalidone (HYGROTON) 25 MG tablet Take 1 tablet (25 mg total) by mouth daily. 30 tablet 0  . diltiazem (CARDIZEM LA) 240 MG 24 hr tablet Take 1 tablet (240 mg total) by mouth daily. 30 tablet 0  . EPINEPHrine (EPIPEN 2-PAK) 0.3 mg/0.3 mL SOAJ injection Inject 0.3 mLs (0.3 mg total) into the muscle once. 1 Device 11  . levocetirizine (XYZAL) 5 MG tablet TAKE 1 TABLET BY MOUTH EVERY EVENING. 90 tablet 3  . montelukast (SINGULAIR) 10 MG tablet TAKE 1 TABLET (10 MG TOTAL) BY MOUTH AT BEDTIME. 90 tablet 3  . Multiple Vitamin (MULTIVITAMIN WITH MINERALS) TABS Take 1 tablet by mouth daily.    [name] omeprazole (PRILOSEC) 20 MG capsule TAKE 1 CAPSULE BY MOUTH ONCE DAILY 90 capsule 3  . polyethylene glycol powder (GLYCOLAX/MIRALAX) powder Take 17 g by mouth daily as needed. 3350 g 1  . Polyvinyl Alcohol-Povidone (CLEAR EYES ALL SEASONS) 5-6 MG/ML SOLN Apply 1 drop to eye once as needed. Reported on 02/01/2016    . Vitamin D, Ergocalciferol, (DRISDOL) 50000 units CAPS capsule Take 1 capsule (50,000 Units total) by mouth every 7 (seven) days. 4 capsule 0   No current facility-administered medications on file prior to visit.     PAST MEDICAL HISTORY: Past Medical History:  Diagnosis Date  . Asthma   .  GERD (gastroesophageal reflux disease)   . Hypertension     PAST SURGICAL HISTORY: No past surgical history on file.  SOCIAL HISTORY: Social History   Tobacco Use  . Smoking status: Never Smoker  . Smokeless tobacco: Never Used  Substance Use Topics  . Alcohol use: No    Alcohol/week: 0.0 standard drinks  . Drug use: No    FAMILY HISTORY: Family History  Problem Relation Age of Onset  . Hyperlipidemia Unknown   . Hypertension Unknown   . Stroke Unknown   . Hypertension Mother   . Hyperlipidemia  Mother   . Obesity Mother   . Sudden death Father     ROS: Review of Systems  Constitutional: Negative for weight loss.  Cardiovascular: Negative for chest pain.  Gastrointestinal: Negative for nausea and vomiting.  Musculoskeletal:       Negative for muscle weakness  Endo/Heme/Allergies:       Negative for polyphagia    PHYSICAL EXAM: Blood pressure 126/82, pulse 69, temperature 97.7 F (36.5 C), temperature source Oral, height 5\' 8"  (1.727 m), weight 280 lb (127 kg), SpO2 97 %. Body mass index is 42.57 kg/m. Physical Exam  Constitutional: He is oriented to person, place, and time. He appears well-developed and well-nourished.  Cardiovascular: Normal rate.  Pulmonary/Chest: Effort normal.  Musculoskeletal: Normal range of motion.  Neurological: He is oriented to person, place, and time.  Skin: Skin is warm and dry.  Psychiatric: He has a normal mood and affect. His behavior is normal.  Vitals reviewed.   RECENT LABS AND TESTS: BMET    Component Value Date/Time   NA 141 08/05/2018 1301   K 3.9 08/05/2018 1301   CL 102 08/05/2018 1301   CO2 24 08/05/2018 1301   GLUCOSE 81 08/05/2018 1301   GLUCOSE 103 (H) 11/07/2015 1454   BUN 16 08/05/2018 1301   CREATININE 1.20 08/05/2018 1301   CALCIUM 9.3 08/05/2018 1301   GFRNONAA 73 08/05/2018 1301   GFRAA 84 08/05/2018 1301   Lab Results  Component Value Date   HGBA1C 5.4 08/05/2018   HGBA1C 5.4 11/11/2017   HGBA1C 5.3 08/06/2017   HGBA1C 5.6 04/08/2017   HGBA1C 5.5 12/24/2016   Lab Results  Component Value Date   INSULIN 21.7 08/05/2018   INSULIN 23.2 11/11/2017   INSULIN 27.6 (H) 08/06/2017   INSULIN 28.9 (H) 04/08/2017   INSULIN 25.8 (H) 12/24/2016   CBC    Component Value Date/Time   WBC 6.6 12/24/2016 1021   WBC 10.2 11/07/2015 1454   RBC 5.47 12/24/2016 1021   RBC 5.67 11/07/2015 1454   HGB 15.2 12/24/2016 1021   HCT 44.3 12/24/2016 1021   PLT 300.0 11/07/2015 1454   MCV 81 12/24/2016 1021   MCH  27.8 12/24/2016 1021   MCH 28.0 07/18/2012 2030   MCHC 34.3 12/24/2016 1021   MCHC 33.3 11/07/2015 1454   RDW 14.6 12/24/2016 1021   LYMPHSABS 2.7 12/24/2016 1021   MONOABS 0.9 11/07/2015 1454   EOSABS 0.2 12/24/2016 1021   BASOSABS 0.1 12/24/2016 1021   Iron/TIBC/Ferritin/ %Sat No results found for: IRON, TIBC, FERRITIN, IRONPCTSAT Lipid Panel     Component Value Date/Time   CHOL 182 08/05/2018 1301   TRIG 74 08/05/2018 1301   HDL 42 08/05/2018 1301   CHOLHDL 5 03/17/2015 0841   VLDL 22.8 03/17/2015 0841   LDLCALC 125 (H) 08/05/2018 1301   Hepatic Function Panel     Component Value Date/Time   PROT 7.6 08/05/2018 1301  ALBUMIN 4.4 08/05/2018 1301   AST 26 08/05/2018 1301   ALT 40 08/05/2018 1301   ALKPHOS 66 08/05/2018 1301   BILITOT 0.3 08/05/2018 1301   BILIDIR 0.1 11/07/2015 1454      Component Value Date/Time   TSH 1.370 12/24/2016 1021   TSH 1.39 03/17/2015 0841   TSH 1.58 03/02/2014 0911   Results for MAURI, TOLEN (MRN [id number]) as of 08/06/2018 08:43  Ref. Range 11/11/2017 10:08  Vitamin D, 25-Hydroxy Latest Ref Range: 30.0 - 100.0 ng/mL 43.4   ASSESSMENT AND PLAN: Other hyperlipidemia - Plan: Comprehensive metabolic panel, Lipid Panel With LDL/HDL Ratio  Vitamin D deficiency - Plan: VITAMIN D 25 Hydroxy (Vit-D Deficiency, Fractures)  Insulin resistance - Plan: Comprehensive metabolic panel, Hemoglobin A1c, Insulin, random  At risk for osteoporosis  Class 3 severe obesity with serious comorbidity and body mass index (BMI) of 40.0 to 44.9 in adult, unspecified obesity type (HCC)  PLAN:  Hyperlipidemia Hayes was informed of the American Heart Association Guidelines emphasizing intensive lifestyle modifications as the first line treatment for hyperlipidemia. We discussed many lifestyle modifications today in depth, and Virgal will continue to work on decreasing saturated fats such as fatty red meat, butter and many fried foods. He will also increase  vegetables and lean protein in his diet and continue to work on exercise and weight loss efforts. We will check labs and Oaklen will follow up as directed.  Vitamin D Deficiency Jakob was informed that low vitamin D levels contributes to fatigue and are associated with obesity, breast, and colon cancer. He agrees to continue to take prescription Vit D @50 ,000 IU every week and will follow up for routine testing of vitamin D, at least 2-3 times per year. He was informed of the risk of over-replacement of vitamin D and agrees to not increase his dose unless he discusses this with [name] first. We will check labs and Daniel [name] will follow up as directed.  At risk for osteopenia and osteoporosis Daniel [name] was given extended  (15 minutes) osteoporosis prevention counseling today. Daniel [name] is at risk for osteopenia and osteoporosis due to his vitamin D deficiency. He was encouraged to take his vitamin D and follow his higher calcium diet and increase strengthening exercise to help strengthen his bones and decrease his risk of osteopenia and osteoporosis.  Insulin Resistance Daniel [name] will continue to work on weight loss, exercise, and decreasing simple carbohydrates in his diet to help decrease the risk of diabetes. He was informed that eating too many simple carbohydrates or too many calories at one sitting increases the likelihood of GI side effects. Daniel [name] agreed to follow up with [name] as directed to monitor his progress.  Obesity Daniel [name] is currently in the action stage of change. As such, his goal is to continue with weight loss efforts He has agreed to keep a food journal with 350 to 500 calories and 35 grams of protein at lunch daily and follow the Category 3 plan Daniel [name] has been instructed to work up to a goal of 150 minutes of combined cardio and strengthening exercise per week for weight loss and overall health benefits. We discussed the following Behavioral Modification Strategies today: planning for success,  work on meal planning and easy cooking plans, celebration eating strategies  and avoiding temptations  Daniel [name] has agreed to follow up with our clinic in 2 weeks. He was informed of the importance of frequent follow up visits to maximize his success with intensive lifestyle modifications for his multiple health conditions.  OBESITY BEHAVIORAL INTERVENTION VISIT  Today's visit was # 32  Starting weight: 309 lbs Starting date: 12/24/16 Today's weight : 280 lbs  Today's date: 08/05/2018 Total lbs lost to date: 6729   ASK: We discussed the diagnosis of obesity with Daniel [name] today and Daniel [name] agreed to give us permission to discuss obesity behavioral modification therapy today.  ASSESS: Daniel [name] has the diagnosis of obesity and his BMI today is 42.58 Daniel [name] is in the action stage of change   ADVISE: Daniel [name] was educated on the multiple health risks of obesity as well as the benefit of weight loss to improve his health. He was advised of the need for long term treatment and the importance of lifestyle modifications to improve his current health and to decrease his risk of future health problems.  AGREE: Multiple dietary modification options and treatment options were discussed and  Terald agreed to follow the recommendations documented in the above note.  ARRANGE: Daniel [name] was educated on the importance of frequent visits to treat obesity as outlined per CMS and USPSTF guidelines and agreed to schedule his next follow up appointment today.  Cristi LoronI, Joanne Murray, am acting as transcriptionist for Alois Clicheracey Sholanda Croson, PA-C I, Alois Clicheracey Drew Lips, PA-C have reviewed above note and agree with its content

## 2018-08-19 ENCOUNTER — Encounter (INDEPENDENT_AMBULATORY_CARE_PROVIDER_SITE_OTHER): Payer: Self-pay | Admitting: Physician Assistant

## 2018-08-19 ENCOUNTER — Ambulatory Visit (INDEPENDENT_AMBULATORY_CARE_PROVIDER_SITE_OTHER): Payer: 59 | Admitting: Physician Assistant

## 2018-08-19 VITALS — BP 125/85 | HR 63 | Temp 97.8°F | Ht 68.0 in | Wt 279.0 lb

## 2018-08-19 DIAGNOSIS — F3289 Other specified depressive episodes: Secondary | ICD-10-CM

## 2018-08-19 DIAGNOSIS — Z9189 Other specified personal risk factors, not elsewhere classified: Secondary | ICD-10-CM

## 2018-08-19 DIAGNOSIS — E559 Vitamin D deficiency, unspecified: Secondary | ICD-10-CM | POA: Diagnosis not present

## 2018-08-19 DIAGNOSIS — Z6841 Body Mass Index (BMI) 40.0 and over, adult: Secondary | ICD-10-CM

## 2018-08-19 MED ORDER — BUPROPION HCL ER (SR) 150 MG PO TB12: 150.0000 mg | ORAL_TABLET | Freq: Every day | ORAL | 0 refills | Status: DC

## 2018-08-20 NOTE — Progress Notes (Signed)
Office: [phone]  /  Fax: [phone]   HPI:   Chief Complaint: OBESITY Daniel [name] is here to discuss his progress with his obesity treatment plan. He is on the keep a food journal with 350-500 calories and 35 grams of protein at lunch daily and follow the Category 3 plan and is following his eating plan approximately 80-85 % of the time. He states he is doing cardio and weight training for 60 minutes 5 times per week. Daniel [name] did well with weight loss. He states he is not getting all of his protein in throughout the day.  His weight is 279 lb (126.6 kg) today and has had a weight loss of 1 pound over a period of 2 weeks since his last visit. He has lost 30 lbs since starting treatment with [name].  Vitamin D Deficiency Daniel [name] has a diagnosis of vitamin D deficiency. He is on prescription Vit D, last level at goal. His blood pressure is normal and he denies nausea, vomiting or muscle weakness.  At risk for osteopenia and osteoporosis Daniel [name] is at higher risk of osteopenia and osteoporosis due to vitamin D deficiency.   Depression with emotional eating behaviors Daniel [name]'s blood pressure is normal and he denies cravings. Daniel [name] struggles with emotional eating and using food for comfort to the extent that it is negatively impacting his health. He often snacks when he is not hungry. Daniel [name] sometimes feels he is out of control and then feels guilty that he made poor food choices. He has been working on behavior modification techniques to help reduce his emotional eating and has been somewhat successful. He shows no sign of suicidal or homicidal ideations.  Depression screen [facility] 2/9 12/24/2016  Decreased Interest 0  Down, Depressed, Hopeless 0  PHQ - 2 Score 0  Altered sleeping 0  Tired, decreased energy 1  Change in appetite 0  Feeling bad or failure about yourself  0  Trouble concentrating 1  Moving slowly or fidgety/restless 0  Suicidal thoughts 0  PHQ-9 Score 2    ALLERGIES: Allergies    Allergen Reactions  . Bee Venom Anaphylaxis  . Shellfish Allergy Anaphylaxis    MEDICATIONS: Current Outpatient Medications on File Prior to Visit  Medication Sig Dispense Refill  . albuterol (VENTOLIN HFA) 108 (90 Base) MCG/ACT inhaler INHALE 2 PUFFS INTO THE LUNGS EVERY 4 HOURS AS NEEDED FOR WHEEZING OR SHORTNESS OF BREATH. 18 g 5  . chlorthalidone (HYGROTON) 25 MG tablet Take 1 tablet (25 mg total) by mouth daily. 30 tablet 0  . diltiazem (CARDIZEM LA) 240 MG 24 hr tablet Take 1 tablet (240 mg total) by mouth daily. 30 tablet 0  . EPINEPHrine (EPIPEN 2-PAK) 0.3 mg/0.3 mL SOAJ injection Inject 0.3 mLs (0.3 mg total) into the muscle once. 1 Device 11  . levocetirizine (XYZAL) 5 MG tablet TAKE 1 TABLET BY MOUTH EVERY EVENING. 90 tablet 3  . montelukast (SINGULAIR) 10 MG tablet TAKE 1 TABLET (10 MG TOTAL) BY MOUTH AT BEDTIME. 90 tablet 3  . Multiple Vitamin (MULTIVITAMIN WITH MINERALS) TABS Take 1 tablet by mouth daily.    [name] omeprazole (PRILOSEC) 20 MG capsule TAKE 1 CAPSULE BY MOUTH ONCE DAILY 90 capsule 3  . polyethylene glycol powder (GLYCOLAX/MIRALAX) powder Take 17 g by mouth daily as needed. 3350 g 1  . Polyvinyl Alcohol-Povidone (CLEAR EYES ALL SEASONS) 5-6 MG/ML SOLN Apply 1 drop to eye once as needed. Reported on 02/01/2016    . Vitamin D, Ergocalciferol, (DRISDOL) 50000 units CAPS capsule  Take 1 capsule (50,000 Units total) by mouth every 7 (seven) days. 4 capsule 0   No current facility-administered medications on file prior to visit.     PAST MEDICAL HISTORY: Past Medical History:  Diagnosis Date  . Asthma   . GERD (gastroesophageal reflux disease)   . Hypertension     PAST SURGICAL HISTORY: No past surgical history on file.  SOCIAL HISTORY: Social History   Tobacco Use  . Smoking status: Never Smoker  . Smokeless tobacco: Never Used  Substance Use Topics  . Alcohol use: No    Alcohol/week: 0.0 standard drinks  . Drug use: No    FAMILY HISTORY: Family History   Problem Relation Age of Onset  . Hyperlipidemia Unknown   . Hypertension Unknown   . Stroke Unknown   . Hypertension Mother   . Hyperlipidemia Mother   . Obesity Mother   . Sudden death Father     ROS: Review of Systems  Constitutional: Positive for weight loss.  Gastrointestinal: Negative for nausea and vomiting.  Musculoskeletal:       Negative muscle weakness  Psychiatric/Behavioral: Positive for depression. Negative for suicidal ideas.    PHYSICAL EXAM: Blood pressure 125/85, pulse 63, temperature 97.8 F (36.6 C), temperature source Oral, height 5\' 8"  (1.727 m), weight 279 lb (126.6 kg), SpO2 93 %. Body mass index is 42.42 kg/m. Physical Exam  Constitutional: He is oriented to person, place, and time. He appears well-developed and well-nourished.  Cardiovascular: Normal rate.  Pulmonary/Chest: Effort normal.  Musculoskeletal: Normal range of motion.  Neurological: He is oriented to person, place, and time.  Skin: Skin is warm and dry.  Psychiatric: He has a normal mood and affect. His behavior is normal.  Vitals reviewed.   RECENT LABS AND TESTS: BMET    Component Value Date/Time   NA 141 08/05/2018 1301   K 3.9 08/05/2018 1301   CL 102 08/05/2018 1301   CO2 24 08/05/2018 1301   GLUCOSE 81 08/05/2018 1301   GLUCOSE 103 (H) 11/07/2015 1454   BUN 16 08/05/2018 1301   CREATININE 1.20 08/05/2018 1301   CALCIUM 9.3 08/05/2018 1301   GFRNONAA 73 08/05/2018 1301   GFRAA 84 08/05/2018 1301   Lab Results  Component Value Date   HGBA1C 5.4 08/05/2018   HGBA1C 5.4 11/11/2017   HGBA1C 5.3 08/06/2017   HGBA1C 5.6 04/08/2017   HGBA1C 5.5 12/24/2016   Lab Results  Component Value Date   INSULIN 21.7 08/05/2018   INSULIN 23.2 11/11/2017   INSULIN 27.6 (H) 08/06/2017   INSULIN 28.9 (H) 04/08/2017   INSULIN 25.8 (H) 12/24/2016   CBC    Component Value Date/Time   WBC 6.6 12/24/2016 1021   WBC 10.2 11/07/2015 1454   RBC 5.47 12/24/2016 1021   RBC 5.67  11/07/2015 1454   HGB 15.2 12/24/2016 1021   HCT 44.3 12/24/2016 1021   PLT 300.0 11/07/2015 1454   MCV 81 12/24/2016 1021   MCH 27.8 12/24/2016 1021   MCH 28.0 07/18/2012 2030   MCHC 34.3 12/24/2016 1021   MCHC 33.3 11/07/2015 1454   RDW 14.6 12/24/2016 1021   LYMPHSABS 2.7 12/24/2016 1021   MONOABS 0.9 11/07/2015 1454   EOSABS 0.2 12/24/2016 1021   BASOSABS 0.1 12/24/2016 1021   Iron/TIBC/Ferritin/ %Sat No results found for: IRON, TIBC, FERRITIN, IRONPCTSAT Lipid Panel     Component Value Date/Time   CHOL 182 08/05/2018 1301   TRIG 74 08/05/2018 1301   HDL 42 08/05/2018 1301  CHOLHDL 5 03/17/2015 0841   VLDL 22.8 03/17/2015 0841   LDLCALC 125 (H) 08/05/2018 1301   Hepatic Function Panel     Component Value Date/Time   PROT 7.6 08/05/2018 1301   ALBUMIN 4.4 08/05/2018 1301   AST 26 08/05/2018 1301   ALT 40 08/05/2018 1301   ALKPHOS 66 08/05/2018 1301   BILITOT 0.3 08/05/2018 1301   BILIDIR 0.1 11/07/2015 1454      Component Value Date/Time   TSH 1.370 12/24/2016 1021   TSH 1.39 03/17/2015 0841   TSH 1.58 03/02/2014 0911  Results for ELAZAR, ARGABRIGHT (MRN [id number]) as of 08/20/2018 08:22  Ref. Range 08/05/2018 13:01  Vitamin D, 25-Hydroxy Latest Ref Range: 30.0 - 100.0 ng/mL 54.7    ASSESSMENT AND PLAN: Vitamin D deficiency  Other depression - with emotional eating - Plan: buPROPion (WELLBUTRIN SR) 150 MG 12 hr tablet  At risk for osteoporosis  Class 3 severe obesity with serious comorbidity and body mass index (BMI) of 40.0 to 44.9 in adult, unspecified obesity type (HCC)  PLAN:  Vitamin D Deficiency Daniel [name] was informed that low vitamin D levels contributes to fatigue and are associated with obesity, breast, and colon cancer. Daniel [name] agrees to discontinue prescription Vit D and start OTC Vit D 5,000 IU daily and will follow up for routine testing of vitamin D, at least 2-3 times per year. He was informed of the risk of over-replacement of vitamin D and  agrees to not increase his dose unless he discusses this with [name] first. Daniel [name] agrees to follow up with our clinic in 3 weeks.  At risk for osteopenia and osteoporosis Daniel [name] was given extended  (15 minutes) osteoporosis prevention counseling today. Daniel [name] is at risk for osteopenia and osteoporsis due to his vitamin D deficiency. He was encouraged to take his vitamin D and follow his higher calcium diet and increase strengthening exercise to help strengthen his bones and decrease his risk of osteopenia and osteoporosis.  Depression with Emotional Eating Behaviors We discussed behavior modification techniques today to help Daniel [name] deal with his emotional eating and depression. Daniel [name] agrees to continue taking bupropion 150 mg qd #30 and we will refill for 1 month. Daniel [name] agrees to follow up with our clinic in 3 weeks.  Obesity Daniel [name] is currently in the action stage of change. As such, his goal is to continue with weight loss efforts He has agreed to keep a food journal with 350-500 calories and 35 grams of protein at lunch daily and follow the Category 3 plan Daniel [name] has been instructed to work up to a goal of 150 minutes of combined cardio and strengthening exercise per week for weight loss and overall health benefits. We discussed the following Behavioral Modification Strategies today: work on meal planning and easy cooking plans and planning for success   Daniel [name] has agreed to follow up with our clinic in 3 weeks. He was informed of the importance of frequent follow up visits to maximize his success with intensive lifestyle modifications for his multiple health conditions.   OBESITY BEHAVIORAL INTERVENTION VISIT  Today's visit was # 33   Starting weight: 309 lbs Starting date: 12/24/16 Today's weight : 279 lbs Today's date: 08/19/2018 Total lbs lost to date: 30    ASK: We discussed the diagnosis of obesity with Daniel [name] today and Daniel [name] agreed to give [name] permission to discuss  obesity behavioral modification therapy today.  ASSESS: Daniel [name] has the diagnosis of obesity and his BMI today is 42.43 Daniel [name]  is in the action stage of change   ADVISE: Daniel [name] was educated on the multiple health risks of obesity as well as the benefit of weight loss to improve his health. He was advised of the need for long term treatment and the importance of lifestyle modifications.  AGREE: Multiple dietary modification options and treatment options were discussed and  Rodolph agreed to the above obesity treatment plan.  Trude Mcburney, am acting as transcriptionist for Alois Cliche, PA-C I, Alois Cliche, PA-C have reviewed above note and agree with its content

## 2018-09-02 ENCOUNTER — Encounter: Payer: Self-pay | Admitting: Family Medicine

## 2018-09-02 ENCOUNTER — Ambulatory Visit (INDEPENDENT_AMBULATORY_CARE_PROVIDER_SITE_OTHER): Payer: 59 | Admitting: Family Medicine

## 2018-09-02 VITALS — BP 124/80 | HR 64 | Temp 98.4°F | Ht 69.0 in | Wt 288.4 lb

## 2018-09-02 DIAGNOSIS — Z125 Encounter for screening for malignant neoplasm of prostate: Secondary | ICD-10-CM | POA: Diagnosis not present

## 2018-09-02 DIAGNOSIS — Z23 Encounter for immunization: Secondary | ICD-10-CM | POA: Diagnosis not present

## 2018-09-02 DIAGNOSIS — Z0000 Encounter for general adult medical examination without abnormal findings: Secondary | ICD-10-CM | POA: Diagnosis not present

## 2018-09-02 LAB — POC URINALSYSI DIPSTICK (AUTOMATED)
Bilirubin, UA: NEGATIVE
GLUCOSE UA: NEGATIVE
Ketones, UA: NEGATIVE
Leukocytes, UA: NEGATIVE
Nitrite, UA: NEGATIVE
Protein, UA: POSITIVE — AB
RBC UA: NEGATIVE
SPEC GRAV UA: 1.025 (ref 1.010–1.025)
Urobilinogen, UA: 1 E.U./dL
pH, UA: 6 (ref 5.0–8.0)

## 2018-09-02 LAB — T3, FREE: T3 FREE: 3.8 pg/mL (ref 2.3–4.2)

## 2018-09-02 LAB — PSA: PSA: 0.15 ng/mL (ref 0.10–4.00)

## 2018-09-02 LAB — TSH: TSH: 2.27 u[IU]/mL (ref 0.35–4.50)

## 2018-09-02 LAB — T4, FREE: Free T4: 0.77 ng/dL (ref 0.60–1.60)

## 2018-09-02 MED ORDER — MONTELUKAST SODIUM 10 MG PO TABS: 10.0000 mg | ORAL_TABLET | Freq: Every day | ORAL | 3 refills | Status: DC

## 2018-09-02 MED ORDER — LEVOCETIRIZINE DIHYDROCHLORIDE 5 MG PO TABS: 5.0000 mg | ORAL_TABLET | Freq: Every evening | ORAL | 3 refills | Status: DC

## 2018-09-02 MED ORDER — ALBUTEROL SULFATE HFA 108 (90 BASE) MCG/ACT IN AERS: INHALATION_SPRAY | RESPIRATORY_TRACT | 5 refills | Status: DC

## 2018-09-02 MED FILL — VENTOLIN HFA 90 MCG INHALER: 108 (90 BAS | 17 days supply | Qty: 18 | Fill #0

## 2018-09-02 NOTE — Progress Notes (Signed)
   Subjective:    Patient ID: Daniel [name], male    DOB: 1971-07-19, 47 y.o.   MRN: [id number]  HPI Here for a well exam. He feels well except for his right knee. This started to have some pain intermittently a few months ago and he feels that it has lost some stability. It does not buckle on him but he feels the need to walk carefully.    Review of Systems  Constitutional: Negative.   HENT: Negative.   Eyes: Negative.   Respiratory: Negative.   Cardiovascular: Negative.   Gastrointestinal: Negative.   Genitourinary: Negative.   Musculoskeletal: Positive for arthralgias.  Skin: Negative.   Neurological: Negative.   Psychiatric/Behavioral: Negative.        Objective:   Physical Exam  Constitutional: He is oriented to person, place, and time. No distress.  Morbidly obese   HENT:  Head: Normocephalic and atraumatic.  Right Ear: External ear normal.  Left Ear: External ear normal.  Nose: Nose normal.  Mouth/Throat: Oropharynx is clear and moist. No oropharyngeal exudate.  Eyes: Pupils are equal, round, and reactive to light. Conjunctivae and EOM are normal. Right eye exhibits no discharge. Left eye exhibits no discharge. No scleral icterus.  Neck: Neck supple. No JVD present. No tracheal deviation present. No thyromegaly present.  Cardiovascular: Normal rate, regular rhythm, normal heart sounds and intact distal pulses. Exam reveals no gallop and no friction rub.  No murmur heard. Pulmonary/Chest: Effort normal and breath sounds normal. No respiratory distress. He has no wheezes. He has no rales. He exhibits no tenderness.  Abdominal: Soft. Bowel sounds are normal. He exhibits no distension and no mass. There is no tenderness. There is no rebound and no guarding.  Genitourinary: Rectum normal, prostate normal and penis normal. Rectal exam shows guaiac negative stool. No penile tenderness.  Musculoskeletal: Normal range of motion. He exhibits no edema.  The right knee has no  swelling and full ROM. No crepitus. He is mildly tender along the medial joint space   Lymphadenopathy:    He has no cervical adenopathy.  Neurological: He is alert and oriented to person, place, and time. He has normal reflexes. He displays normal reflexes. No cranial nerve deficit. He exhibits normal muscle tone. Coordination normal.  Skin: Skin is warm and dry. No rash noted. He is not diaphoretic. No erythema. No pallor.  Psychiatric: He has a normal mood and affect. His behavior is normal. Judgment and thought content normal.          Assessment & Plan:  Well exam. We discussed diet and exercise. Get fasting labs. He seems to have some early degenerative arthritis in the knee. Losing weight will help with this. He can wear an elastic support sleeve prn. [name] Crane, MD

## 2018-09-03 ENCOUNTER — Encounter: Payer: Self-pay | Admitting: Family Medicine

## 2018-09-04 ENCOUNTER — Encounter: Payer: Self-pay | Admitting: *Deleted

## 2018-09-04 NOTE — Telephone Encounter (Signed)
Nothing to worry about. I think it may be a reagent problem at the lab because almost of of my patients' UAs have been coming back positive

## 2018-09-04 NOTE — Telephone Encounter (Signed)
Dr. Fry please advise. Thanks  

## 2018-09-08 MED FILL — BUPROPION SR 150 MG TABLET: 150 | 30 days supply | Qty: 30 | Fill #0

## 2018-09-09 ENCOUNTER — Encounter (INDEPENDENT_AMBULATORY_CARE_PROVIDER_SITE_OTHER): Payer: Self-pay | Admitting: Physician Assistant

## 2018-09-09 ENCOUNTER — Ambulatory Visit (INDEPENDENT_AMBULATORY_CARE_PROVIDER_SITE_OTHER): Payer: 59 | Admitting: Physician Assistant

## 2018-09-09 VITALS — BP 128/82 | HR 65 | Temp 97.6°F | Ht 69.0 in | Wt 281.0 lb

## 2018-09-09 DIAGNOSIS — Z9189 Other specified personal risk factors, not elsewhere classified: Secondary | ICD-10-CM

## 2018-09-09 DIAGNOSIS — I10 Essential (primary) hypertension: Secondary | ICD-10-CM

## 2018-09-09 DIAGNOSIS — Z6841 Body Mass Index (BMI) 40.0 and over, adult: Secondary | ICD-10-CM | POA: Diagnosis not present

## 2018-09-09 DIAGNOSIS — F3289 Other specified depressive episodes: Secondary | ICD-10-CM

## 2018-09-09 MED ORDER — BUPROPION HCL ER (SR) 150 MG PO TB12: 150.0000 mg | ORAL_TABLET | Freq: Every day | ORAL | 0 refills | Status: DC

## 2018-09-09 MED ORDER — CHLORTHALIDONE 25 MG PO TABS: 25.0000 mg | ORAL_TABLET | Freq: Every day | ORAL | 0 refills | Status: DC

## 2018-09-09 MED FILL — CHLORTHALIDONE 25 MG TAB: 25 | 30 days supply | Qty: 30 | Fill #0

## 2018-09-14 NOTE — Progress Notes (Signed)
Office: [phone]  /  Fax: [phone]   HPI:   Chief Complaint: OBESITY Daniel [name] is here to discuss his progress with his obesity treatment plan. He is on the Category 3 plan and lunch food journal of 350 to 500 calories and 35 grams of protein and is following his eating plan approximately 80 % of the time. He states he is doing cardio and lifting weights for 60 minutes 5 times per week. Daniel [name] reports that he has been eating steak almost every day. He also states that he has been lifting weights more often.  His weight is 281 lb (127.5 kg) today and has had a weight gain of 2 pounds over a period of 3 weeks since his last visit. He has lost 28 lbs since starting treatment with [name].  Hypertension Daniel [name] is a 47 y.o. male with hypertension. Daniel [name] denies chest pain. He is working on weight loss to help control his blood pressure with the goal of decreasing his risk of heart attack and stroke. Daniel [name] is on chlorthalidone 25mg  and dilitazem 240mg . His blood pressure was normal today.  At risk for cardiovascular disease Daniel [name] is at a higher than average risk for cardiovascular disease due to hypertension and obesity. He currently denies any chest pain.  Depression with emotional eating behaviors Daniel [name] is on Bupropion 150mg  and his blood pressure is normal. He denies cravings or emotional eating.  ALLERGIES: Allergies  Allergen Reactions  . Bee Venom Anaphylaxis  . Shellfish Allergy Anaphylaxis    MEDICATIONS: Current Outpatient Medications on File Prior to Visit  Medication Sig Dispense Refill  . albuterol (VENTOLIN HFA) 108 (90 Base) MCG/ACT inhaler INHALE 2 PUFFS INTO THE LUNGS EVERY 4 HOURS AS NEEDED FOR WHEEZING OR SHORTNESS OF BREATH. 18 g 5  . diltiazem (CARDIZEM LA) 240 MG 24 hr tablet Take 1 tablet (240 mg total) by mouth daily. 30 tablet 0  . EPINEPHrine (EPIPEN 2-PAK) 0.3 mg/0.3 mL SOAJ injection Inject 0.3 mLs (0.3 mg total) into the muscle once. 1 Device 11  .  levocetirizine (XYZAL) 5 MG tablet Take 1 tablet (5 mg total) by mouth every evening. 90 tablet 3  . montelukast (SINGULAIR) 10 MG tablet Take 1 tablet (10 mg total) by mouth at bedtime. 90 tablet 3  . Multiple Vitamin (MULTIVITAMIN WITH MINERALS) TABS Take 1 tablet by mouth daily.    [name] omeprazole (PRILOSEC) 20 MG capsule TAKE 1 CAPSULE BY MOUTH ONCE DAILY 90 capsule 3  . polyethylene glycol powder (GLYCOLAX/MIRALAX) powder Take 17 g by mouth daily as needed. 3350 g 1  . Polyvinyl Alcohol-Povidone (CLEAR EYES ALL SEASONS) 5-6 MG/ML SOLN Apply 1 drop to eye once as needed. Reported on 02/01/2016    . Vitamin D, Ergocalciferol, (DRISDOL) 50000 units CAPS capsule Take 1 capsule (50,000 Units total) by mouth every 7 (seven) days. 4 capsule 0   No current facility-administered medications on file prior to visit.     PAST MEDICAL HISTORY: Past Medical History:  Diagnosis Date  . Asthma   . GERD (gastroesophageal reflux disease)   . Hypertension     PAST SURGICAL HISTORY: No past surgical history on file.  SOCIAL HISTORY: Social History   Tobacco Use  . Smoking status: Never Smoker  . Smokeless tobacco: Never Used  Substance Use Topics  . Alcohol use: No    Alcohol/week: 0.0 standard drinks  . Drug use: No    FAMILY HISTORY: Family History  Problem Relation Age of Onset  . Hyperlipidemia  Unknown   . Hypertension Unknown   . Stroke Unknown   . Hypertension Mother   . Hyperlipidemia Mother   . Obesity Mother   . Sudden death Father     ROS: Review of Systems  Constitutional: Negative for weight loss.  Cardiovascular: Negative for chest pain.  Psychiatric/Behavioral: Positive for depression.    PHYSICAL EXAM: Blood pressure 128/82, pulse 65, temperature 97.6 F (36.4 C), temperature source Oral, height 5\' 9"  (1.753 m), weight 281 lb (127.5 kg), SpO2 100 %. Body mass index is 41.5 kg/m. Physical Exam  Constitutional: He is oriented to person, place, and time. He appears  well-developed and well-nourished.  Cardiovascular: Normal rate.  Pulmonary/Chest: Effort normal.  Musculoskeletal: Normal range of motion.  Neurological: He is alert and oriented to person, place, and time.  Skin: Skin is warm and dry.  Psychiatric: He has a normal mood and affect. His behavior is normal.  Vitals reviewed.   RECENT LABS AND TESTS: BMET    Component Value Date/Time   NA 141 08/05/2018 1301   K 3.9 08/05/2018 1301   CL 102 08/05/2018 1301   CO2 24 08/05/2018 1301   GLUCOSE 81 08/05/2018 1301   GLUCOSE 103 (H) 11/07/2015 1454   BUN 16 08/05/2018 1301   CREATININE 1.20 08/05/2018 1301   CALCIUM 9.3 08/05/2018 1301   GFRNONAA 73 08/05/2018 1301   GFRAA 84 08/05/2018 1301   Lab Results  Component Value Date   HGBA1C 5.4 08/05/2018   HGBA1C 5.4 11/11/2017   HGBA1C 5.3 08/06/2017   HGBA1C 5.6 04/08/2017   HGBA1C 5.5 12/24/2016   Lab Results  Component Value Date   INSULIN 21.7 08/05/2018   INSULIN 23.2 11/11/2017   INSULIN 27.6 (H) 08/06/2017   INSULIN 28.9 (H) 04/08/2017   INSULIN 25.8 (H) 12/24/2016   CBC    Component Value Date/Time   WBC 6.6 12/24/2016 1021   WBC 10.2 11/07/2015 1454   RBC 5.47 12/24/2016 1021   RBC 5.67 11/07/2015 1454   HGB 15.2 12/24/2016 1021   HCT 44.3 12/24/2016 1021   PLT 300.0 11/07/2015 1454   MCV 81 12/24/2016 1021   MCH 27.8 12/24/2016 1021   MCH 28.0 07/18/2012 2030   MCHC 34.3 12/24/2016 1021   MCHC 33.3 11/07/2015 1454   RDW 14.6 12/24/2016 1021   LYMPHSABS 2.7 12/24/2016 1021   MONOABS 0.9 11/07/2015 1454   EOSABS 0.2 12/24/2016 1021   BASOSABS 0.1 12/24/2016 1021   Iron/TIBC/Ferritin/ %Sat No results found for: IRON, TIBC, FERRITIN, IRONPCTSAT Lipid Panel     Component Value Date/Time   CHOL 182 08/05/2018 1301   TRIG 74 08/05/2018 1301   HDL 42 08/05/2018 1301   CHOLHDL 5 03/17/2015 0841   VLDL 22.8 03/17/2015 0841   LDLCALC 125 (H) 08/05/2018 1301   Hepatic Function Panel     Component Value  Date/Time   PROT 7.6 08/05/2018 1301   ALBUMIN 4.4 08/05/2018 1301   AST 26 08/05/2018 1301   ALT 40 08/05/2018 1301   ALKPHOS 66 08/05/2018 1301   BILITOT 0.3 08/05/2018 1301   BILIDIR 0.1 11/07/2015 1454      Component Value Date/Time   TSH 2.27 09/02/2018 0913   TSH 1.370 12/24/2016 1021   TSH 1.39 03/17/2015 0841   Results for SARON, TWEED (MRN [id number]) as of 09/14/2018 12:03  Ref. Range 08/05/2018 13:01  Vitamin D, 25-Hydroxy Latest Ref Range: 30.0 - 100.0 ng/mL 54.7   ASSESSMENT AND PLAN: Essential hypertension - Plan: chlorthalidone (  HYGROTON) 25 MG tablet  Other depression - with emotional eating - Plan: buPROPion (WELLBUTRIN SR) 150 MG 12 hr tablet  At risk for heart disease  Class 3 severe obesity with serious comorbidity and body mass index (BMI) of 40.0 to 44.9 in adult, unspecified obesity type (HCC)  PLAN:  Hypertension We discussed sodium restriction, working on healthy weight loss, and a regular exercise program as the means to achieve improved blood pressure control. Yonael agreed with this plan and agreed to follow up as directed. We will continue to monitor his blood pressure as well as his progress with the above lifestyle modifications. He will continue chlorthalidone 25mg  qd #30 with no refills and dilitazem 240mg  qd #30 with no refills and and will watch for signs of hypotension as he continues his lifestyle modifications. He agrees to follow up in 3 weeks.  Cardiovascular risk counseling Lasean was given extended (15 minutes) coronary artery disease prevention counseling today. He is 47 y.o. male and has risk factors for heart disease including hypertension and obesity. We discussed intensive lifestyle modifications today with an emphasis on specific weight loss instructions and strategies. Pt was also informed of the importance of increasing exercise and decreasing saturated fats to help prevent heart disease.  Depression with Emotional Eating  Behaviors We discussed behavior modification techniques today to help Reeves deal with his emotional eating and depression. He has agreed to take bupropion SR 150 mg qd #30 with no refills and agreed to follow up as directed in 3 weeks.  Obesity Aadarsh is currently in the action stage of change. As such, his goal is to continue with weight loss efforts. He has agreed to follow the Category 3 plan and to keep a food journal of 350 to 500 calories and 35 grams of protein for lunch. Farley has been instructed to work up to a goal of 150 minutes of combined cardio and strengthening exercise per week for weight loss and overall health benefits. We discussed the following Behavioral Modification Strategies today: work on meal planning and easy cooking plans and planning for success.  Roen has agreed to follow up with our clinic in 3 weeks. He was informed of the importance of frequent follow up visits to maximize his success with intensive lifestyle modifications for his multiple health conditions.   OBESITY BEHAVIORAL INTERVENTION VISIT  Today's visit was # 34  Starting weight: 309 lbs Starting date: 12/24/16 Today's weight : Weight: 281 lb (127.5 kg)  Today's date: 09/09/2018 Total lbs lost to date: 73  ASK: We discussed the diagnosis of obesity with [name] today and Bostyn agreed to give [name] permission to discuss obesity behavioral modification therapy today.  ASSESS: Hermilo has the diagnosis of obesity and his BMI today is 41.48. Kuron is in the action stage of change.   ADVISE: Rome was educated on the multiple health risks of obesity as well as the benefit of weight loss to improve his health. He was advised of the need for long term treatment and the importance of lifestyle modifications to improve his current health and to decrease his risk of future health problems.  AGREE: Multiple dietary modification options and treatment options were discussed and Amit agreed to  follow the recommendations documented in the above note.  ARRANGE: Quency was educated on the importance of frequent visits to treat obesity as outlined per CMS and USPSTF guidelines and agreed to schedule his next follow up appointment today.  IKirke Corin, am acting as Energy manager  for Alois Cliche, PA-C I, Alois Cliche, PA-C have reviewed above note and agree with its content

## 2018-09-17 ENCOUNTER — Other Ambulatory Visit (INDEPENDENT_AMBULATORY_CARE_PROVIDER_SITE_OTHER): Payer: Self-pay | Admitting: Physician Assistant

## 2018-09-17 DIAGNOSIS — I10 Essential (primary) hypertension: Secondary | ICD-10-CM

## 2018-09-22 MED FILL — DILTIAZEM ER 240 MG TABLET: 240 | 30 days supply | Qty: 30 | Fill #0

## 2018-09-28 ENCOUNTER — Ambulatory Visit (INDEPENDENT_AMBULATORY_CARE_PROVIDER_SITE_OTHER): Payer: 59 | Admitting: Family Medicine

## 2018-09-28 ENCOUNTER — Encounter (INDEPENDENT_AMBULATORY_CARE_PROVIDER_SITE_OTHER): Payer: Self-pay | Admitting: Family Medicine

## 2018-09-28 DIAGNOSIS — M79605 Pain in left leg: Secondary | ICD-10-CM | POA: Diagnosis not present

## 2018-09-28 MED ORDER — ETODOLAC 400 MG PO TABS: 400.0000 mg | ORAL_TABLET | Freq: Two times a day (BID) | ORAL | 3 refills | Status: DC | PRN

## 2018-09-28 MED FILL — ETODOLAC 400 MG TABLET: 400 | 30 days supply | Qty: 60 | Fill #0

## 2018-09-28 NOTE — Progress Notes (Signed)
Office Visit Note   Patient: Daniel [name]           Date of Birth: 1970/12/26           MRN: [id number] Visit Date: 09/28/2018 Requested by: Nelwyn Salisbury, MD [address] [place], [place] [address] PCP: Nelwyn Salisbury, MD  Subjective: Chief Complaint  Patient presents with  . Left Knee - Pain    Progressively worsening pain x 1 month.  NKI. Knee is stiff in the mornings.  +popping, -swelling, -locking.  Transition from sitting to standing "is a problem."    HPI: He is here with left knee pain.  Symptoms started about 4 or 5 months ago.  No definite injury, one day he noticed pain at night in the lateral aspect of his knee and then it was swollen.  He has had ongoing swelling and intermittent pain since then.  A couple months before, his right knee hurt for a few days but it got better with anti-inflammatories over-the-counter.  He is using a knee brace on the left knee with some relief.  He has been working part-time at [employer], he wonders whether that might be aggravating his knee.  He does not recall an injury at [facility].  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              ROS: Otherwise noncontributory  Objective: Vital Signs: There were no vitals taken for this visit.  Physical Exam:  Left knee: 1-2+ effusion with no warmth or erythema.  Full flexion and extension, ligaments are stable.  Very tender on the posterior lateral joint line with a palpable click on McMurray's.  Imaging: None today.  Assessment & Plan: 1.  Left knee pain with effusion, suspicious for degenerative lateral meniscus tear -Discussed options with him and elected to try Lodine, ice applications, work restrictions for a few weeks.  If symptoms persist we could aspirate and inject one time.  If still no relief, then x-rays and MRI scan followed by surgical consult if indicated.   Follow-Up Instructions: No follow-ups on file.       Procedures: None today.   PMFS History: Patient  Active Problem List   Diagnosis Date Noted  . Depression 01/06/2018  . Essential hypertension 08/11/2017  . Class 3 obesity with serious comorbidity and body mass index (BMI) of 40.0 to 44.9 in adult 08/11/2017  . Class 3 obesity without serious comorbidity with body mass index (BMI) of 40.0 to 44.9 in adult 06/30/2017  . Prediabetes 04/15/2017  . Other hyperlipidemia 03/25/2017  . Vitamin D deficiency 03/25/2017  . Insulin resistance 03/25/2017  . Hypertension 01/20/2017  . Class 3 obesity without serious comorbidity with body mass index (BMI) of 45.0 to 49.9 in adult 01/20/2017  . Morbid obesity (HCC) 12/24/2016  . Shortness of breath on exertion 12/24/2016  . Other fatigue 12/24/2016  . CN (constipation) 02/03/2015  . HTN (hypertension) 03/03/2013  . ABDOMINAL PAIN, LEFT UPPER QUADRANT 11/02/2010  . PLANTAR FASCIITIS 05/14/2010  . GASTRITIS 10/26/2008  . FEVER BLISTER 07/22/2008  . ASTHMA 07/22/2008  . GERD 07/22/2008   Past Medical History:  Diagnosis Date  . Asthma   . GERD (gastroesophageal reflux disease)   . Hypertension     Family History  Problem Relation Age of Onset  . Hyperlipidemia Unknown   . Hypertension Unknown   . Stroke Unknown   . Hypertension Mother   . Hyperlipidemia Mother   . Obesity Mother   .  Sudden death Father     History reviewed. No pertinent surgical history. Social History   Occupational History  . Occupation: Production assistant, radio: [employer]  Tobacco Use  . Smoking status: Never Smoker  . Smokeless tobacco: Never Used  Substance and Sexual Activity  . Alcohol use: No    Alcohol/week: 0.0 standard drinks  . Drug use: No  . Sexual activity: Yes    Birth control/protection: None

## 2018-09-30 ENCOUNTER — Ambulatory Visit (INDEPENDENT_AMBULATORY_CARE_PROVIDER_SITE_OTHER): Payer: 59 | Admitting: Physician Assistant

## 2018-09-30 ENCOUNTER — Encounter (INDEPENDENT_AMBULATORY_CARE_PROVIDER_SITE_OTHER): Payer: Self-pay | Admitting: Physician Assistant

## 2018-09-30 VITALS — BP 125/78 | HR 66 | Temp 98.5°F | Ht 69.0 in | Wt 283.0 lb

## 2018-09-30 DIAGNOSIS — E559 Vitamin D deficiency, unspecified: Secondary | ICD-10-CM | POA: Diagnosis not present

## 2018-09-30 DIAGNOSIS — Z6841 Body Mass Index (BMI) 40.0 and over, adult: Secondary | ICD-10-CM | POA: Diagnosis not present

## 2018-09-30 NOTE — Progress Notes (Signed)
Office: [phone]  /  Fax: [phone]   HPI:   Chief Complaint: OBESITY Daniel [name] is here to discuss his progress with his obesity treatment plan. He is on the keep a food journal with 350-500 calories and 35 grams of protein daily and follow the Category 3 plan and is following his eating plan approximately 70 % of the time. He states he is lifting weights for 60 minutes 3 times per week. Daniel [name] reports that his appetite has decrease since injuring his knee. He is only eating 1-2 meals daily. He is ready to get back on track.  His weight is 283 lb (128.4 kg) today and has gained 2 pounds since his last visit. He has lost 26 lbs since starting treatment with [name].  Vitamin D Deficiency Daniel [name] has a diagnosis of vitamin D deficiency. He is currently taking prescription Vit D and denies nausea, vomiting or muscle weakness.  ALLERGIES: Allergies  Allergen Reactions  . Bee Venom Anaphylaxis  . Shellfish Allergy Anaphylaxis    MEDICATIONS: Current Outpatient Medications on File Prior to Visit  Medication Sig Dispense Refill  . albuterol (VENTOLIN HFA) 108 (90 Base) MCG/ACT inhaler INHALE 2 PUFFS INTO THE LUNGS EVERY 4 HOURS AS NEEDED FOR WHEEZING OR SHORTNESS OF BREATH. 18 g 5  . buPROPion (WELLBUTRIN SR) 150 MG 12 hr tablet Take 1 tablet (150 mg total) by mouth daily. 30 tablet 0  . chlorthalidone (HYGROTON) 25 MG tablet Take 1 tablet (25 mg total) by mouth daily. 30 tablet 0  . diltiazem (CARDIZEM LA) 240 MG 24 hr tablet TAKE 1 TABLET (240 MG TOTAL) BY MOUTH DAILY. 30 tablet 0  . EPINEPHrine (EPIPEN 2-PAK) 0.3 mg/0.3 mL SOAJ injection Inject 0.3 mLs (0.3 mg total) into the muscle once. 1 Device 11  . etodolac (LODINE) 400 MG tablet Take 1 tablet (400 mg total) by mouth 2 (two) times daily as needed. 60 tablet 3  . levocetirizine (XYZAL) 5 MG tablet Take 1 tablet (5 mg total) by mouth every evening. 90 tablet 3  . montelukast (SINGULAIR) 10 MG tablet Take 1 tablet (10 mg total) by mouth  at bedtime. 90 tablet 3  . Multiple Vitamin (MULTIVITAMIN WITH MINERALS) TABS Take 1 tablet by mouth daily.    [name] omeprazole (PRILOSEC) 20 MG capsule TAKE 1 CAPSULE BY MOUTH ONCE DAILY 90 capsule 3  . polyethylene glycol powder (GLYCOLAX/MIRALAX) powder Take 17 g by mouth daily as needed. 3350 g 1  . Polyvinyl Alcohol-Povidone (CLEAR EYES ALL SEASONS) 5-6 MG/ML SOLN Apply 1 drop to eye once as needed. Reported on 02/01/2016    . Vitamin D, Ergocalciferol, (DRISDOL) 50000 units CAPS capsule Take 1 capsule (50,000 Units total) by mouth every 7 (seven) days. 4 capsule 0   No current facility-administered medications on file prior to visit.     PAST MEDICAL HISTORY: Past Medical History:  Diagnosis Date  . Asthma   . GERD (gastroesophageal reflux disease)   . Hypertension     PAST SURGICAL HISTORY: No past surgical history on file.  SOCIAL HISTORY: Social History   Tobacco Use  . Smoking status: Never Smoker  . Smokeless tobacco: Never Used  Substance Use Topics  . Alcohol use: No    Alcohol/week: 0.0 standard drinks  . Drug use: No    FAMILY HISTORY: Family History  Problem Relation Age of Onset  . Hyperlipidemia Unknown   . Hypertension Unknown   . Stroke Unknown   . Hypertension Mother   . Hyperlipidemia Mother   .  Obesity Mother   . Sudden death Father     ROS: Review of Systems  Constitutional: Negative for weight loss.  Gastrointestinal: Negative for nausea and vomiting.  Musculoskeletal:       Negative muscle weakness    PHYSICAL EXAM: Blood pressure 125/78, pulse 66, temperature 98.5 F (36.9 C), temperature source Oral, height 5\' 9"  (1.753 m), weight 283 lb (128.4 kg), SpO2 98 %. Body mass index is 41.79 kg/m. Physical Exam  Constitutional: He is oriented to person, place, and time. He appears well-developed and well-nourished.  Cardiovascular: Normal rate.  Pulmonary/Chest: Effort normal.  Musculoskeletal: Normal range of motion.  Neurological: He is  oriented to person, place, and time.  Skin: Skin is warm and dry.  Psychiatric: He has a normal mood and affect. His behavior is normal.  Vitals reviewed.   RECENT LABS AND TESTS: BMET    Component Value Date/Time   NA 141 08/05/2018 1301   K 3.9 08/05/2018 1301   CL 102 08/05/2018 1301   CO2 24 08/05/2018 1301   GLUCOSE 81 08/05/2018 1301   GLUCOSE 103 (H) 11/07/2015 1454   BUN 16 08/05/2018 1301   CREATININE 1.20 08/05/2018 1301   CALCIUM 9.3 08/05/2018 1301   GFRNONAA 73 08/05/2018 1301   GFRAA 84 08/05/2018 1301   Lab Results  Component Value Date   HGBA1C 5.4 08/05/2018   HGBA1C 5.4 11/11/2017   HGBA1C 5.3 08/06/2017   HGBA1C 5.6 04/08/2017   HGBA1C 5.5 12/24/2016   Lab Results  Component Value Date   INSULIN 21.7 08/05/2018   INSULIN 23.2 11/11/2017   INSULIN 27.6 (H) 08/06/2017   INSULIN 28.9 (H) 04/08/2017   INSULIN 25.8 (H) 12/24/2016   CBC    Component Value Date/Time   WBC 6.6 12/24/2016 1021   WBC 10.2 11/07/2015 1454   RBC 5.47 12/24/2016 1021   RBC 5.67 11/07/2015 1454   HGB 15.2 12/24/2016 1021   HCT 44.3 12/24/2016 1021   PLT 300.0 11/07/2015 1454   MCV 81 12/24/2016 1021   MCH 27.8 12/24/2016 1021   MCH 28.0 07/18/2012 2030   MCHC 34.3 12/24/2016 1021   MCHC 33.3 11/07/2015 1454   RDW 14.6 12/24/2016 1021   LYMPHSABS 2.7 12/24/2016 1021   MONOABS 0.9 11/07/2015 1454   EOSABS 0.2 12/24/2016 1021   BASOSABS 0.1 12/24/2016 1021   Iron/TIBC/Ferritin/ %Sat No results found for: IRON, TIBC, FERRITIN, IRONPCTSAT Lipid Panel     Component Value Date/Time   CHOL 182 08/05/2018 1301   TRIG 74 08/05/2018 1301   HDL 42 08/05/2018 1301   CHOLHDL 5 03/17/2015 0841   VLDL 22.8 03/17/2015 0841   LDLCALC 125 (H) 08/05/2018 1301   Hepatic Function Panel     Component Value Date/Time   PROT 7.6 08/05/2018 1301   ALBUMIN 4.4 08/05/2018 1301   AST 26 08/05/2018 1301   ALT 40 08/05/2018 1301   ALKPHOS 66 08/05/2018 1301   BILITOT 0.3  08/05/2018 1301   BILIDIR 0.1 11/07/2015 1454      Component Value Date/Time   TSH 2.27 09/02/2018 0913   TSH 1.370 12/24/2016 1021   TSH 1.39 03/17/2015 0841  Results for Daniel [name], Daniel [name] (MRN [id number]) as of 09/30/2018 18:13  Ref. Range 08/05/2018 13:01  Vitamin D, 25-Hydroxy Latest Ref Range: 30.0 - 100.0 ng/mL 54.7    ASSESSMENT AND PLAN: Vitamin D deficiency  Class 3 severe obesity with serious comorbidity and body mass index (BMI) of 40.0 to 44.9 in adult, unspecified obesity  type Daniel [name])  PLAN:  Vitamin D Deficiency Daniel [name] was informed that low vitamin D levels contributes to fatigue and are associated with obesity, breast, and colon cancer. Daniel [name] agrees to continue taking prescription Vit D @50 ,000 IU every week and will follow up for routine testing of vitamin D, at least 2-3 times per year. He was informed of the risk of over-replacement of vitamin D and agrees to not increase his dose unless he discusses this with [name] first. Daniel [name] agrees to follow up with our clinic in 2 weeks.  I spent > than 50% of the 15 minute visit on counseling as documented in the note.  Obesity Daniel [name] is currently in the action stage of change. As such, his goal is to continue with weight loss efforts He has agreed to follow the Category 3 plan Daniel [name] has been instructed to work up to a goal of 150 minutes of combined cardio and strengthening exercise per week for weight loss and overall health benefits. We discussed the following Behavioral Modification Strategies today: increasing lean protein intake and no skipping meals   Daniel [name] has agreed to follow up with our clinic in 2 weeks. He was informed of the importance of frequent follow up visits to maximize his success with intensive lifestyle modifications for his multiple health conditions.   OBESITY BEHAVIORAL INTERVENTION VISIT  Today's visit was # 35   Starting weight: 309 lbs Starting date: 12/24/16 Today's weight : 283 lbs  Today's  date: 09/30/2018 Total lbs lost to date: 8    ASK: We discussed the diagnosis of obesity with Daniel [name] today and Daniel [name] agreed to give [name] permission to discuss obesity behavioral modification therapy today.  ASSESS: Daniel [name] has the diagnosis of obesity and his BMI today is 41.77 Daniel [name] is in the action stage of change   ADVISE: Daniel [name] was educated on the multiple health risks of obesity as well as the benefit of weight loss to improve his health. He was advised of the need for long term treatment and the importance of lifestyle modifications.  AGREE: Multiple dietary modification options and treatment options were discussed and  [place] agreed to the above obesity treatment plan.  Daniel [name], am acting as transcriptionist for Daniel Cliche, PA-C I, Daniel Cliche, PA-C have reviewed above note and agree with its content

## 2018-10-01 DIAGNOSIS — S83242A Other tear of medial meniscus, current injury, left knee, initial encounter: Secondary | ICD-10-CM | POA: Diagnosis not present

## 2018-10-05 ENCOUNTER — Encounter (INDEPENDENT_AMBULATORY_CARE_PROVIDER_SITE_OTHER): Payer: Self-pay | Admitting: Family Medicine

## 2018-10-07 DIAGNOSIS — M25562 Pain in left knee: Secondary | ICD-10-CM | POA: Diagnosis not present

## 2018-10-09 DIAGNOSIS — S83242D Other tear of medial meniscus, current injury, left knee, subsequent encounter: Secondary | ICD-10-CM | POA: Diagnosis not present

## 2018-10-09 MED FILL — CELECOXIB 200 MG CAP: 200 | 90 days supply | Qty: 90 | Fill #0

## 2018-10-15 ENCOUNTER — Ambulatory Visit (INDEPENDENT_AMBULATORY_CARE_PROVIDER_SITE_OTHER): Payer: 59 | Admitting: Physician Assistant

## 2018-10-15 ENCOUNTER — Encounter (INDEPENDENT_AMBULATORY_CARE_PROVIDER_SITE_OTHER): Payer: Self-pay | Admitting: Physician Assistant

## 2018-10-15 VITALS — BP 126/81 | HR 66 | Temp 97.7°F | Ht 69.0 in | Wt 282.0 lb

## 2018-10-15 DIAGNOSIS — I10 Essential (primary) hypertension: Secondary | ICD-10-CM | POA: Diagnosis not present

## 2018-10-15 DIAGNOSIS — Z6841 Body Mass Index (BMI) 40.0 and over, adult: Secondary | ICD-10-CM

## 2018-10-15 DIAGNOSIS — E66813 Obesity, class 3: Secondary | ICD-10-CM

## 2018-10-15 DIAGNOSIS — F3289 Other specified depressive episodes: Secondary | ICD-10-CM

## 2018-10-15 DIAGNOSIS — Z9189 Other specified personal risk factors, not elsewhere classified: Secondary | ICD-10-CM

## 2018-10-15 MED ORDER — CHLORTHALIDONE 25 MG PO TABS: 25.0000 mg | ORAL_TABLET | Freq: Every day | ORAL | 0 refills | Status: DC

## 2018-10-15 MED ORDER — DILTIAZEM HCL ER COATED BEADS 240 MG PO TB24: 240.0000 mg | ORAL_TABLET | Freq: Every day | ORAL | 0 refills | Status: DC

## 2018-10-15 MED ORDER — BUPROPION HCL ER (SR) 150 MG PO TB12: 150.0000 mg | ORAL_TABLET | Freq: Every day | ORAL | 0 refills | Status: DC

## 2018-10-15 MED FILL — CHLORTHALIDONE 25 MG TABS: 25 | 30 days supply | Qty: 30 | Fill #0

## 2018-10-19 NOTE — Progress Notes (Signed)
Office: [phone]  /  Fax: [phone]   HPI:   Chief Complaint: OBESITY Daniel [name] is here to discuss his progress with his obesity treatment plan. He is on the Category 3 plan and is following his eating plan approximately 85 % of the time. He states he is doing light weights for 60 minutes 6 times per week. Dallin did well with weight loss. He reports getting in all of his food on the plan. He wants to talk about holiday eating strategies.  His weight is 282 lb (127.9 kg) today and has had a weight loss of 1 pound over a period of 2 weeks since his last visit. He has lost 27 lbs since starting treatment with [name].  Hypertension Daniel [name] is a 47 y.o. male with hypertension. Daniel [name]'s blood pressure is normal and he denies chest pain. He is working weight loss to help control his blood pressure with the goal of decreasing his risk of heart attack and stroke. Daniel [name]'s blood pressure is currently controlled.  At risk for cardiovascular disease Daniel [name] is at a higher than average risk for cardiovascular disease due to obesity and hypertension. He currently denies any chest pain.  Depression with emotional eating behaviors Daniel [name] denies cravings and his blood pressure is normal. Daniel [name] struggles with emotional eating and using food for comfort to the extent that it is negatively impacting his health. He often snacks when he is not hungry. Daniel [name] sometimes feels he is out of control and then feels guilty that he made poor food choices. He has been working on behavior modification techniques to help reduce his emotional eating and has been somewhat successful. He shows no sign of suicidal or homicidal ideations.  Depression screen Daniel [name] 2/9 09/02/2018 12/24/2016  Decreased Interest 0 0  Down, Depressed, Hopeless 0 0  PHQ - 2 Score 0 0  Altered sleeping - 0  Tired, decreased energy - 1  Change in appetite - 0  Feeling bad or failure about yourself  - 0  Trouble concentrating - 1  Moving  slowly or fidgety/restless - 0  Suicidal thoughts - 0  PHQ-9 Score - 2    ALLERGIES: Allergies  Allergen Reactions  . Bee Venom Anaphylaxis  . Shellfish Allergy Anaphylaxis    MEDICATIONS: Current Outpatient Medications on File Prior to Visit  Medication Sig Dispense Refill  . albuterol (VENTOLIN HFA) 108 (90 Base) MCG/ACT inhaler INHALE 2 PUFFS INTO THE LUNGS EVERY 4 HOURS AS NEEDED FOR WHEEZING OR SHORTNESS OF BREATH. 18 g 5  . EPINEPHrine (EPIPEN 2-PAK) 0.3 mg/0.3 mL SOAJ injection Inject 0.3 mLs (0.3 mg total) into the muscle once. 1 Device 11  . etodolac (LODINE) 400 MG tablet Take 1 tablet (400 mg total) by mouth 2 (two) times daily as needed. 60 tablet 3  . levocetirizine (XYZAL) 5 MG tablet Take 1 tablet (5 mg total) by mouth every evening. 90 tablet 3  . montelukast (SINGULAIR) 10 MG tablet Take 1 tablet (10 mg total) by mouth at bedtime. 90 tablet 3  . Multiple Vitamin (MULTIVITAMIN WITH MINERALS) TABS Take 1 tablet by mouth daily.    [name] omeprazole (PRILOSEC) 20 MG capsule TAKE 1 CAPSULE BY MOUTH ONCE DAILY 90 capsule 3  . polyethylene glycol powder (GLYCOLAX/MIRALAX) powder Take 17 g by mouth daily as needed. 3350 g 1  . Polyvinyl Alcohol-Povidone (CLEAR EYES ALL SEASONS) 5-6 MG/ML SOLN Apply 1 drop to eye once as needed. Reported on 02/01/2016    . Vitamin D, Ergocalciferol, (DRISDOL)  50000 units CAPS capsule Take 1 capsule (50,000 Units total) by mouth every 7 (seven) days. 4 capsule 0   No current facility-administered medications on file prior to visit.     PAST MEDICAL HISTORY: Past Medical History:  Diagnosis Date  . Asthma   . GERD (gastroesophageal reflux disease)   . Hypertension     PAST SURGICAL HISTORY: No past surgical history on file.  SOCIAL HISTORY: Social History   Tobacco Use  . Smoking status: Never Smoker  . Smokeless tobacco: Never Used  Substance Use Topics  . Alcohol use: No    Alcohol/week: 0.0 standard drinks  . Drug use: No     FAMILY HISTORY: Family History  Problem Relation Age of Onset  . Hyperlipidemia Unknown   . Hypertension Unknown   . Stroke Unknown   . Hypertension Mother   . Hyperlipidemia Mother   . Obesity Mother   . Sudden death Father     ROS: Review of Systems  Constitutional: Positive for weight loss.  Cardiovascular: Negative for chest pain.  Psychiatric/Behavioral: Positive for depression. Negative for suicidal ideas.    PHYSICAL EXAM: Blood pressure 126/81, pulse 66, temperature 97.7 F (36.5 C), temperature source Oral, height 5\' 9"  (1.753 m), weight 282 lb (127.9 kg), SpO2 94 %. Body mass index is 41.64 kg/m. Physical Exam  Constitutional: He is oriented to person, place, and time. He appears well-developed and well-nourished.  Cardiovascular: Normal rate.  Pulmonary/Chest: Effort normal.  Musculoskeletal: Normal range of motion.  Neurological: He is oriented to person, place, and time.  Skin: Skin is warm and dry.  Psychiatric: He has a normal mood and affect. His behavior is normal.  Vitals reviewed.   RECENT LABS AND TESTS: BMET    Component Value Date/Time   NA 141 08/05/2018 1301   K 3.9 08/05/2018 1301   CL 102 08/05/2018 1301   CO2 24 08/05/2018 1301   GLUCOSE 81 08/05/2018 1301   GLUCOSE 103 (H) 11/07/2015 1454   BUN 16 08/05/2018 1301   CREATININE 1.20 08/05/2018 1301   CALCIUM 9.3 08/05/2018 1301   GFRNONAA 73 08/05/2018 1301   GFRAA 84 08/05/2018 1301   Lab Results  Component Value Date   HGBA1C 5.4 08/05/2018   HGBA1C 5.4 11/11/2017   HGBA1C 5.3 08/06/2017   HGBA1C 5.6 04/08/2017   HGBA1C 5.5 12/24/2016   Lab Results  Component Value Date   INSULIN 21.7 08/05/2018   INSULIN 23.2 11/11/2017   INSULIN 27.6 (H) 08/06/2017   INSULIN 28.9 (H) 04/08/2017   INSULIN 25.8 (H) 12/24/2016   CBC    Component Value Date/Time   WBC 6.6 12/24/2016 1021   WBC 10.2 11/07/2015 1454   RBC 5.47 12/24/2016 1021   RBC 5.67 11/07/2015 1454   HGB 15.2  12/24/2016 1021   HCT 44.3 12/24/2016 1021   PLT 300.0 11/07/2015 1454   MCV 81 12/24/2016 1021   MCH 27.8 12/24/2016 1021   MCH 28.0 07/18/2012 2030   MCHC 34.3 12/24/2016 1021   MCHC 33.3 11/07/2015 1454   RDW 14.6 12/24/2016 1021   LYMPHSABS 2.7 12/24/2016 1021   MONOABS 0.9 11/07/2015 1454   EOSABS 0.2 12/24/2016 1021   BASOSABS 0.1 12/24/2016 1021   Iron/TIBC/Ferritin/ %Sat No results found for: IRON, TIBC, FERRITIN, IRONPCTSAT Lipid Panel     Component Value Date/Time   CHOL 182 08/05/2018 1301   TRIG 74 08/05/2018 1301   HDL 42 08/05/2018 1301   CHOLHDL 5 03/17/2015 0841   VLDL  22.8 03/17/2015 0841   LDLCALC 125 (H) 08/05/2018 1301   Hepatic Function Panel     Component Value Date/Time   PROT 7.6 08/05/2018 1301   ALBUMIN 4.4 08/05/2018 1301   AST 26 08/05/2018 1301   ALT 40 08/05/2018 1301   ALKPHOS 66 08/05/2018 1301   BILITOT 0.3 08/05/2018 1301   BILIDIR 0.1 11/07/2015 1454      Component Value Date/Time   TSH 2.27 09/02/2018 0913   TSH 1.370 12/24/2016 1021   TSH 1.39 03/17/2015 0841    ASSESSMENT AND PLAN: Essential hypertension - Plan: chlorthalidone (HYGROTON) 25 MG tablet, diltiazem (CARDIZEM LA) 240 MG 24 hr tablet  Other depression - with emotional eating  - Plan: buPROPion (WELLBUTRIN SR) 150 MG 12 hr tablet  At risk for heart disease  Class 3 severe obesity with serious comorbidity and body mass index (BMI) of 40.0 to 44.9 in adult, unspecified obesity type (HCC)  PLAN:  Hypertension We discussed sodium restriction, working on healthy weight loss, and a regular exercise program as the means to achieve improved blood pressure control. Daniel [name] agreed with this plan and agreed to follow up as directed. We will continue to monitor his blood pressure as well as his progress with the above lifestyle modifications. Daniel [name] agrees to continue taking chlorthalidone 25 mg qd #30 and we will refill for 1 month, and he agrees to continue taking diltiazem  240 mg qd #30 and we will refill for 1 month. He will watch for signs of hypotension as he continues his lifestyle modifications. Daniel [name] agrees to follow up with our clinic in 2 weeks.  Cardiovascular risk counselling Daniel [name] was given extended (15 minutes) coronary artery disease prevention counseling today. He is 47 y.o. male and has risk factors for heart disease including obesity and hypertension. We discussed intensive lifestyle modifications today with an emphasis on specific weight loss instructions and strategies. Pt was also informed of the importance of increasing exercise and decreasing saturated fats to help prevent heart disease.  Depression with Emotional Eating Behaviors We discussed behavior modification techniques today to help Daniel [name] deal with his emotional eating and depression. Daniel [name] agrees to continue taking Wellbutrin SR 150 mg qd #30 and we will refill for 1 month. Daniel [name] agrees to follow up with our clinic in 2 weeks.  Obesity Daniel [name] is currently in the action stage of change. As such, his goal is to continue with weight loss efforts He has agreed to follow the Category 3 plan Daniel [name] has been instructed to work up to a goal of 150 minutes of combined cardio and strengthening exercise per week for weight loss and overall health benefits. We discussed the following Behavioral Modification Strategies today: work on meal planning and easy cooking plans and holiday eating strategies    Daniel [name] has agreed to follow up with our clinic in 2 weeks. He was informed of the importance of frequent follow up visits to maximize his success with intensive lifestyle modifications for his multiple health conditions.   OBESITY BEHAVIORAL INTERVENTION VISIT  Today's visit was # 36   Starting weight: 309 lbs Starting date: 12/24/16 Today's weight : 282 lbs Today's date: 10/15/2018 Total lbs lost to date: 69    ASK: We discussed the diagnosis of obesity with Daniel [name] today and  Daniel [name] agreed to give [name] permission to discuss obesity behavioral modification therapy today.  ASSESS: Daniel [name] has the diagnosis of obesity and his BMI today is 41.63 Daniel [name] is in the action stage of change  ADVISE: Daniel [name] was educated on the multiple health risks of obesity as well as the benefit of weight loss to improve his health. He was advised of the need for long term treatment and the importance of lifestyle modifications.  AGREE: Multiple dietary modification options and treatment options were discussed and  [place] agreed to the above obesity treatment plan.  Trude McburneyI, Sharon Martin, am acting as transcriptionist for Alois Clicheracey Brice Potteiger, PA-C I, Alois Clicheracey Jagar Lua, PA-C have reviewed above note and agree with its content

## 2018-10-21 MED FILL — BUPROPION SR 150 MG TABLET: 150 | 30 days supply | Qty: 30 | Fill #0

## 2018-10-21 MED FILL — DILTIAZEM ER 240 MG TABLET: 240 | 30 days supply | Qty: 30 | Fill #0

## 2018-11-03 ENCOUNTER — Ambulatory Visit (INDEPENDENT_AMBULATORY_CARE_PROVIDER_SITE_OTHER): Payer: 59 | Admitting: Physician Assistant

## 2018-11-03 ENCOUNTER — Encounter (INDEPENDENT_AMBULATORY_CARE_PROVIDER_SITE_OTHER): Payer: Self-pay

## 2018-11-09 MED FILL — MONTELUKAST SOD 10 MG TAB: 10 | 90 days supply | Qty: 90 | Fill #2

## 2018-11-09 MED FILL — LEVOCETIRIZINE 5 MG TABLET: 5 | 90 days supply | Qty: 90 | Fill #2

## 2018-11-20 DIAGNOSIS — S83242D Other tear of medial meniscus, current injury, left knee, subsequent encounter: Secondary | ICD-10-CM | POA: Diagnosis not present

## 2018-12-10 ENCOUNTER — Encounter (INDEPENDENT_AMBULATORY_CARE_PROVIDER_SITE_OTHER): Payer: Self-pay | Admitting: Physician Assistant

## 2018-12-12 ENCOUNTER — Encounter (INDEPENDENT_AMBULATORY_CARE_PROVIDER_SITE_OTHER): Payer: Self-pay | Admitting: Physician Assistant

## 2018-12-16 ENCOUNTER — Ambulatory Visit (INDEPENDENT_AMBULATORY_CARE_PROVIDER_SITE_OTHER): Payer: 59 | Admitting: Physician Assistant

## 2018-12-16 ENCOUNTER — Encounter (INDEPENDENT_AMBULATORY_CARE_PROVIDER_SITE_OTHER): Payer: Self-pay | Admitting: Physician Assistant

## 2018-12-16 VITALS — BP 133/81 | HR 65 | Temp 97.9°F | Ht 69.0 in | Wt 292.0 lb

## 2018-12-16 DIAGNOSIS — Z6841 Body Mass Index (BMI) 40.0 and over, adult: Secondary | ICD-10-CM

## 2018-12-16 DIAGNOSIS — Z9189 Other specified personal risk factors, not elsewhere classified: Secondary | ICD-10-CM

## 2018-12-16 DIAGNOSIS — F3289 Other specified depressive episodes: Secondary | ICD-10-CM

## 2018-12-16 DIAGNOSIS — I10 Essential (primary) hypertension: Secondary | ICD-10-CM | POA: Diagnosis not present

## 2018-12-16 MED ORDER — BUPROPION HCL ER (SR) 150 MG PO TB12: 150.0000 mg | ORAL_TABLET | Freq: Every day | ORAL | 0 refills | Status: DC

## 2018-12-16 MED ORDER — CHLORTHALIDONE 25 MG PO TABS: 25.0000 mg | ORAL_TABLET | Freq: Every day | ORAL | 0 refills | Status: DC

## 2018-12-16 MED ORDER — DILTIAZEM HCL ER COATED BEADS 240 MG PO TB24: 240.0000 mg | ORAL_TABLET | Freq: Every day | ORAL | 0 refills | Status: DC

## 2018-12-16 MED FILL — DILTIAZEM ER 240 MG TABLET: 240 | 30 days supply | Qty: 30 | Fill #0

## 2018-12-16 MED FILL — CHLORTHALIDONE 25 MG TABS: 25 | 30 days supply | Qty: 30 | Fill #0

## 2018-12-16 NOTE — Progress Notes (Signed)
Office: [phone]  /  Fax: [phone]   HPI:   Chief Complaint: OBESITY Dekhari is here to discuss his progress with his obesity treatment plan. He is on the Category 3 plan and is following his eating plan approximately 50 % of the time. He states he is cardio and strength training 60 minutes 4 times per week. Grayer reports that he got off track with his eating during the holidays. He is ready to get back on track. His weight is 292 lb (132.5 kg) today and has gained 10 lbs since his last visit. He has lost 17 lbs since starting treatment with [name].  Hypertension DORNELL JACQUE is a 48 y.o. male with hypertension.  Jamarquez Dalto Falletta denies chest pain. He is working on weight loss to help control his blood pressure with the goal of decreasing his risk of heart attack and stroke. [name] blood pressure is currently controlled.  Depression with emotional eating behaviors Teyon is struggling with emotional eating and using food for comfort to the extent that it is negatively impacting his health. He often snacks when he is not hungry. Ajon sometimes feels he is out of control and then feels guilty that he made poor food choices. He has been working on behavior modification techniques to help reduce his emotional eating and has been somewhat successful. He is currently taking bupropion. He shows no sign of suicidal or homicidal ideations.  Depression screen [facility] 2/9 09/02/2018 12/24/2016  Decreased Interest 0 0  Down, Depressed, Hopeless 0 0  PHQ - 2 Score 0 0  Altered sleeping - 0  Tired, decreased energy - 1  Change in appetite - 0  Feeling bad or failure about yourself  - 0  Trouble concentrating - 1  Moving slowly or fidgety/restless - 0  Suicidal thoughts - 0  PHQ-9 Score - 2    At risk for cardiovascular disease Moir is at a higher than average risk for cardiovascular disease due to obesity. He currently denies any chest pain.  ASSESSMENT AND PLAN:  Essential hypertension -  Plan: chlorthalidone (HYGROTON) 25 MG tablet, diltiazem (CARDIZEM LA) 240 MG 24 hr tablet  Other depression - with emotional eating  - Plan: buPROPion (WELLBUTRIN SR) 150 MG 12 hr tablet  At risk for heart disease  Class 3 severe obesity with serious comorbidity and body mass index (BMI) of 40.0 to 44.9 in adult, unspecified obesity type (HCC)  PLAN:  Hypertension We discussed sodium restriction, working on healthy weight loss, and a regular exercise program as the means to achieve improved blood pressure control. Jasson agreed with this plan and agreed to follow up as directed. We will continue to monitor his blood pressure as well as his progress with the above lifestyle modifications. Masaaki agrees to continue taking chlorthalidone 25 mg qd #30 with no refills and diltiazem 240 mg qd #30 with no refills and will watch for signs of hypotension as he continues his lifestyle modifications. Jamille agrees to follow up with our clinic in 2 weeks.  Depression with Emotional Eating Behaviors We discussed behavior modification techniques today to help Akif deal with his emotional eating and depression. Emer has agreed to continue taking Wellbutrin SR 150 mg qd #30 with no refills and to follow up with our clinic in 2 weeks.  Cardiovascular risk counseling Venard was given extended (15 minutes) coronary artery disease prevention counseling today. He is 48 y.o. Otis Dials has risk factors for heart disease including obesity. We discussed intensive lifestyle modifications  today with an emphasis on specific weight loss instructions and strategies. Pt was also informed of the importance of increasing exercise and decreasing saturated fats to help prevent heart disease.  Obesity [name] is currently in the action stage of change. As such, his goal is to continue with weight loss efforts He has agreed to follow the Category 3 plan [name] has been instructed to work up to a goal of 150 minutes of combined  cardio and strengthening exercise per week for weight loss and overall health benefits. We discussed the following Behavioral Modification Strategies today: work on meal planning and easy cooking plans and planning for success  [name] has agreed to follow up with our clinic in 2 weeks. He was informed of the importance of frequent follow up visits to maximize his success with intensive lifestyle modifications for his multiple health conditions.  ALLERGIES: Allergies  Allergen Reactions  . Bee Venom Anaphylaxis  . Shellfish Allergy Anaphylaxis    MEDICATIONS: Current Outpatient Medications on File Prior to Visit  Medication Sig Dispense Refill  . albuterol (VENTOLIN HFA) 108 (90 Base) MCG/ACT inhaler INHALE 2 PUFFS INTO THE LUNGS EVERY 4 HOURS AS NEEDED FOR WHEEZING OR SHORTNESS OF BREATH. 18 g 5  . EPINEPHrine (EPIPEN 2-PAK) 0.3 mg/0.3 mL SOAJ injection Inject 0.3 mLs (0.3 mg total) into the muscle once. 1 Device 11  . etodolac (LODINE) 400 MG tablet Take 1 tablet (400 mg total) by mouth 2 (two) times daily as needed. 60 tablet 3  . levocetirizine (XYZAL) 5 MG tablet Take 1 tablet (5 mg total) by mouth every evening. 90 tablet 3  . montelukast (SINGULAIR) 10 MG tablet Take 1 tablet (10 mg total) by mouth at bedtime. 90 tablet 3  . Multiple Vitamin (MULTIVITAMIN WITH MINERALS) TABS Take 1 tablet by mouth daily.    [name]. omeprazole (PRILOSEC) 20 MG capsule TAKE 1 CAPSULE BY MOUTH ONCE DAILY 90 capsule 3  . polyethylene glycol powder (GLYCOLAX/MIRALAX) powder Take 17 g by mouth daily as needed. 3350 g 1  . Polyvinyl Alcohol-Povidone (CLEAR EYES ALL SEASONS) 5-6 MG/ML SOLN Apply 1 drop to eye once as needed. Reported on 02/01/2016    . Vitamin D, Ergocalciferol, (DRISDOL) 50000 units CAPS capsule Take 1 capsule (50,000 Units total) by mouth every 7 (seven) days. 4 capsule 0   No current facility-administered medications on file prior to visit.     PAST MEDICAL HISTORY: Past Medical History:    Diagnosis Date  . Asthma   . GERD (gastroesophageal reflux disease)   . Hypertension     PAST SURGICAL HISTORY: History reviewed. No pertinent surgical history.  SOCIAL HISTORY: Social History   Tobacco Use  . Smoking status: Never Smoker  . Smokeless tobacco: Never Used  Substance Use Topics  . Alcohol use: No    Alcohol/week: 0.0 standard drinks  . Drug use: No    FAMILY HISTORY: Family History  Problem Relation Age of Onset  . Hyperlipidemia Unknown   . Hypertension Unknown   . Stroke Unknown   . Hypertension Mother   . Hyperlipidemia Mother   . Obesity Mother   . Sudden death Father     ROS: Review of Systems  Constitutional: Negative for weight loss.  Cardiovascular: Negative for chest pain.  Psychiatric/Behavioral: Positive for depression. Negative for suicidal ideas.       Negative for homicidal ideation    PHYSICAL EXAM: Blood pressure 133/81, pulse 65, temperature 97.9 F (36.6 C), temperature source Oral, height 5\' 9"  (  1.753 m), weight 292 lb (132.5 kg), SpO2 97 %. Body mass index is 43.12 kg/m. Physical Exam Vitals signs reviewed.  Constitutional:      Appearance: Normal appearance. He is obese.  Cardiovascular:     Rate and Rhythm: Normal rate.  Pulmonary:     Effort: Pulmonary effort is normal.  Musculoskeletal: Normal range of motion.  Skin:    General: Skin is warm and dry.  Neurological:     Mental Status: He is alert and oriented to person, place, and time.  Psychiatric:        Mood and Affect: Mood normal.     RECENT LABS AND TESTS: BMET    Component Value Date/Time   NA 141 08/05/2018 1301   K 3.9 08/05/2018 1301   CL 102 08/05/2018 1301   CO2 24 08/05/2018 1301   GLUCOSE 81 08/05/2018 1301   GLUCOSE 103 (H) 11/07/2015 1454   BUN 16 08/05/2018 1301   CREATININE 1.20 08/05/2018 1301   CALCIUM 9.3 08/05/2018 1301   GFRNONAA 73 08/05/2018 1301   GFRAA 84 08/05/2018 1301   Lab Results  Component Value Date   HGBA1C 5.4  08/05/2018   HGBA1C 5.4 11/11/2017   HGBA1C 5.3 08/06/2017   HGBA1C 5.6 04/08/2017   HGBA1C 5.5 12/24/2016   Lab Results  Component Value Date   INSULIN 21.7 08/05/2018   INSULIN 23.2 11/11/2017   INSULIN 27.6 (H) 08/06/2017   INSULIN 28.9 (H) 04/08/2017   INSULIN 25.8 (H) 12/24/2016   CBC    Component Value Date/Time   WBC 6.6 12/24/2016 1021   WBC 10.2 11/07/2015 1454   RBC 5.47 12/24/2016 1021   RBC 5.67 11/07/2015 1454   HGB 15.2 12/24/2016 1021   HCT 44.3 12/24/2016 1021   PLT 300.0 11/07/2015 1454   MCV 81 12/24/2016 1021   MCH 27.8 12/24/2016 1021   MCH 28.0 07/18/2012 2030   MCHC 34.3 12/24/2016 1021   MCHC 33.3 11/07/2015 1454   RDW 14.6 12/24/2016 1021   LYMPHSABS 2.7 12/24/2016 1021   MONOABS 0.9 11/07/2015 1454   EOSABS 0.2 12/24/2016 1021   BASOSABS 0.1 12/24/2016 1021   Iron/TIBC/Ferritin/ %Sat No results found for: IRON, TIBC, FERRITIN, IRONPCTSAT Lipid Panel     Component Value Date/Time   CHOL 182 08/05/2018 1301   TRIG 74 08/05/2018 1301   HDL 42 08/05/2018 1301   CHOLHDL 5 03/17/2015 0841   VLDL 22.8 03/17/2015 0841   LDLCALC 125 (H) 08/05/2018 1301   Hepatic Function Panel     Component Value Date/Time   PROT 7.6 08/05/2018 1301   ALBUMIN 4.4 08/05/2018 1301   AST 26 08/05/2018 1301   ALT 40 08/05/2018 1301   ALKPHOS 66 08/05/2018 1301   BILITOT 0.3 08/05/2018 1301   BILIDIR 0.1 11/07/2015 1454      Component Value Date/Time   TSH 2.27 09/02/2018 0913   TSH 1.370 12/24/2016 1021   TSH 1.39 03/17/2015 0841      OBESITY BEHAVIORAL INTERVENTION VISIT  Today's visit was # 37   Starting weight:309 lbs Starting date: 12/24/2016 Today's weight :: 292 lbs Today's date: 12/16/2018 Total lbs lost to date: 17   ASK: We discussed the diagnosis of obesity with Marchia Bondracey M Gartin today and [name] agreed to give us permission to discuss obesity behavioral modification therapy today.  ASSESS: [name] has the diagnosis of obesity and his  BMI today is 43.1 Woodley is in the action stage of change   ADVISE: [name] was educated  on the multiple health risks of obesity as well as the benefit of weight loss to improve his health. He was advised of the need for long term treatment and the importance of lifestyle modifications to improve his current health and to decrease his risk of future health problems.  AGREE: Multiple dietary modification options and treatment options were discussed and  Derwin agreed to follow the recommendations documented in the above note.  ARRANGE: Keandre was educated on the importance of frequent visits to treat obesity as outlined per CMS and USPSTF guidelines and agreed to schedule his next follow up appointment today.  I, Tammy Wysor, am acting as Energy manager for [employer] I, Alois Cliche, PA-C have reviewed above note and agree with its content

## 2018-12-21 MED FILL — BUPROPION SR 150 MG TABLET: 150 | 30 days supply | Qty: 30 | Fill #0

## 2018-12-30 ENCOUNTER — Encounter (INDEPENDENT_AMBULATORY_CARE_PROVIDER_SITE_OTHER): Payer: Self-pay | Admitting: Physician Assistant

## 2018-12-30 ENCOUNTER — Ambulatory Visit (INDEPENDENT_AMBULATORY_CARE_PROVIDER_SITE_OTHER): Payer: 59 | Admitting: Physician Assistant

## 2018-12-30 VITALS — BP 126/81 | HR 68 | Temp 97.9°F | Ht 69.0 in | Wt 282.0 lb

## 2018-12-30 DIAGNOSIS — Z6841 Body Mass Index (BMI) 40.0 and over, adult: Secondary | ICD-10-CM

## 2018-12-30 DIAGNOSIS — E559 Vitamin D deficiency, unspecified: Secondary | ICD-10-CM

## 2018-12-30 NOTE — Progress Notes (Signed)
Office: [phone]  /  Fax: [phone]   HPI:   Chief Complaint: OBESITY [name] is here to discuss his progress with his obesity treatment plan. He is on the  follow the Category 3 plan and is following his eating plan approximately 85-90% of the time. He states he is doing cardio and weights 90 minutes 5 times per week. [name] did very well with weight loss. He reports that he has increased his protein and water. He is not eating enough vegetables and has had some muscle cramping. His weight is 282 lb (127.9 kg) today and has had a weight loss of 10 pounds over a period of 2 weeks since his last visit. He has lost 27 lbs since starting treatment with [name].  Vitamin D deficiency [name] has a diagnosis of Vitamin D deficiency. He is currently taking Vit D and denies nausea, vomiting or muscle weakness.  ASSESSMENT AND PLAN:  Vitamin D deficiency  Class 3 severe obesity with serious comorbidity and body mass index (BMI) of 40.0 to 44.9 in adult, unspecified obesity type (HCC)  PLAN:  Vitamin D Deficiency [name] was informed that low Vitamin D levels contributes to fatigue and are associated with obesity, breast, and colon cancer. [name] will take over-the-counter Vitamin D and will follow up for routine testing of Vitamin D, at least 2-3 times per year. He was informed of the risk of over-replacement of Vitamin D and agrees to not increase his dose unless he discusses this with [name] first. [name] agrees to follow-up with our clinic in 3 weeks.  I spent > than 50% of the 15 minute visit on counseling as documented in the note.  Obesity [name] is currently in the action stage of change. As such, his goal is to continue with weight loss efforts. He has agreed to follow the Category 3 plan. [name] has been instructed to work up to a goal of 150 minutes of combined cardio and strengthening exercise per week for weight loss and overall health benefits. We discussed the following Behavioral  Modification Stratagies today: work on meal planning and easy cooking plans and keeping healthy foods in the home.  [name] has agreed to follow up with our clinic in 3 weeks. He was informed of the importance of frequent follow up visits to maximize his success with intensive lifestyle modifications for his multiple health conditions.  ALLERGIES: Allergies  Allergen Reactions  . Bee Venom Anaphylaxis  . Shellfish Allergy Anaphylaxis    MEDICATIONS: Current Outpatient Medications on File Prior to Visit  Medication Sig Dispense Refill  . albuterol (VENTOLIN HFA) 108 (90 Base) MCG/ACT inhaler INHALE 2 PUFFS INTO THE LUNGS EVERY 4 HOURS AS NEEDED FOR WHEEZING OR SHORTNESS OF BREATH. 18 g 5  . buPROPion (WELLBUTRIN SR) 150 MG 12 hr tablet Take 1 tablet (150 mg total) by mouth daily. 30 tablet 0  . chlorthalidone (HYGROTON) 25 MG tablet Take 1 tablet (25 mg total) by mouth daily. 30 tablet 0  . diltiazem (CARDIZEM LA) 240 MG 24 hr tablet Take 1 tablet (240 mg total) by mouth daily. 30 tablet 0  . EPINEPHrine (EPIPEN 2-PAK) 0.3 mg/0.3 mL SOAJ injection Inject 0.3 mLs (0.3 mg total) into the muscle once. 1 Device 11  . etodolac (LODINE) 400 MG tablet Take 1 tablet (400 mg total) by mouth 2 (two) times daily as needed. 60 tablet 3  . levocetirizine (XYZAL) 5 MG tablet Take 1 tablet (5 mg total) by mouth every evening. 90 tablet 3  .  montelukast (SINGULAIR) 10 MG tablet Take 1 tablet (10 mg total) by mouth at bedtime. 90 tablet 3  . Multiple Vitamin (MULTIVITAMIN WITH MINERALS) TABS Take 1 tablet by mouth daily.    [name] omeprazole (PRILOSEC) 20 MG capsule TAKE 1 CAPSULE BY MOUTH ONCE DAILY 90 capsule 3  . polyethylene glycol powder (GLYCOLAX/MIRALAX) powder Take 17 g by mouth daily as needed. 3350 g 1  . Polyvinyl Alcohol-Povidone (CLEAR EYES ALL SEASONS) 5-6 MG/ML SOLN Apply 1 drop to eye once as needed. Reported on 02/01/2016     No current facility-administered medications on file prior to visit.      PAST MEDICAL HISTORY: Past Medical History:  Diagnosis Date  . Asthma   . GERD (gastroesophageal reflux disease)   . Hypertension     PAST SURGICAL HISTORY: History reviewed. No pertinent surgical history.  SOCIAL HISTORY: Social History   Tobacco Use  . Smoking status: Never Smoker  . Smokeless tobacco: Never Used  Substance Use Topics  . Alcohol use: No    Alcohol/week: 0.0 standard drinks  . Drug use: No    FAMILY HISTORY: Family History  Problem Relation Age of Onset  . Hyperlipidemia Unknown   . Hypertension Unknown   . Stroke Unknown   . Hypertension Mother   . Hyperlipidemia Mother   . Obesity Mother   . Sudden death Father    ROS: Review of Systems  Constitutional: Positive for weight loss.  Gastrointestinal: Negative for nausea and vomiting.  Musculoskeletal:       Negative muscle weakness  Endo/Heme/Allergies:       Negative hypoglycemia   PHYSICAL EXAM: Blood pressure 126/81, pulse 68, temperature 97.9 F (36.6 C), temperature source Oral, height 5\' 9"  (1.753 m), weight 282 lb (127.9 kg), SpO2 97 %. Body mass index is 41.64 kg/m. Physical Exam Vitals signs reviewed.  Constitutional:      Appearance: Normal appearance. He is obese.  Cardiovascular:     Rate and Rhythm: Normal rate.     Pulses: Normal pulses.  Pulmonary:     Effort: Pulmonary effort is normal.     Breath sounds: Normal breath sounds.  Musculoskeletal: Normal range of motion.  Skin:    General: Skin is warm and dry.  Neurological:     Mental Status: He is alert and oriented to person, place, and time.  Psychiatric:        Mood and Affect: Mood normal.        Behavior: Behavior normal.   RECENT LABS AND TESTS: BMET    Component Value Date/Time   NA 141 08/05/2018 1301   K 3.9 08/05/2018 1301   CL 102 08/05/2018 1301   CO2 24 08/05/2018 1301   GLUCOSE 81 08/05/2018 1301   GLUCOSE 103 (H) 11/07/2015 1454   BUN 16 08/05/2018 1301   CREATININE 1.20 08/05/2018 1301    CALCIUM 9.3 08/05/2018 1301   GFRNONAA 73 08/05/2018 1301   GFRAA 84 08/05/2018 1301   Lab Results  Component Value Date   HGBA1C 5.4 08/05/2018   HGBA1C 5.4 11/11/2017   HGBA1C 5.3 08/06/2017   HGBA1C 5.6 04/08/2017   HGBA1C 5.5 12/24/2016   Lab Results  Component Value Date   INSULIN 21.7 08/05/2018   INSULIN 23.2 11/11/2017   INSULIN 27.6 (H) 08/06/2017   INSULIN 28.9 (H) 04/08/2017   INSULIN 25.8 (H) 12/24/2016   CBC    Component Value Date/Time   WBC 6.6 12/24/2016 1021   WBC 10.2 11/07/2015 1454  RBC 5.47 12/24/2016 1021   RBC 5.67 11/07/2015 1454   HGB 15.2 12/24/2016 1021   HCT 44.3 12/24/2016 1021   PLT 300.0 11/07/2015 1454   MCV 81 12/24/2016 1021   MCH 27.8 12/24/2016 1021   MCH 28.0 07/18/2012 2030   MCHC 34.3 12/24/2016 1021   MCHC 33.3 11/07/2015 1454   RDW 14.6 12/24/2016 1021   LYMPHSABS 2.7 12/24/2016 1021   MONOABS 0.9 11/07/2015 1454   EOSABS 0.2 12/24/2016 1021   BASOSABS 0.1 12/24/2016 1021   Iron/TIBC/Ferritin/ %Sat No results found for: IRON, TIBC, FERRITIN, IRONPCTSAT Lipid Panel     Component Value Date/Time   CHOL 182 08/05/2018 1301   TRIG 74 08/05/2018 1301   HDL 42 08/05/2018 1301   CHOLHDL 5 03/17/2015 0841   VLDL 22.8 03/17/2015 0841   LDLCALC 125 (H) 08/05/2018 1301   Hepatic Function Panel     Component Value Date/Time   PROT 7.6 08/05/2018 1301   ALBUMIN 4.4 08/05/2018 1301   AST 26 08/05/2018 1301   ALT 40 08/05/2018 1301   ALKPHOS 66 08/05/2018 1301   BILITOT 0.3 08/05/2018 1301   BILIDIR 0.1 11/07/2015 1454      Component Value Date/Time   TSH 2.27 09/02/2018 0913   TSH 1.370 12/24/2016 1021   TSH 1.39 03/17/2015 0841   Results for Marchia BondROOKS, Marciano M (MRN [id number]) as of 12/30/2018 13:16  Ref. Range 08/05/2018 13:01  Vitamin D, 25-Hydroxy Latest Ref Range: 30.0 - 100.0 ng/mL 54.7   OBESITY BEHAVIORAL INTERVENTION VISIT  Today's visit was #38   Starting weight: 309 lbs Starting date: 12/24/2016 Today's  weight: 282 lbs  Today's date: 12/30/2018 Total lbs lost to date: 6327  ASK: We discussed the diagnosis of obesity with Marchia Bondracey M Crissman today and [name] agreed to give us permission to discuss obesity behavioral modification therapy today.  ASSESS: [name] has the diagnosis of obesity and his BMI today is 41.64. [name] is in the action stage of change.  ADVISE: [name] was educated on the multiple health risks of obesity as well as the benefit of weight loss to improve his health. He was advised of the need for long term treatment and the importance of lifestyle modifications to improve his current health and to decrease his risk of future health problems.  AGREE: Multiple dietary modification options and treatment options were discussed and  Jedi agreed to follow the recommendations documented in the above note.  ARRANGE: [name] was educated on the importance of frequent visits to treat obesity as outlined per CMS and USPSTF guidelines and agreed to schedule his next follow up appointment today.  Fernanda DrumI, Denise Haag, am acting as transcriptionist for Alois Clicheracey Tangi Shroff, PA-C I, Alois Clicheracey Verna Hamon, PA-C have reviewed above note and agree with its content

## 2019-01-19 ENCOUNTER — Ambulatory Visit (INDEPENDENT_AMBULATORY_CARE_PROVIDER_SITE_OTHER): Payer: Commercial Managed Care - PPO | Admitting: Physician Assistant

## 2019-01-19 ENCOUNTER — Encounter (INDEPENDENT_AMBULATORY_CARE_PROVIDER_SITE_OTHER): Payer: Self-pay | Admitting: Physician Assistant

## 2019-01-19 VITALS — BP 119/78 | HR 74 | Temp 98.7°F | Ht 69.0 in | Wt 283.0 lb

## 2019-01-19 DIAGNOSIS — E7849 Other hyperlipidemia: Secondary | ICD-10-CM | POA: Diagnosis not present

## 2019-01-19 DIAGNOSIS — F3289 Other specified depressive episodes: Secondary | ICD-10-CM

## 2019-01-19 DIAGNOSIS — Z9189 Other specified personal risk factors, not elsewhere classified: Secondary | ICD-10-CM | POA: Diagnosis not present

## 2019-01-19 DIAGNOSIS — E8881 Metabolic syndrome: Secondary | ICD-10-CM

## 2019-01-19 DIAGNOSIS — I10 Essential (primary) hypertension: Secondary | ICD-10-CM | POA: Diagnosis not present

## 2019-01-19 DIAGNOSIS — E559 Vitamin D deficiency, unspecified: Secondary | ICD-10-CM | POA: Diagnosis not present

## 2019-01-19 DIAGNOSIS — Z6841 Body Mass Index (BMI) 40.0 and over, adult: Secondary | ICD-10-CM | POA: Diagnosis not present

## 2019-01-19 MED ORDER — BUPROPION HCL ER (SR) 150 MG PO TB12: 150.0000 mg | ORAL_TABLET | Freq: Every day | ORAL | 0 refills | Status: DC

## 2019-01-19 MED ORDER — DILTIAZEM HCL ER COATED BEADS 240 MG PO TB24: 240.0000 mg | ORAL_TABLET | Freq: Every day | ORAL | 0 refills | Status: DC

## 2019-01-19 MED ORDER — CHLORTHALIDONE 25 MG PO TABS: 25.0000 mg | ORAL_TABLET | Freq: Every day | ORAL | 0 refills | Status: DC

## 2019-01-19 MED FILL — DILTIAZEM ER 240 MG TABLET: 240 | 30 days supply | Qty: 30 | Fill #0

## 2019-01-19 MED FILL — CHLORTHALIDONE 25 MG TABS: 25 | 30 days supply | Qty: 30 | Fill #0

## 2019-01-19 NOTE — Progress Notes (Signed)
Office: [phone]  /  Fax: [phone]   HPI:   Chief Complaint: OBESITY Daniel [name] is here to discuss his progress with his obesity treatment plan. He is on the Category 3 plan and is following his eating plan approximately 75% of the time. He states he is doing cardio and strengthening exercises 60 minutes 5 times per week. Daniel [name] reports that he thinks he has been undereating his protein. He is using Friday as a cheat day to eat what he wants. He also states that weekends are harder to stay on track. His weight is 283 lb (128.4 kg) today and has had a weight gain of 1 lb since his last visit. He has lost 26 lbs since starting treatment with [name].  Hypertension Daniel [name] is a 48 y.o. male with hypertension.  Daniel [name] denies chest pain or shortness of breath on exertion. He is working weight loss to help control his blood pressure with the goal of decreasing his risk of heart attack and stroke. Daniel [name] blood pressure is currently controlled. He is currently on diltiazem and chlorthalidone and denies chest pain.  At risk for cardiovascular disease Daniel [name] is at a higher than average risk for cardiovascular disease due to obesity. He currently denies any chest pain.  Depression with emotional eating behaviors Daniel [name] is struggling with emotional eating and using food for comfort to the extent that it is negatively impacting his health. He often snacks when he is not hungry. Daniel [name] sometimes feels he is out of control and then feels guilty that he made poor food choices. He reports no cravings and shows no sign of suicidal or homicidal ideations.  Depression screen [facility] 2/9 09/02/2018 12/24/2016  Decreased Interest 0 0  Down, Depressed, Hopeless 0 0  PHQ - 2 Score 0 0  Altered sleeping - 0  Tired, decreased energy - 1  Change in appetite - 0  Feeling bad or failure about yourself  - 0  Trouble concentrating - 1  Moving slowly or fidgety/restless - 0  Suicidal thoughts - 0  PHQ-9  Score - 2   Vitamin D deficiency Daniel [name] has a diagnosis of Vitamin D deficiency and is not currently taking Vitamin D.  Insulin Resistance Daniel [name] has a diagnosis of insulin resistance based on his elevated fasting insulin level >5 and his last level [name] not at goal. Although Daniel [name] blood glucose readings are still under good control, insulin resistance puts him at greater risk of metabolic syndrome and diabetes. He is not taking metformin currently and continues to work on diet and exercise to decrease risk of diabetes.  Hyperlipidemia Daniel [name] has hyperlipidemia and has been trying to improve his cholesterol levels with intensive lifestyle modification including a low saturated fat diet, exercise and weight loss. He is currently on no medication and denies any chest pain.  ASSESSMENT AND PLAN:  Essential hypertension - Plan: diltiazem (CARDIZEM LA) 240 MG 24 hr tablet, chlorthalidone (HYGROTON) 25 MG tablet  Vitamin D deficiency - Plan: VITAMIN D 25 Hydroxy (Vit-D Deficiency, Fractures)  Insulin resistance - Plan: Comprehensive metabolic panel, Hemoglobin A1c, Insulin, random  Other hyperlipidemia - Plan: Lipid Panel With LDL/HDL Ratio  Other depression - with emotional eating  - Plan: buPROPion (WELLBUTRIN SR) 150 MG 12 hr tablet  At risk for heart disease  Class 3 severe obesity with serious comorbidity and body mass index (BMI) of 40.0 to 44.9 in adult, unspecified obesity type (HCC)  PLAN:  Hypertension We discussed sodium restriction, working on healthy  weight loss, and a regular exercise program as the means to achieve improved blood pressure control. [name] agreed with this plan and agreed to follow up as directed. We will continue to monitor his blood pressure as well as his progress with the above lifestyle modifications. He [name] given refills on diltiazem #30 with no refills and chlorthalidone #30 with no refills. He will watch for signs of hypotension as he continues his  lifestyle modifications.  Cardiovascular risk counseling Daniel [name] given extended (15 minutes) coronary artery disease prevention counseling today. He is 48 y.o. male and has risk factors for heart disease including obesity. We discussed intensive lifestyle modifications today with an emphasis on specific weight loss instructions and strategies. Pt [name] also informed of the importance of increasing exercise and decreasing saturated fats to help prevent heart disease.  Depression with Emotional Eating Behaviors We discussed behavior modification techniques today to help Daniel [name] deal with his emotional eating and depression. He [name] given a refill on his bupropion #30 with no refills and agrees to follow-up with our clinic in 3 weeks.   Vitamin D Deficiency Daniel [name] informed that low Vitamin D levels contributes to fatigue and are associated with obesity, breast, and colon cancer. He is currently not taking Vitamin D. He will have labs drawn today. Daniel [name] agrees to follow-up with our clinic in 3 weeks.  Insulin Resistance Daniel [name] will continue to work on weight loss, exercise, and decreasing simple carbohydrates in his diet to help decrease the risk of diabetes. We dicussed metformin including benefits and risks. He [name] informed that eating too many simple carbohydrates or too many calories at one sitting increases the likelihood of GI side effects. Daniel [name] is not on metformin for now and prescription [name] not written today. Daniel [name] agreed to follow-up with [name] as directed to monitor his progress. We will check labs today.  Hyperlipidemia Daniel [name] informed of the American Heart Association Guidelines emphasizing intensive lifestyle modifications as the first line treatment for hyperlipidemia. We discussed many lifestyle modifications today in depth, and [name] will continue to work on decreasing saturated fats such as fatty red meat, butter and many fried foods. He will also increase vegetables and lean  protein in his diet and continue to work on exercise and weight loss efforts. We will check labs today.  Obesity Daniel [name] is currently in the action stage of change. As such, his goal is to continue with weight loss efforts. He has agreed to follow the Category 3 plan. Daniel [name] has been instructed to work up to a goal of 150 minutes of combined cardio and strengthening exercise per week for weight loss and overall health benefits. We discussed the following Behavioral Modification Strategies today: increasing lean protein intake and work on meal planning and easy cooking plans.  Daniel [name] has agreed to follow-up with our clinic in 3 weeks. He [name] informed of the importance of frequent follow up visits to maximize his success with intensive lifestyle modifications for his multiple health conditions.  ALLERGIES: Allergies  Allergen Reactions  . Bee Venom Anaphylaxis  . Shellfish Allergy Anaphylaxis    MEDICATIONS: Current Outpatient Medications on File Prior to Visit  Medication Sig Dispense Refill  . albuterol (VENTOLIN HFA) 108 (90 Base) MCG/ACT inhaler INHALE 2 PUFFS INTO THE LUNGS EVERY 4 HOURS AS NEEDED FOR WHEEZING OR SHORTNESS OF BREATH. 18 g 5  . EPINEPHrine (EPIPEN 2-PAK) 0.3 mg/0.3 mL SOAJ injection Inject 0.3 mLs (0.3 mg total) into the muscle once. 1 Device 11  .  etodolac (LODINE) 400 MG tablet Take 1 tablet (400 mg total) by mouth 2 (two) times daily as needed. 60 tablet 3  . levocetirizine (XYZAL) 5 MG tablet Take 1 tablet (5 mg total) by mouth every evening. 90 tablet 3  . montelukast (SINGULAIR) 10 MG tablet Take 1 tablet (10 mg total) by mouth at bedtime. 90 tablet 3  . Multiple Vitamin (MULTIVITAMIN WITH MINERALS) TABS Take 1 tablet by mouth daily.    Daniel [name] Kitchen omeprazole (PRILOSEC) 20 MG capsule TAKE 1 CAPSULE BY MOUTH ONCE DAILY 90 capsule 3  . polyethylene glycol powder (GLYCOLAX/MIRALAX) powder Take 17 g by mouth daily as needed. 3350 g 1  . Polyvinyl Alcohol-Povidone (CLEAR EYES  ALL SEASONS) 5-6 MG/ML SOLN Apply 1 drop to eye once as needed. Reported on 02/01/2016     No current facility-administered medications on file prior to visit.     PAST MEDICAL HISTORY: Past Medical History:  Diagnosis Date  . Asthma   . GERD (gastroesophageal reflux disease)   . Hypertension     PAST SURGICAL HISTORY: History reviewed. No pertinent surgical history.  SOCIAL HISTORY: Social History   Tobacco Use  . Smoking status: Never Smoker  . Smokeless tobacco: Never Used  Substance Use Topics  . Alcohol use: No    Alcohol/week: 0.0 standard drinks  . Drug use: No    FAMILY HISTORY: Family History  Problem Relation Age of Onset  . Hyperlipidemia Unknown   . Hypertension Unknown   . Stroke Unknown   . Hypertension Mother   . Hyperlipidemia Mother   . Obesity Mother   . Sudden death Father    ROS: Review of Systems  Constitutional: Negative for weight loss.  Cardiovascular: Negative for chest pain.  Endo/Heme/Allergies:       Negative for hypoglycemia.  Psychiatric/Behavioral: Negative for suicidal ideas.       Negative for homicidal ideas.   PHYSICAL EXAM: Blood pressure 119/78, pulse 74, temperature 98.7 F (37.1 C), temperature source Oral, height  (1.753 m), weight 283 lb (128.4 kg), SpO2 97 %. Body mass index is 41.79 kg/m. Physical Exam Vitals signs reviewed.  Constitutional:      Appearance: Normal appearance. He is obese.  Cardiovascular:     Rate and Rhythm: Normal rate.     Pulses: Normal pulses.  Pulmonary:     Effort: Pulmonary effort is normal.     Breath sounds: Normal breath sounds.  Musculoskeletal: Normal range of motion.  Skin:    General: Skin is warm and dry.  Neurological:     Mental Status: He is alert and oriented to person, place, and time.  Psychiatric:        Behavior: Behavior normal.        Thought Content: Thought content does not include homicidal or suicidal ideation.   RECENT LABS AND TESTS: BMET      Component Value Date/Time   NA 141 08/05/2018 1301   K 3.9 08/05/2018 1301   CL 102 08/05/2018 1301   CO2 24 08/05/2018 1301   GLUCOSE 81 08/05/2018 1301   GLUCOSE 103 (H) 11/07/2015 1454   BUN 16 08/05/2018 1301   CREATININE 1.20 08/05/2018 1301   CALCIUM 9.3 08/05/2018 1301   GFRNONAA 73 08/05/2018 1301   GFRAA 84 08/05/2018 1301   Lab Results  Component Value Date   HGBA1C 5.4 08/05/2018   HGBA1C 5.4 11/11/2017   HGBA1C 5.3 08/06/2017   HGBA1C 5.6 04/08/2017   HGBA1C 5.5 12/24/2016   Lab  Results  Component Value Date   INSULIN 21.7 08/05/2018   INSULIN 23.2 11/11/2017   INSULIN 27.6 (H) 08/06/2017   INSULIN 28.9 (H) 04/08/2017   INSULIN 25.8 (H) 12/24/2016   CBC    Component Value Date/Time   WBC 6.6 12/24/2016 1021   WBC 10.2 11/07/2015 1454   RBC 5.47 12/24/2016 1021   RBC 5.67 11/07/2015 1454   HGB 15.2 12/24/2016 1021   HCT 44.3 12/24/2016 1021   PLT 300.0 11/07/2015 1454   MCV 81 12/24/2016 1021   MCH 27.8 12/24/2016 1021   MCH 28.0 07/18/2012 2030   MCHC 34.3 12/24/2016 1021   MCHC 33.3 11/07/2015 1454   RDW 14.6 12/24/2016 1021   LYMPHSABS 2.7 12/24/2016 1021   MONOABS 0.9 11/07/2015 1454   EOSABS 0.2 12/24/2016 1021   BASOSABS 0.1 12/24/2016 1021   Iron/TIBC/Ferritin/ %Sat No results found for: IRON, TIBC, FERRITIN, IRONPCTSAT Lipid Panel     Component Value Date/Time   CHOL 182 08/05/2018 1301   TRIG 74 08/05/2018 1301   HDL 42 08/05/2018 1301   CHOLHDL 5 03/17/2015 0841   VLDL 22.8 03/17/2015 0841   LDLCALC 125 (H) 08/05/2018 1301   Hepatic Function Panel     Component Value Date/Time   PROT 7.6 08/05/2018 1301   ALBUMIN 4.4 08/05/2018 1301   AST 26 08/05/2018 1301   ALT 40 08/05/2018 1301   ALKPHOS 66 08/05/2018 1301   BILITOT 0.3 08/05/2018 1301   BILIDIR 0.1 11/07/2015 1454      Component Value Date/Time   TSH 2.27 09/02/2018 0913   TSH 1.370 12/24/2016 1021   TSH 1.39 03/17/2015 0841    Ref. Range 08/05/2018 13:01   Vitamin D, 25-Hydroxy Latest Ref Range: 30.0 - 100.0 ng/mL 54.7    OBESITY BEHAVIORAL INTERVENTION VISIT  Today's visit [name] #40  Starting weight: 309 lbs Starting date: 12/24/2016 Today's weight: 283 lbs  Today's date: 01/19/2019 Total lbs lost to date: 26    01/19/2019  Height  (1.753 m)  Weight 283 lb (128.4 kg)  BMI (Calculated) 41.77  BLOOD PRESSURE - SYSTOLIC 119  BLOOD PRESSURE - DIASTOLIC 78   Body Fat % 34 %  Total Body Water (lbs) 121 lbs   ASK: We discussed the diagnosis of obesity with [name] today and Baruc agreed to give [name] permission to discuss obesity behavioral modification therapy today.  ASSESS: Theador has the diagnosis of obesity and his BMI today is 41.77. Neizan is in the action stage of change.  ADVISE: Robyn [name] educated on the multiple health risks of obesity as well as the benefit of weight loss to improve his health. He [name] advised of the need for long term treatment and the importance of lifestyle modifications to improve his current health and to decrease his risk of future health problems.  AGREE: Multiple dietary modification options and treatment options were discussed and  Matty agreed to follow the recommendations documented in the above note.  ARRANGE: Gorge [name] educated on the importance of frequent visits to treat obesity as outlined per CMS and USPSTF guidelines and agreed to schedule his next follow up appointment today.  Fernanda Drum, am acting as transcriptionist for Alois Cliche, PA-C I, Alois Cliche, PA-C have reviewed above note and agree with its content

## 2019-01-20 LAB — COMPREHENSIVE METABOLIC PANEL WITH GFR
ALT: 35 [IU]/L (ref 0–44)
AST: 30 [IU]/L (ref 0–40)
Albumin/Globulin Ratio: 1.1 — ABNORMAL LOW (ref 1.2–2.2)
Albumin: 4.2 g/dL (ref 4.0–5.0)
Alkaline Phosphatase: 60 [IU]/L (ref 39–117)
BUN/Creatinine Ratio: 16 (ref 9–20)
BUN: 19 mg/dL (ref 6–24)
Bilirubin Total: 0.7 mg/dL (ref 0.0–1.2)
CO2: 26 mmol/L (ref 20–29)
Calcium: 9.6 mg/dL (ref 8.7–10.2)
Chloride: 97 mmol/L (ref 96–106)
Creatinine, Ser: 1.22 mg/dL (ref 0.76–1.27)
GFR calc Af Amer: 82 mL/min/{1.73_m2}
GFR calc non Af Amer: 71 mL/min/{1.73_m2}
Globulin, Total: 3.8 g/dL (ref 1.5–4.5)
Glucose: 80 mg/dL (ref 65–99)
Potassium: 3.6 mmol/L (ref 3.5–5.2)
Sodium: 142 mmol/L (ref 134–144)
Total Protein: 8 g/dL (ref 6.0–8.5)

## 2019-01-20 LAB — LIPID PANEL WITH LDL/HDL RATIO
Cholesterol, Total: 176 mg/dL (ref 100–199)
HDL: 37 mg/dL — ABNORMAL LOW
LDL Calculated: 121 mg/dL — ABNORMAL HIGH (ref 0–99)
LDl/HDL Ratio: 3.3 ratio (ref 0.0–3.6)
Triglycerides: 92 mg/dL (ref 0–149)
VLDL Cholesterol Cal: 18 mg/dL (ref 5–40)

## 2019-01-20 LAB — VITAMIN D 25 HYDROXY (VIT D DEFICIENCY, FRACTURES): Vit D, 25-Hydroxy: 58.1 ng/mL (ref 30.0–100.0)

## 2019-01-20 LAB — HEMOGLOBIN A1C
ESTIMATED AVERAGE GLUCOSE: 105 mg/dL
Hgb A1c MFr Bld: 5.3 % (ref 4.8–5.6)

## 2019-01-20 LAB — INSULIN, RANDOM: INSULIN: 23.1 u[IU]/mL (ref 2.6–24.9)

## 2019-02-01 MED FILL — BUPROPION HCL SR 150 MG TAB: 150 | 30 days supply | Qty: 30 | Fill #0

## 2019-02-08 ENCOUNTER — Encounter (INDEPENDENT_AMBULATORY_CARE_PROVIDER_SITE_OTHER): Payer: Self-pay

## 2019-02-09 ENCOUNTER — Ambulatory Visit (INDEPENDENT_AMBULATORY_CARE_PROVIDER_SITE_OTHER): Payer: 59 | Admitting: Physician Assistant

## 2019-02-09 ENCOUNTER — Encounter (INDEPENDENT_AMBULATORY_CARE_PROVIDER_SITE_OTHER): Payer: Self-pay | Admitting: Physician Assistant

## 2019-02-09 ENCOUNTER — Other Ambulatory Visit: Payer: Self-pay

## 2019-02-09 VITALS — BP 132/79 | HR 66 | Temp 98.3°F | Ht 69.0 in | Wt 287.0 lb

## 2019-02-09 DIAGNOSIS — E7849 Other hyperlipidemia: Secondary | ICD-10-CM | POA: Diagnosis not present

## 2019-02-09 DIAGNOSIS — Z6841 Body Mass Index (BMI) 40.0 and over, adult: Secondary | ICD-10-CM | POA: Diagnosis not present

## 2019-02-09 NOTE — Progress Notes (Signed)
Office: [phone]  /  Fax: [phone]   HPI:   Chief Complaint: OBESITY Daniel [name] is here to discuss his progress with his obesity treatment plan. He is on the Category 3 plan and is following his eating plan approximately 60% of the time. He states he is doing cardio and weights 60 minutes 3 times per week. Daniel [name] reports that due to stress at work he has not been eating and has not been meal planning. His weight is 287 lb (130.2 kg) today and has had a weight gain of 4 lbs since his last visit. He has lost 22 lbs since starting treatment with [name].  Hyperlipidemia Daniel [name] has hyperlipidemia and has been trying to improve his cholesterol levels with intensive lifestyle modification including a low saturated fat diet, exercise and weight loss. Daniel [name]'s last levels were not at goal. He is currently on no medication and denies any chest pain.  ASSESSMENT AND PLAN:  Other hyperlipidemia  Class 3 severe obesity with serious comorbidity and body mass index (BMI) of 40.0 to 44.9 in adult, unspecified obesity type (HCC)  PLAN:  Hyperlipidemia Daniel [name] was informed of the American Heart Association Guidelines emphasizing intensive lifestyle modifications as the first line treatment for hyperlipidemia. We discussed many lifestyle modifications today in depth, and Daniel [name] will continue to work on decreasing saturated fats such as fatty red meat, butter and many fried foods. He will also increase vegetables and lean protein in his diet and continue to work on exercise and weight loss efforts.  I spent > than 50% of the 15 minute visit on counseling as documented in the note.  Obesity Daniel [name] is currently in the action stage of change. As such, his goal is to continue with weight loss efforts. He has agreed to follow the Category 3 plan. Daniel [name] has been instructed to work up to a goal of 150 minutes of combined cardio and strengthening exercise per week for weight loss and overall health benefits.  We discussed the following Behavioral Modification Strategies today: work on meal planning and easy cooking plans and better snacking choices.  Daniel [name] has agreed to follow-up with our clinic in 3 weeks. He was informed of the importance of frequent follow-up visits to maximize his success with intensive lifestyle modifications for his multiple health conditions.  ALLERGIES: Allergies  Allergen Reactions  . Bee Venom Anaphylaxis  . Shellfish Allergy Anaphylaxis    MEDICATIONS: Current Outpatient Medications on File Prior to Visit  Medication Sig Dispense Refill  . albuterol (VENTOLIN HFA) 108 (90 Base) MCG/ACT inhaler INHALE 2 PUFFS INTO THE LUNGS EVERY 4 HOURS AS NEEDED FOR WHEEZING OR SHORTNESS OF BREATH. 18 g 5  . buPROPion (WELLBUTRIN SR) 150 MG 12 hr tablet Take 1 tablet (150 mg total) by mouth daily. 30 tablet 0  . chlorthalidone (HYGROTON) 25 MG tablet Take 1 tablet (25 mg total) by mouth daily. 30 tablet 0  . diltiazem (CARDIZEM LA) 240 MG 24 hr tablet Take 1 tablet (240 mg total) by mouth daily. 30 tablet 0  . EPINEPHrine (EPIPEN 2-PAK) 0.3 mg/0.3 mL SOAJ injection Inject 0.3 mLs (0.3 mg total) into the muscle once. 1 Device 11  . etodolac (LODINE) 400 MG tablet Take 1 tablet (400 mg total) by mouth 2 (two) times daily as needed. 60 tablet 3  . levocetirizine (XYZAL) 5 MG tablet Take 1 tablet (5 mg total) by mouth every evening. 90 tablet 3  . montelukast (SINGULAIR) 10 MG tablet Take 1 tablet (10 mg total) by mouth  at bedtime. 90 tablet 3  . Multiple Vitamin (MULTIVITAMIN WITH MINERALS) TABS Take 1 tablet by mouth daily.    [name] omeprazole (PRILOSEC) 20 MG capsule TAKE 1 CAPSULE BY MOUTH ONCE DAILY 90 capsule 3  . polyethylene glycol powder (GLYCOLAX/MIRALAX) powder Take 17 g by mouth daily as needed. 3350 g 1  . Polyvinyl Alcohol-Povidone (CLEAR EYES ALL SEASONS) 5-6 MG/ML SOLN Apply 1 drop to eye once as needed. Reported on 02/01/2016     No current facility-administered medications  on file prior to visit.     PAST MEDICAL HISTORY: Past Medical History:  Diagnosis Date  . Asthma   . GERD (gastroesophageal reflux disease)   . Hypertension     PAST SURGICAL HISTORY: History reviewed. No pertinent surgical history.  SOCIAL HISTORY: Social History   Tobacco Use  . Smoking status: Never Smoker  . Smokeless tobacco: Never Used  Substance Use Topics  . Alcohol use: No    Alcohol/week: 0.0 standard drinks  . Drug use: No    FAMILY HISTORY: Family History  Problem Relation Age of Onset  . Hyperlipidemia Unknown   . Hypertension Unknown   . Stroke Unknown   . Hypertension Mother   . Hyperlipidemia Mother   . Obesity Mother   . Sudden death Father    ROS: Review of Systems  Constitutional: Negative for weight loss.  Cardiovascular: Negative for chest pain.   PHYSICAL EXAM: Blood pressure 132/79, pulse 66, temperature 98.3 F (36.8 C), temperature source Oral, height 5\' 9"  (1.753 m), weight 287 lb (130.2 kg), SpO2 97 %. Body mass index is 42.38 kg/m. Physical Exam Vitals signs reviewed.  Constitutional:      Appearance: Normal appearance. He is obese.  Cardiovascular:     Rate and Rhythm: Normal rate.     Pulses: Normal pulses.  Pulmonary:     Effort: Pulmonary effort is normal.     Breath sounds: Normal breath sounds.  Musculoskeletal: Normal range of motion.  Skin:    General: Skin is warm and dry.  Neurological:     Mental Status: He is alert and oriented to person, place, and time.  Psychiatric:        Behavior: Behavior normal.   RECENT LABS AND TESTS: BMET    Component Value Date/Time   NA 142 01/19/2019 1036   K 3.6 01/19/2019 1036   CL 97 01/19/2019 1036   CO2 26 01/19/2019 1036   GLUCOSE 80 01/19/2019 1036   GLUCOSE 103 (H) 11/07/2015 1454   BUN 19 01/19/2019 1036   CREATININE 1.22 01/19/2019 1036   CALCIUM 9.6 01/19/2019 1036   GFRNONAA 71 01/19/2019 1036   GFRAA 82 01/19/2019 1036   Lab Results  Component Value  Date   HGBA1C 5.3 01/19/2019   HGBA1C 5.4 08/05/2018   HGBA1C 5.4 11/11/2017   HGBA1C 5.3 08/06/2017   HGBA1C 5.6 04/08/2017   Lab Results  Component Value Date   INSULIN 23.1 01/19/2019   INSULIN 21.7 08/05/2018   INSULIN 23.2 11/11/2017   INSULIN 27.6 (H) 08/06/2017   INSULIN 28.9 (H) 04/08/2017   CBC    Component Value Date/Time   WBC 6.6 12/24/2016 1021   WBC 10.2 11/07/2015 1454   RBC 5.47 12/24/2016 1021   RBC 5.67 11/07/2015 1454   HGB 15.2 12/24/2016 1021   HCT 44.3 12/24/2016 1021   PLT 300.0 11/07/2015 1454   MCV 81 12/24/2016 1021   MCH 27.8 12/24/2016 1021   MCH 28.0 07/18/2012 2030  MCHC 34.3 12/24/2016 1021   MCHC 33.3 11/07/2015 1454   RDW 14.6 12/24/2016 1021   LYMPHSABS 2.7 12/24/2016 1021   MONOABS 0.9 11/07/2015 1454   EOSABS 0.2 12/24/2016 1021   BASOSABS 0.1 12/24/2016 1021   Iron/TIBC/Ferritin/ %Sat No results found for: IRON, TIBC, FERRITIN, IRONPCTSAT Lipid Panel     Component Value Date/Time   CHOL 176 01/19/2019 1036   TRIG 92 01/19/2019 1036   HDL 37 (L) 01/19/2019 1036   CHOLHDL 5 03/17/2015 0841   VLDL 22.8 03/17/2015 0841   LDLCALC 121 (H) 01/19/2019 1036   Hepatic Function Panel     Component Value Date/Time   PROT 8.0 01/19/2019 1036   ALBUMIN 4.2 01/19/2019 1036   AST 30 01/19/2019 1036   ALT 35 01/19/2019 1036   ALKPHOS 60 01/19/2019 1036   BILITOT 0.7 01/19/2019 1036   BILIDIR 0.1 11/07/2015 1454      Component Value Date/Time   TSH 2.27 09/02/2018 0913   TSH 1.370 12/24/2016 1021   TSH 1.39 03/17/2015 0841   Results for CANYON, LANGSTON (MRN [id number]) as of 02/09/2019 10:42  Ref. Range 01/19/2019 10:36  Vitamin D, 25-Hydroxy Latest Ref Range: 30.0 - 100.0 ng/mL 58.1   OBESITY BEHAVIORAL INTERVENTION VISIT  Today's visit was #40  Starting weight: 309 lbs Starting date: 12/24/2016 Today's weight: 287 lbs  Today's date: 02/09/2019 Total lbs lost to date: 22    02/09/2019  Height 5\' 9"  (1.753 m)  Weight 287  lb (130.2 kg)  BMI (Calculated) 42.36  BLOOD PRESSURE - SYSTOLIC 132  BLOOD PRESSURE - DIASTOLIC 79   Body Fat % 34.8 %  Total Body Water (lbs) 124.8 lbs   ASK: We discussed the diagnosis of obesity with [name] today and Eirik agreed to give [name] permission to discuss obesity behavioral modification therapy today.  ASSESS: Abbott has the diagnosis of obesity and his BMI today is 42.36. Capone is in the action stage of change.   ADVISE: Minoru was educated on the multiple health risks of obesity as well as the benefit of weight loss to improve his health. He was advised of the need for long term treatment and the importance of lifestyle modifications to improve his current health and to decrease his risk of future health problems.  AGREE: Multiple dietary modification options and treatment options were discussed and  Tomaz agreed to follow the recommendations documented in the above note.  ARRANGE: Bowden was educated on the importance of frequent visits to treat obesity as outlined per CMS and USPSTF guidelines and agreed to schedule his next follow up appointment today.  Fernanda Drum, am acting as transcriptionist for Alois Cliche, PA-C I, Alois Cliche, PA-C have reviewed above note and agree with its content

## 2019-02-13 MED FILL — MONTELUKAST SOD 10 MG TAB: 10 | 90 days supply | Qty: 90 | Fill #3

## 2019-02-13 MED FILL — LEVOCETIRIZINE 5 MG TABLET: 5 | 90 days supply | Qty: 90 | Fill #3

## 2019-02-16 ENCOUNTER — Encounter (INDEPENDENT_AMBULATORY_CARE_PROVIDER_SITE_OTHER): Payer: Self-pay

## 2019-03-02 ENCOUNTER — Ambulatory Visit (INDEPENDENT_AMBULATORY_CARE_PROVIDER_SITE_OTHER): Payer: 59 | Admitting: Physician Assistant

## 2019-03-02 ENCOUNTER — Other Ambulatory Visit: Payer: Self-pay

## 2019-03-02 ENCOUNTER — Encounter (INDEPENDENT_AMBULATORY_CARE_PROVIDER_SITE_OTHER): Payer: Self-pay | Admitting: Physician Assistant

## 2019-03-02 DIAGNOSIS — Z6841 Body Mass Index (BMI) 40.0 and over, adult: Secondary | ICD-10-CM | POA: Diagnosis not present

## 2019-03-02 DIAGNOSIS — F3289 Other specified depressive episodes: Secondary | ICD-10-CM

## 2019-03-02 DIAGNOSIS — I10 Essential (primary) hypertension: Secondary | ICD-10-CM

## 2019-03-02 MED ORDER — DILTIAZEM HCL ER COATED BEADS 240 MG PO TB24: 240.0000 mg | ORAL_TABLET | Freq: Every day | ORAL | 0 refills | Status: DC

## 2019-03-02 MED ORDER — BUPROPION HCL ER (SR) 150 MG PO TB12: 150.0000 mg | ORAL_TABLET | Freq: Every day | ORAL | 0 refills | Status: DC

## 2019-03-02 MED ORDER — CHLORTHALIDONE 25 MG PO TABS: 25.0000 mg | ORAL_TABLET | Freq: Every day | ORAL | 0 refills | Status: DC

## 2019-03-02 MED FILL — BUPROPION HCL SR 150 MG TAB: 150 | 30 days supply | Qty: 30 | Fill #0

## 2019-03-02 MED FILL — DILTIAZEM ER 240 MG TABLET: 240 | 30 days supply | Qty: 30 | Fill #0

## 2019-03-02 MED FILL — CHLORTHALIDONE 25 MG TABS: 25 | 30 days supply | Qty: 30 | Fill #0

## 2019-03-02 NOTE — Progress Notes (Signed)
Office: [phone]  /  Fax: [phone] TeleHealth Visit:  Daniel [name] has verbally consented to this TeleHealth visit today. The patient is located at work, the provider is located at the [facility] office. The participants in this visit include the listed provider and patient. The visit was conducted today via Webex.  HPI:   Chief Complaint: OBESITY Daniel [name] is here to discuss his progress with his obesity treatment plan. He is on the Category 3 plan and is following his eating plan approximately 45-50% of the time. He states he is exercising 0 minutes 0 times per week. Daniel [name] reports that he has been stressed with work and has not been eating all the food on the plan. He has stopped meal planning and prepping.  We were unable to weigh the patient today for this TeleHealth visit. He feels as if he has maintained his weight since his last visit. He has lost 22 lbs since starting treatment with [name].  Hypertension Daniel [name] is a 48 y.o. male with hypertension and is on chlorthalidone and diltiazem.  Daniel [name] Sees denies chest pain or shortness of breath on exertion. He is working weight loss to help control his blood pressure with the goal of decreasing his risk of heart attack and stroke.   Depression with emotional eating behaviors Daniel [name] is struggling with emotional eating and using food for comfort to the extent that it is negatively impacting his health. He often snacks when he is not hungry. Daniel [name] sometimes feels he is out of control and then feels guilty that he made poor food choices. He has been working on behavior modification techniques to help reduce his emotional eating and has been somewhat successful. He shows no sign of suicidal or homicidal ideations. Daniel [name] is on bupropion.  Depression screen Daniel [name] 2/9 09/02/2018 12/24/2016  Decreased Interest 0 0  Down, Depressed, Hopeless 0 0  PHQ - 2 Score 0 0  Altered sleeping - 0  Tired, decreased energy - 1   Change in appetite - 0  Feeling bad or failure about yourself  - 0  Trouble concentrating - 1  Moving slowly or fidgety/restless - 0  Suicidal thoughts - 0  PHQ-9 Score - 2   ASSESSMENT AND PLAN:  Essential hypertension - Plan: chlorthalidone (HYGROTON) 25 MG tablet, diltiazem (CARDIZEM LA) 240 MG 24 hr tablet  Other depression - with emotional eating  - Plan: buPROPion (WELLBUTRIN SR) 150 MG 12 hr tablet  Class 3 severe obesity with serious comorbidity and body mass index (BMI) of 40.0 to 44.9 in adult, unspecified obesity type (HCC)  PLAN:  Hypertension We discussed sodium restriction, working on healthy weight loss, and a regular exercise program as the means to achieve improved blood pressure control. Daniel [name] agreed with this plan and agreed to follow up as directed. We will continue to monitor his blood pressure as well as his progress with the above lifestyle modifications. [name] was given a refill on his chlorthalidone #30 with 0 refills and a refill on his diltiazem #30 with 0 refills. He agrees to follow-up with our clinic in 3 weeks. He will watch for signs of hypotension as he continues his lifestyle modifications.  Depression with Emotional Eating Behaviors We discussed behavior modification techniques today to help Daniel [name] deal with his emotional eating and depression. He was given a refill on his bupropion #30 with 0 refills and agrees to follow-up with our clinic in 3 weeks.  Obesity Daniel [name] is currently in the  action stage of change. As such, his goal is to continue with weight loss efforts. He has agreed to follow the Category 3 plan. Daniel [name] has been instructed to work up to a goal of 150 minutes of combined cardio and strengthening exercise per week for weight loss and overall health benefits. We discussed the following Behavioral Modification Strategies today: no skipping meals, work on meal planning, easy cooking plans, and keeping healthy foods in the home.  Daniel [name]  has agreed to follow-up with our clinic in 3 weeks. He was informed of the importance of frequent follow-up visits to maximize his success with intensive lifestyle modifications for his multiple health conditions.  ALLERGIES: Allergies  Allergen Reactions  . Bee Venom Anaphylaxis  . Shellfish Allergy Anaphylaxis    MEDICATIONS: Current Outpatient Medications on File Prior to Visit  Medication Sig Dispense Refill  . albuterol (VENTOLIN HFA) 108 (90 Base) MCG/ACT inhaler INHALE 2 PUFFS INTO THE LUNGS EVERY 4 HOURS AS NEEDED FOR WHEEZING OR SHORTNESS OF BREATH. 18 g 5  . EPINEPHrine (EPIPEN 2-PAK) 0.3 mg/0.3 mL SOAJ injection Inject 0.3 mLs (0.3 mg total) into the muscle once. 1 Device 11  . etodolac (LODINE) 400 MG tablet Take 1 tablet (400 mg total) by mouth 2 (two) times daily as needed. 60 tablet 3  . levocetirizine (XYZAL) 5 MG tablet Take 1 tablet (5 mg total) by mouth every evening. 90 tablet 3  . montelukast (SINGULAIR) 10 MG tablet Take 1 tablet (10 mg total) by mouth at bedtime. 90 tablet 3  . Multiple Vitamin (MULTIVITAMIN WITH MINERALS) TABS Take 1 tablet by mouth daily.    [name] omeprazole (PRILOSEC) 20 MG capsule TAKE 1 CAPSULE BY MOUTH ONCE DAILY 90 capsule 3  . polyethylene glycol powder (GLYCOLAX/MIRALAX) powder Take 17 g by mouth daily as needed. 3350 g 1  . Polyvinyl Alcohol-Povidone (CLEAR EYES ALL SEASONS) 5-6 MG/ML SOLN Apply 1 drop to eye once as needed. Reported on 02/01/2016     No current facility-administered medications on file prior to visit.     PAST MEDICAL HISTORY: Past Medical History:  Diagnosis Date  . Asthma   . GERD (gastroesophageal reflux disease)   . Hypertension     PAST SURGICAL HISTORY: History reviewed. No pertinent surgical history.  SOCIAL HISTORY: Social History   Tobacco Use  . Smoking status: Never Smoker  . Smokeless tobacco: Never Used  Substance Use Topics  . Alcohol use: No    Alcohol/week: 0.0 standard drinks  . Drug use: No     FAMILY HISTORY: Family History  Problem Relation Age of Onset  . Hyperlipidemia Unknown   . Hypertension Unknown   . Stroke Unknown   . Hypertension Mother   . Hyperlipidemia Mother   . Obesity Mother   . Sudden death Father    ROS: Review of Systems  Respiratory: Negative for shortness of breath.   Cardiovascular: Negative for chest pain.  Psychiatric/Behavioral: Positive for depression (emotional eating). Negative for suicidal ideas.       Negative for homicidal ideas.   PHYSICAL EXAM: Pt in no acute distress  RECENT LABS AND TESTS: BMET    Component Value Date/Time   NA 142 01/19/2019 1036   K 3.6 01/19/2019 1036   CL 97 01/19/2019 1036   CO2 26 01/19/2019 1036   GLUCOSE 80 01/19/2019 1036   GLUCOSE 103 (H) 11/07/2015 1454   BUN 19 01/19/2019 1036   CREATININE 1.22 01/19/2019 1036   CALCIUM 9.6 01/19/2019 1036  GFRNONAA 71 01/19/2019 1036   GFRAA 82 01/19/2019 1036   Lab Results  Component Value Date   HGBA1C 5.3 01/19/2019   HGBA1C 5.4 08/05/2018   HGBA1C 5.4 11/11/2017   HGBA1C 5.3 08/06/2017   HGBA1C 5.6 04/08/2017   Lab Results  Component Value Date   INSULIN 23.1 01/19/2019   INSULIN 21.7 08/05/2018   INSULIN 23.2 11/11/2017   INSULIN 27.6 (H) 08/06/2017   INSULIN 28.9 (H) 04/08/2017   CBC    Component Value Date/Time   WBC 6.6 12/24/2016 1021   WBC 10.2 11/07/2015 1454   RBC 5.47 12/24/2016 1021   RBC 5.67 11/07/2015 1454   HGB 15.2 12/24/2016 1021   HCT 44.3 12/24/2016 1021   PLT 300.0 11/07/2015 1454   MCV 81 12/24/2016 1021   MCH 27.8 12/24/2016 1021   MCH 28.0 07/18/2012 2030   MCHC 34.3 12/24/2016 1021   MCHC 33.3 11/07/2015 1454   RDW 14.6 12/24/2016 1021   LYMPHSABS 2.7 12/24/2016 1021   MONOABS 0.9 11/07/2015 1454   EOSABS 0.2 12/24/2016 1021   BASOSABS 0.1 12/24/2016 1021   Iron/TIBC/Ferritin/ %Sat No results found for: IRON, TIBC, FERRITIN, IRONPCTSAT Lipid Panel     Component Value Date/Time   CHOL 176 01/19/2019  1036   TRIG 92 01/19/2019 1036   HDL 37 (L) 01/19/2019 1036   CHOLHDL 5 03/17/2015 0841   VLDL 22.8 03/17/2015 0841   LDLCALC 121 (H) 01/19/2019 1036   Hepatic Function Panel     Component Value Date/Time   PROT 8.0 01/19/2019 1036   ALBUMIN 4.2 01/19/2019 1036   AST 30 01/19/2019 1036   ALT 35 01/19/2019 1036   ALKPHOS 60 01/19/2019 1036   BILITOT 0.7 01/19/2019 1036   BILIDIR 0.1 11/07/2015 1454      Component Value Date/Time   TSH 2.27 09/02/2018 0913   TSH 1.370 12/24/2016 1021   TSH 1.39 03/17/2015 0841   Results for Marchia BondROOKS, Judd M (MRN [id number]) as of 03/02/2019 16:45  Ref. Range 01/19/2019 10:36  Vitamin D, 25-Hydroxy Latest Ref Range: 30.0 - 100.0 ng/mL 58.1    I, Marianna Paymentenise Haag, am acting as Energy managertranscriptionist for [employer] Syona Wroblewski, PA-C I, Alois Clicheracey Jalia Zuniga, PA-C have reviewed above note and agree with its content

## 2019-03-22 ENCOUNTER — Ambulatory Visit (INDEPENDENT_AMBULATORY_CARE_PROVIDER_SITE_OTHER): Payer: 59 | Admitting: Physician Assistant

## 2019-03-22 ENCOUNTER — Other Ambulatory Visit: Payer: Self-pay

## 2019-03-22 ENCOUNTER — Encounter (INDEPENDENT_AMBULATORY_CARE_PROVIDER_SITE_OTHER): Payer: Self-pay | Admitting: Physician Assistant

## 2019-03-22 DIAGNOSIS — I10 Essential (primary) hypertension: Secondary | ICD-10-CM | POA: Diagnosis not present

## 2019-03-22 DIAGNOSIS — Z6841 Body Mass Index (BMI) 40.0 and over, adult: Secondary | ICD-10-CM

## 2019-03-22 NOTE — Progress Notes (Signed)
Office: [phone]  /  Fax: [phone] TeleHealth Visit:  Daniel [name] has verbally consented to this TeleHealth visit today. The patient is located at work, the provider is located at the [facility] office. The participants in this visit include the listed provider and patient. The visit was conducted today via Webex.  HPI:   Chief Complaint: OBESITY Bruin is here to discuss his progress with his obesity treatment plan. He is on the Category 3 plan and is following his eating plan approximately 60% of the time. He states he is walking 30 minutes 7 times per week. Daniel [name] reports that he has been walking more but is not eating enough throughout the day due to his work schedule. We were unable to weigh the patient today for this TeleHealth visit. He feels as if he has lost 2.5 lbs since his last visit. He has lost 22 lbs since starting treatment with [name].  Hypertension EFSTRATIOS [name] is a 48 y.o. male with hypertension and is on chlorthalidone and Diltiazem.  Evander Ryce Kilian denies chest pain or headache. He is working weight loss to help control his blood pressure with the goal of decreasing his risk of heart attack and stroke.   ASSESSMENT AND PLAN:  Essential hypertension  Class 3 severe obesity with serious comorbidity and body mass index (BMI) of 40.0 to 44.9 in adult, unspecified obesity type (HCC)  PLAN:  Hypertension We discussed sodium restriction, working on healthy weight loss, and a regular exercise program as the means to achieve improved blood pressure control. Daniel [name] agreed with this plan and agreed to follow up as directed. We will continue to monitor his blood pressure as well as his progress with the above lifestyle modifications. He will continue his medications as prescribed and will watch for signs of hypotension as he continues his lifestyle modifications.  Obesity Daniel [name] is currently in the action stage of change. As such, his goal is to  continue with weight loss efforts. He has agreed to follow the Category 3 plan. Daniel [name] has been instructed to work up to a goal of 150 minutes of combined cardio and strengthening exercise per week for weight loss and overall health benefits. We discussed the following Behavioral Modification Strategies today: increasing lean protein intake, work on meal planning and easy cooking plans.  Daniel [name] has agreed to follow-up with our clinic in 2 weeks. He was informed of the importance of frequent follow-up visits to maximize his success with intensive lifestyle modifications for his multiple health conditions.  I spent > than 50% of the 25 minute visit on counseling as documented in the note.   ALLERGIES: Allergies  Allergen Reactions  . Bee Venom Anaphylaxis  . Shellfish Allergy Anaphylaxis    MEDICATIONS: Current Outpatient Medications on File Prior to Visit  Medication Sig Dispense Refill  . albuterol (VENTOLIN HFA) 108 (90 Base) MCG/ACT inhaler INHALE 2 PUFFS INTO THE LUNGS EVERY 4 HOURS AS NEEDED FOR WHEEZING OR SHORTNESS OF BREATH. 18 g 5  . buPROPion (WELLBUTRIN SR) 150 MG 12 hr tablet Take 1 tablet (150 mg total) by mouth daily. 30 tablet 0  . chlorthalidone (HYGROTON) 25 MG tablet Take 1 tablet (25 mg total) by mouth daily. 30 tablet 0  . diltiazem (CARDIZEM LA) 240 MG 24 hr tablet Take 1 tablet (240 mg total) by mouth daily. 30 tablet 0  . EPINEPHrine (EPIPEN 2-PAK) 0.3 mg/0.3 mL SOAJ injection Inject 0.3 mLs (0.3 mg total) into the muscle once. 1  Device 11  . etodolac (LODINE) 400 MG tablet Take 1 tablet (400 mg total) by mouth 2 (two) times daily as needed. 60 tablet 3  . levocetirizine (XYZAL) 5 MG tablet Take 1 tablet (5 mg total) by mouth every evening. 90 tablet 3  . montelukast (SINGULAIR) 10 MG tablet Take 1 tablet (10 mg total) by mouth at bedtime. 90 tablet 3  . Multiple Vitamin (MULTIVITAMIN WITH MINERALS) TABS Take 1 tablet by mouth daily.    [name] omeprazole (PRILOSEC) 20 MG  capsule TAKE 1 CAPSULE BY MOUTH ONCE DAILY 90 capsule 3  . polyethylene glycol powder (GLYCOLAX/MIRALAX) powder Take 17 g by mouth daily as needed. 3350 g 1  . Polyvinyl Alcohol-Povidone (CLEAR EYES ALL SEASONS) 5-6 MG/ML SOLN Apply 1 drop to eye once as needed. Reported on 02/01/2016     No current facility-administered medications on file prior to visit.     PAST MEDICAL HISTORY: Past Medical History:  Diagnosis Date  . Asthma   . GERD (gastroesophageal reflux disease)   . Hypertension     PAST SURGICAL HISTORY: History reviewed. No pertinent surgical history.  SOCIAL HISTORY: Social History   Tobacco Use  . Smoking status: Never Smoker  . Smokeless tobacco: Never Used  Substance Use Topics  . Alcohol use: No    Alcohol/week: 0.0 standard drinks  . Drug use: No    FAMILY HISTORY: Family History  Problem Relation Age of Onset  . Hyperlipidemia Unknown   . Hypertension Unknown   . Stroke Unknown   . Hypertension Mother   . Hyperlipidemia Mother   . Obesity Mother   . Sudden death Father    ROS: Review of Systems  Cardiovascular: Negative for chest pain.  Neurological: Negative for headaches.   PHYSICAL EXAM: Pt in no acute distress  RECENT LABS AND TESTS: BMET    Component Value Date/Time   NA 142 01/19/2019 1036   K 3.6 01/19/2019 1036   CL 97 01/19/2019 1036   CO2 26 01/19/2019 1036   GLUCOSE 80 01/19/2019 1036   GLUCOSE 103 (H) 11/07/2015 1454   BUN 19 01/19/2019 1036   CREATININE 1.22 01/19/2019 1036   CALCIUM 9.6 01/19/2019 1036   GFRNONAA 71 01/19/2019 1036   GFRAA 82 01/19/2019 1036   Lab Results  Component Value Date   HGBA1C 5.3 01/19/2019   HGBA1C 5.4 08/05/2018   HGBA1C 5.4 11/11/2017   HGBA1C 5.3 08/06/2017   HGBA1C 5.6 04/08/2017   Lab Results  Component Value Date   INSULIN 23.1 01/19/2019   INSULIN 21.7 08/05/2018   INSULIN 23.2 11/11/2017   INSULIN 27.6 (H) 08/06/2017   INSULIN 28.9 (H) 04/08/2017   CBC    Component  Value Date/Time   WBC 6.6 12/24/2016 1021   WBC 10.2 11/07/2015 1454   RBC 5.47 12/24/2016 1021   RBC 5.67 11/07/2015 1454   HGB 15.2 12/24/2016 1021   HCT 44.3 12/24/2016 1021   PLT 300.0 11/07/2015 1454   MCV 81 12/24/2016 1021   MCH 27.8 12/24/2016 1021   MCH 28.0 07/18/2012 2030   MCHC 34.3 12/24/2016 1021   MCHC 33.3 11/07/2015 1454   RDW 14.6 12/24/2016 1021   LYMPHSABS 2.7 12/24/2016 1021   MONOABS 0.9 11/07/2015 1454   EOSABS 0.2 12/24/2016 1021   BASOSABS 0.1 12/24/2016 1021   Iron/TIBC/Ferritin/ %Sat No results found for: IRON, TIBC, FERRITIN, IRONPCTSAT Lipid Panel     Component Value Date/Time   CHOL 176 01/19/2019 1036   TRIG 92  01/19/2019 1036   HDL 37 (L) 01/19/2019 1036   CHOLHDL 5 03/17/2015 0841   VLDL 22.8 03/17/2015 0841   LDLCALC 121 (H) 01/19/2019 1036   Hepatic Function Panel     Component Value Date/Time   PROT 8.0 01/19/2019 1036   ALBUMIN 4.2 01/19/2019 1036   AST 30 01/19/2019 1036   ALT 35 01/19/2019 1036   ALKPHOS 60 01/19/2019 1036   BILITOT 0.7 01/19/2019 1036   BILIDIR 0.1 11/07/2015 1454      Component Value Date/Time   TSH 2.27 09/02/2018 0913   TSH 1.370 12/24/2016 1021   TSH 1.39 03/17/2015 0841   Results for Marchia BondROOKS, Jomari M (MRN [id number]) as of 03/22/2019 16:46  Ref. Range 01/19/2019 10:36  Vitamin D, 25-Hydroxy Latest Ref Range: 30.0 - 100.0 ng/mL 58.1   I, Marianna Paymentenise Haag, am acting as Energy managertranscriptionist for [employer] Shirlette Scarber, PA-C I, Alois Clicheracey Kijuan Gallicchio, PA-C have reviewed above note and agree with its content

## 2019-04-07 ENCOUNTER — Other Ambulatory Visit: Payer: Self-pay

## 2019-04-07 ENCOUNTER — Ambulatory Visit (INDEPENDENT_AMBULATORY_CARE_PROVIDER_SITE_OTHER): Payer: 59 | Admitting: Physician Assistant

## 2019-04-07 DIAGNOSIS — Z6841 Body Mass Index (BMI) 40.0 and over, adult: Secondary | ICD-10-CM

## 2019-04-07 DIAGNOSIS — F3289 Other specified depressive episodes: Secondary | ICD-10-CM | POA: Diagnosis not present

## 2019-04-07 DIAGNOSIS — I10 Essential (primary) hypertension: Secondary | ICD-10-CM | POA: Diagnosis not present

## 2019-04-07 MED ORDER — BUPROPION HCL ER (SR) 150 MG PO TB12: 150.0000 mg | ORAL_TABLET | Freq: Every day | ORAL | 0 refills | Status: DC

## 2019-04-07 MED ORDER — CHLORTHALIDONE 25 MG PO TABS: 25.0000 mg | ORAL_TABLET | Freq: Every day | ORAL | 1 refills | Status: DC

## 2019-04-07 MED ORDER — DILTIAZEM HCL ER COATED BEADS 240 MG PO TB24: 240.0000 mg | ORAL_TABLET | Freq: Every day | ORAL | 1 refills | Status: DC

## 2019-04-07 MED FILL — DILTIAZEM ER 240 MG TABLET: 240 | 30 days supply | Qty: 30 | Fill #0

## 2019-04-07 MED FILL — BUPROPION HCL SR 150 MG TAB: 150 | 30 days supply | Qty: 30 | Fill #0

## 2019-04-07 MED FILL — CHLORTHALIDONE 25 MG TABLET: 25 | 30 days supply | Qty: 30 | Fill #0

## 2019-04-07 NOTE — Progress Notes (Signed)
Office: [phone]  /  Fax: [phone] TeleHealth Visit:  Marchia Bondracey M [name] has verbally consented to this TeleHealth visit today. The patient is located at work, the provider is located at the [facility] office. The participants in this visit include the listed provider and patient and any and all parties involved. The visit was conducted today via WebEx.  HPI:   Chief Complaint: OBESITY Daniel [name] is here to discuss his progress with his obesity treatment plan. He is on the keep a food journal with 1500 to 1600 calories and 100 grams of protein daily and the Category 3 plan and is following his eating plan approximately 25 % of the time. He states he is exercising 0 minutes 0 times per week. Daniel [name] reports his weight today is 287 pounds. He reports that he finds the bread on the plan to be a trigger food. We were unable to weigh the patient today for this TeleHealth visit. He feels as if he has gained weight since his last visit. He has lost 22 lbs since starting treatment with Daniel [name].  Hypertension Marchia Bondracey M Ekstrom is a 48 y.o. male with hypertension. Jamison Okaracey M Minchey denies chest pain or headache on Chlorthalidone and Diltiazem. He is working weight loss to help control his blood pressure with the goal of decreasing his risk of heart attack and stroke. [name] blood pressure is currently controlled.  Depression with emotional eating behaviors Daniel [name] is struggling with emotional eating and using food for comfort to the extent that it is negatively impacting his health. He often snacks when he is not hungry. Daniel [name] sometimes feels he is out of control and then feels guilty that he made poor food choices. He has been working on behavior modification techniques to help reduce his emotional eating and has been somewhat successful. He shows no sign of suicidal or homicidal ideations on Wellbutrin.  [facility] 12/24/2016  Decreased Interest 0 0  Down, Depressed,  Hopeless 0 0  PHQ - 2 Score 0 0  Altered sleeping - 0  Tired, decreased energy - 1  Change in appetite - 0  Feeling bad or failure about yourself  - 0  Trouble concentrating - 1  Moving slowly or fidgety/restless - 0  Suicidal thoughts - 0  PHQ-9 Score - 2    ASSESSMENT AND PLAN:  Essential hypertension - Plan: diltiazem (CARDIZEM LA) 240 MG 24 hr tablet, chlorthalidone (HYGROTON) 25 MG tablet  Other depression - with emotional eating  - Plan: buPROPion (WELLBUTRIN SR) 150 MG 12 hr tablet  Class 3 severe obesity with serious comorbidity and body mass index (BMI) of 40.0 to 44.9 in adult, unspecified obesity type (HCC)  PLAN:  Hypertension We discussed sodium restriction, working on healthy weight loss, and a regular exercise program as the means to achieve improved blood pressure control. Summer agreed with this plan and agreed to follow up as directed. We will continue to monitor his blood pressure as well as his progress with the above lifestyle modifications. Daniel [name] agrees to continue Chlorthalidone 25 mg daily #30 with no refills and Diltiazem 240 mg daily #30 with no refills and will watch for signs of hypotension as he continues his lifestyle modifications.  Depression with Emotional Eating Behaviors We discussed behavior modification techniques today to help Daniel [name] deal with his emotional eating and depression. He has agreed to continue Bupropion (Wellbutrin SR) 150 mg qd #30 with no refills and follow up as directed.  Obesity Daniel [name] is currently  in the action stage of change. As such, his goal is to continue with weight loss efforts He has agreed to keep a food journal with 1500 calories and 100 grams of protein daily and follow the Category 3 plan Daniel [name] has been instructed to work up to a goal of 150 minutes of combined cardio and strengthening exercise per week for weight loss and overall health benefits. We discussed the following Behavioral Modification Strategies today:  work on meal planning and easy cooking plans  Daniel [name] has agreed to follow up with our clinic in 3 weeks. He was informed of the importance of frequent follow up visits to maximize his success with intensive lifestyle modifications for his multiple health conditions.  ALLERGIES: Allergies  Allergen Reactions  . Bee Venom Anaphylaxis  . Shellfish Allergy Anaphylaxis    MEDICATIONS: Current Outpatient Medications on File Prior to Visit  Medication Sig Dispense Refill  . albuterol (VENTOLIN HFA) 108 (90 Base) MCG/ACT inhaler INHALE 2 PUFFS INTO THE LUNGS EVERY 4 HOURS AS NEEDED FOR WHEEZING OR SHORTNESS OF BREATH. 18 g 5  . EPINEPHrine (EPIPEN 2-PAK) 0.3 mg/0.3 mL SOAJ injection Inject 0.3 mLs (0.3 mg total) into the muscle once. 1 Device 11  . etodolac (LODINE) 400 MG tablet Take 1 tablet (400 mg total) by mouth 2 (two) times daily as needed. 60 tablet 3  . levocetirizine (XYZAL) 5 MG tablet Take 1 tablet (5 mg total) by mouth every evening. 90 tablet 3  . montelukast (SINGULAIR) 10 MG tablet Take 1 tablet (10 mg total) by mouth at bedtime. 90 tablet 3  . Multiple Vitamin (MULTIVITAMIN WITH MINERALS) TABS Take 1 tablet by mouth daily.    [name] omeprazole (PRILOSEC) 20 MG capsule TAKE 1 CAPSULE BY MOUTH ONCE DAILY 90 capsule 3  . polyethylene glycol powder (GLYCOLAX/MIRALAX) powder Take 17 g by mouth daily as needed. 3350 g 1  . Polyvinyl Alcohol-Povidone (CLEAR EYES ALL SEASONS) 5-6 MG/ML SOLN Apply 1 drop to eye once as needed. Reported on 02/01/2016     No current facility-administered medications on file prior to visit.     PAST MEDICAL HISTORY: Past Medical History:  Diagnosis Date  . Asthma   . GERD (gastroesophageal reflux disease)   . Hypertension     PAST SURGICAL HISTORY: No past surgical history on file.  SOCIAL HISTORY: Social History   Tobacco Use  . Smoking status: Never Smoker  . Smokeless tobacco: Never Used  Substance Use Topics  . Alcohol use: No    Alcohol/week:  0.0 standard drinks  . Drug use: No    FAMILY HISTORY: Family History  Problem Relation Age of Onset  . Hyperlipidemia Unknown   . Hypertension Unknown   . Stroke Unknown   . Hypertension Mother   . Hyperlipidemia Mother   . Obesity Mother   . Sudden death Father     ROS: Review of Systems  Constitutional: Negative for weight loss.  Cardiovascular: Negative for chest pain.  Neurological: Negative for headaches.  Psychiatric/Behavioral: Positive for depression. Negative for suicidal ideas.    PHYSICAL EXAM: Pt in no acute distress  RECENT LABS AND TESTS: BMET    Component Value Date/Time   NA 142 01/19/2019 1036   K 3.6 01/19/2019 1036   CL 97 01/19/2019 1036   CO2 26 01/19/2019 1036   GLUCOSE 80 01/19/2019 1036   GLUCOSE 103 (H) 11/07/2015 1454   BUN 19 01/19/2019 1036   CREATININE 1.22 01/19/2019 1036   CALCIUM 9.6 01/19/2019 1036  GFRNONAA 71 01/19/2019 1036   GFRAA 82 01/19/2019 1036   Lab Results  Component Value Date   HGBA1C 5.3 01/19/2019   HGBA1C 5.4 08/05/2018   HGBA1C 5.4 11/11/2017   HGBA1C 5.3 08/06/2017   HGBA1C 5.6 04/08/2017   Lab Results  Component Value Date   INSULIN 23.1 01/19/2019   INSULIN 21.7 08/05/2018   INSULIN 23.2 11/11/2017   INSULIN 27.6 (H) 08/06/2017   INSULIN 28.9 (H) 04/08/2017   CBC    Component Value Date/Time   WBC 6.6 12/24/2016 1021   WBC 10.2 11/07/2015 1454   RBC 5.47 12/24/2016 1021   RBC 5.67 11/07/2015 1454   HGB 15.2 12/24/2016 1021   HCT 44.3 12/24/2016 1021   PLT 300.0 11/07/2015 1454   MCV 81 12/24/2016 1021   MCH 27.8 12/24/2016 1021   MCH 28.0 07/18/2012 2030   MCHC 34.3 12/24/2016 1021   MCHC 33.3 11/07/2015 1454   RDW 14.6 12/24/2016 1021   LYMPHSABS 2.7 12/24/2016 1021   MONOABS 0.9 11/07/2015 1454   EOSABS 0.2 12/24/2016 1021   BASOSABS 0.1 12/24/2016 1021   Iron/TIBC/Ferritin/ %Sat No results found for: IRON, TIBC, FERRITIN, IRONPCTSAT Lipid Panel     Component Value Date/Time    CHOL 176 01/19/2019 1036   TRIG 92 01/19/2019 1036   HDL 37 (L) 01/19/2019 1036   CHOLHDL 5 03/17/2015 0841   VLDL 22.8 03/17/2015 0841   LDLCALC 121 (H) 01/19/2019 1036   Hepatic Function Panel     Component Value Date/Time   PROT 8.0 01/19/2019 1036   ALBUMIN 4.2 01/19/2019 1036   AST 30 01/19/2019 1036   ALT 35 01/19/2019 1036   ALKPHOS 60 01/19/2019 1036   BILITOT 0.7 01/19/2019 1036   BILIDIR 0.1 11/07/2015 1454      Component Value Date/Time   TSH 2.27 09/02/2018 0913   TSH 1.370 12/24/2016 1021   TSH 1.39 03/17/2015 0841     Ref. Range 01/19/2019 10:36  Vitamin D, 25-Hydroxy Latest Ref Range: 30.0 - 100.0 ng/mL 58.1    I, [place] Crane, am acting as Energy manager for [employer], PA-C I, Alois Cliche, PA-C have reviewed above note and agree with its content

## 2019-04-28 ENCOUNTER — Ambulatory Visit (INDEPENDENT_AMBULATORY_CARE_PROVIDER_SITE_OTHER): Payer: 59 | Admitting: Physician Assistant

## 2019-04-28 ENCOUNTER — Encounter (INDEPENDENT_AMBULATORY_CARE_PROVIDER_SITE_OTHER): Payer: Self-pay | Admitting: Physician Assistant

## 2019-04-28 ENCOUNTER — Other Ambulatory Visit: Payer: Self-pay

## 2019-04-28 DIAGNOSIS — E7849 Other hyperlipidemia: Secondary | ICD-10-CM

## 2019-04-28 DIAGNOSIS — Z6841 Body Mass Index (BMI) 40.0 and over, adult: Secondary | ICD-10-CM

## 2019-04-28 NOTE — Progress Notes (Signed)
Office: [phone]  /  Fax: [phone] TeleHealth Visit:  Daniel [name] has verbally consented to this TeleHealth visit today. The patient is located at work, the provider is located at the [facility] office. The participants in this visit include the listed provider and patient. The visit was conducted today via Webex (25 minutes).  HPI:   Chief Complaint: OBESITY Daniel [name] is here to discuss his progress with his obesity treatment plan. He is on the Category 3 plan and is following his eating plan approximately 50% of the time. He states he is walking 2 miles 3-4 times per week. [name] reports that he has been struggling with lunch due to change in projects at work. He is doing well with snacks and his other meals. He is going to start packing all of his food for the day. We were unable to weigh the patient today for this TeleHealth visit. He feels as if he has gained 2-3 lbs since his last visit. He has lost 22 lbs since starting treatment with [name].  Hyperlipidemia Daniel [name] has hyperlipidemia and has been trying to improve his cholesterol levels with intensive lifestyle modification including a low saturated fat diet, exercise and weight loss. He is on no medication and denies any chest pain.   ASSESSMENT AND PLAN:  Other hyperlipidemia  Class 3 severe obesity with serious comorbidity and body mass index (BMI) of 40.0 to 44.9 in adult, unspecified obesity type (HCC)  PLAN:  Hyperlipidemia Daniel [name] was informed of the American Heart Association Guidelines emphasizing intensive lifestyle modifications as the first line treatment for hyperlipidemia. We discussed many lifestyle modifications today in depth, and Daniel [name] will continue to work on decreasing saturated fats such as fatty red meat, butter and many fried foods. He will also increase vegetables and lean protein in his diet and continue to work on exercise and weight loss efforts.  Obesity Daniel [name] is currently in the  action stage of change. As such, his goal is to continue with weight loss efforts. He has agreed to follow the Category 3 plan. Daniel [name] has been instructed to work up to a goal of 150 minutes of combined cardio and strengthening exercise per week for weight loss and overall health benefits. We discussed the following Behavioral Modification Strategies today: work on meal planning and easy cooking plans, better snacking choices.  Daniel [name] has agreed to follow-up with our clinic in 3 weeks. He was informed of the importance of frequent follow-up visits to maximize his success with intensive lifestyle modifications for his multiple health conditions.  I spent > than 50% of the 25 minute visit on counseling as documented in the note.   ALLERGIES: Allergies  Allergen Reactions  . Bee Venom Anaphylaxis  . Shellfish Allergy Anaphylaxis    MEDICATIONS: Current Outpatient Medications on File Prior to Visit  Medication Sig Dispense Refill  . albuterol (VENTOLIN HFA) 108 (90 Base) MCG/ACT inhaler INHALE 2 PUFFS INTO THE LUNGS EVERY 4 HOURS AS NEEDED FOR WHEEZING OR SHORTNESS OF BREATH. 18 g 5  . buPROPion (WELLBUTRIN SR) 150 MG 12 hr tablet Take 1 tablet (150 mg total) by mouth daily. 30 tablet 0  . chlorthalidone (HYGROTON) 25 MG tablet Take 1 tablet (25 mg total) by mouth daily. 30 tablet 1  . diltiazem (CARDIZEM LA) 240 MG 24 hr tablet Take 1 tablet (240 mg total) by mouth daily. 30 tablet 1  . EPINEPHrine (EPIPEN 2-PAK) 0.3 mg/0.3 mL SOAJ injection Inject 0.3 mLs (0.3 mg total) into the  muscle once. 1 Device 11  . etodolac (LODINE) 400 MG tablet Take 1 tablet (400 mg total) by mouth 2 (two) times daily as needed. 60 tablet 3  . levocetirizine (XYZAL) 5 MG tablet Take 1 tablet (5 mg total) by mouth every evening. 90 tablet 3  . montelukast (SINGULAIR) 10 MG tablet Take 1 tablet (10 mg total) by mouth at bedtime. 90 tablet 3  . Multiple Vitamin (MULTIVITAMIN WITH MINERALS) TABS Take 1 tablet by  mouth daily.    [name] omeprazole (PRILOSEC) 20 MG capsule TAKE 1 CAPSULE BY MOUTH ONCE DAILY 90 capsule 3  . polyethylene glycol powder (GLYCOLAX/MIRALAX) powder Take 17 g by mouth daily as needed. 3350 g 1  . Polyvinyl Alcohol-Povidone (CLEAR EYES ALL SEASONS) 5-6 MG/ML SOLN Apply 1 drop to eye once as needed. Reported on 02/01/2016     No current facility-administered medications on file prior to visit.     PAST MEDICAL HISTORY: Past Medical History:  Diagnosis Date  . Asthma   . GERD (gastroesophageal reflux disease)   . Hypertension     PAST SURGICAL HISTORY: History reviewed. No pertinent surgical history.  SOCIAL HISTORY: Social History   Tobacco Use  . Smoking status: Never Smoker  . Smokeless tobacco: Never Used  Substance Use Topics  . Alcohol use: No    Alcohol/week: 0.0 standard drinks  . Drug use: No    FAMILY HISTORY: Family History  Problem Relation Age of Onset  . Hyperlipidemia Unknown   . Hypertension Unknown   . Stroke Unknown   . Hypertension Mother   . Hyperlipidemia Mother   . Obesity Mother   . Sudden death Father    ROS: Review of Systems  Cardiovascular: Negative for chest pain.   PHYSICAL EXAM: Pt in no acute distress  RECENT LABS AND TESTS: BMET    Component Value Date/Time   NA 142 01/19/2019 1036   K 3.6 01/19/2019 1036   CL 97 01/19/2019 1036   CO2 26 01/19/2019 1036   GLUCOSE 80 01/19/2019 1036   GLUCOSE 103 (H) 11/07/2015 1454   BUN 19 01/19/2019 1036   CREATININE 1.22 01/19/2019 1036   CALCIUM 9.6 01/19/2019 1036   GFRNONAA 71 01/19/2019 1036   GFRAA 82 01/19/2019 1036   Lab Results  Component Value Date   HGBA1C 5.3 01/19/2019   HGBA1C 5.4 08/05/2018   HGBA1C 5.4 11/11/2017   HGBA1C 5.3 08/06/2017   HGBA1C 5.6 04/08/2017   Lab Results  Component Value Date   INSULIN 23.1 01/19/2019   INSULIN 21.7 08/05/2018   INSULIN 23.2 11/11/2017   INSULIN 27.6 (H) 08/06/2017   INSULIN 28.9 (H) 04/08/2017   CBC     Component Value Date/Time   WBC 6.6 12/24/2016 1021   WBC 10.2 11/07/2015 1454   RBC 5.47 12/24/2016 1021   RBC 5.67 11/07/2015 1454   HGB 15.2 12/24/2016 1021   HCT 44.3 12/24/2016 1021   PLT 300.0 11/07/2015 1454   MCV 81 12/24/2016 1021   MCH 27.8 12/24/2016 1021   MCH 28.0 07/18/2012 2030   MCHC 34.3 12/24/2016 1021   MCHC 33.3 11/07/2015 1454   RDW 14.6 12/24/2016 1021   LYMPHSABS 2.7 12/24/2016 1021   MONOABS 0.9 11/07/2015 1454   EOSABS 0.2 12/24/2016 1021   BASOSABS 0.1 12/24/2016 1021   Iron/TIBC/Ferritin/ %Sat No results found for: IRON, TIBC, FERRITIN, IRONPCTSAT Lipid Panel     Component Value Date/Time   CHOL 176 01/19/2019 1036   TRIG 92 01/19/2019 1036  HDL 37 (L) 01/19/2019 1036   CHOLHDL 5 03/17/2015 0841   VLDL 22.8 03/17/2015 0841   LDLCALC 121 (H) 01/19/2019 1036   Hepatic Function Panel     Component Value Date/Time   PROT 8.0 01/19/2019 1036   ALBUMIN 4.2 01/19/2019 1036   AST 30 01/19/2019 1036   ALT 35 01/19/2019 1036   ALKPHOS 60 01/19/2019 1036   BILITOT 0.7 01/19/2019 1036   BILIDIR 0.1 11/07/2015 1454      Component Value Date/Time   TSH 2.27 09/02/2018 0913   TSH 1.370 12/24/2016 1021   TSH 1.39 03/17/2015 0841   Results for Marchia BondROOKS, Sofia M (MRN [id number]) as of 04/28/2019 14:12  Ref. Range 01/19/2019 10:36  Hemoglobin A1C Latest Ref Range: 4.8 - 5.6 % 5.3    I, Marianna Paymentenise Haag, am acting as Energy managertranscriptionist for [employer] Krishna [employer], PA-C I, Alois Clicheracey Jorene Kaylor, PA-C have reviewed above note and agree with its content

## 2019-05-12 MED FILL — LEVOCETIRIZINE 5 MG TABLET: 5 | 90 days supply | Qty: 90 | Fill #0

## 2019-05-12 MED FILL — MONTELUKAST SOD 10 MG TAB: 10 | 90 days supply | Qty: 90 | Fill #0

## 2019-05-17 ENCOUNTER — Telehealth (INDEPENDENT_AMBULATORY_CARE_PROVIDER_SITE_OTHER): Payer: 59 | Admitting: Physician Assistant

## 2019-05-17 ENCOUNTER — Other Ambulatory Visit: Payer: Self-pay

## 2019-05-17 ENCOUNTER — Encounter (INDEPENDENT_AMBULATORY_CARE_PROVIDER_SITE_OTHER): Payer: Self-pay | Admitting: Physician Assistant

## 2019-05-17 DIAGNOSIS — R7303 Prediabetes: Secondary | ICD-10-CM | POA: Diagnosis not present

## 2019-05-17 DIAGNOSIS — Z6841 Body Mass Index (BMI) 40.0 and over, adult: Secondary | ICD-10-CM

## 2019-05-19 NOTE — Progress Notes (Signed)
Office: [phone]  /  Fax: [phone] TeleHealth Visit:  Daniel [name] has verbally consented to this TeleHealth visit today. The patient is located at home, the provider is located at the [facility] office. The participants in this visit include the listed provider and patient. The visit was conducted today via Webex (25 minutes).  HPI:   Chief Complaint: OBESITY Daniel [name] is here to discuss his progress with his obesity treatment plan. He is on the Category 3 plan and is following his eating plan approximately 75% of the time. He states he is walking 30 minutes 2 times per week. Daniel [name] reports his most recent weight to be 280.4 lbs. He states he has had a good last few weeks. He is working from home at times and finding it easier to stay on the plan.  We were unable to weigh the patient today for this TeleHealth visit. He feels as if he has lost 5-6 lbs since his last visit. He has lost 22 lbs since starting treatment with us.  Pre-Diabetes Daniel [name] has a diagnosis of prediabetes based on his elevated Hgb A1c and was informed this puts him at greater risk of developing diabetes. He is on no medication currently and continues to work on diet and exercise to decrease risk of diabetes. He denies polyphagia.  ASSESSMENT AND PLAN:  No diagnosis found.  PLAN:  Pre-Diabetes Daniel [name] will continue to work on weight loss, exercise, and decreasing simple carbohydrates in his diet to help decrease the risk of diabetes. We dicussed metformin including benefits and risks. He was informed that eating too many simple carbohydrates or too many calories at one sitting increases the likelihood of GI side effects. Daniel [name] will continue with weight loss and follow-up with us as directed to monitor his progress.  Obesity Daniel [name] is currently in the action stage of change. As such, his goal is to continue with weight loss efforts. He has agreed to follow the Category 3 plan. Daniel [name] has been  instructed to work up to a goal of 150 minutes of combined cardio and strengthening exercise per week for weight loss and overall health benefits. We discussed the following Behavioral Modification Strategies today: work on meal planning and easy cooking plans.  Daniel [name] has agreed to follow-up with our clinic in 2 weeks. He was informed of the importance of frequent follow-up visits to maximize his success with intensive lifestyle modifications for his multiple health conditions.  ALLERGIES: Allergies  Allergen Reactions   Bee Venom Anaphylaxis   Shellfish Allergy Anaphylaxis    MEDICATIONS: Current Outpatient Medications on File Prior to Visit  Medication Sig Dispense Refill   albuterol (VENTOLIN HFA) 108 (90 Base) MCG/ACT inhaler INHALE 2 PUFFS INTO THE LUNGS EVERY 4 HOURS AS NEEDED FOR WHEEZING OR SHORTNESS OF BREATH. 18 g 5   buPROPion (WELLBUTRIN SR) 150 MG 12 hr tablet Take 1 tablet (150 mg total) by mouth daily. 30 tablet 0   chlorthalidone (HYGROTON) 25 MG tablet Take 1 tablet (25 mg total) by mouth daily. 30 tablet 1   diltiazem (CARDIZEM LA) 240 MG 24 hr tablet Take 1 tablet (240 mg total) by mouth daily. 30 tablet 1   EPINEPHrine (EPIPEN 2-PAK) 0.3 mg/0.3 mL SOAJ injection Inject 0.3 mLs (0.3 mg total) into the muscle once. 1 Device 11   etodolac (LODINE) 400 MG tablet Take 1 tablet (400 mg total) by mouth 2 (two) times daily as needed. 60 tablet 3   levocetirizine (XYZAL) 5 MG tablet Take  1 tablet (5 mg total) by mouth every evening. 90 tablet 3   montelukast (SINGULAIR) 10 MG tablet Take 1 tablet (10 mg total) by mouth at bedtime. 90 tablet 3   Multiple Vitamin (MULTIVITAMIN WITH MINERALS) TABS Take 1 tablet by mouth daily.     omeprazole (PRILOSEC) 20 MG capsule TAKE 1 CAPSULE BY MOUTH ONCE DAILY 90 capsule 3   polyethylene glycol powder (GLYCOLAX/MIRALAX) powder Take 17 g by mouth daily as needed. 3350 g 1   Polyvinyl Alcohol-Povidone (CLEAR EYES ALL SEASONS) 5-6  MG/ML SOLN Apply 1 drop to eye once as needed. Reported on 02/01/2016     No current facility-administered medications on file prior to visit.     PAST MEDICAL HISTORY: Past Medical History:  Diagnosis Date   Asthma    GERD (gastroesophageal reflux disease)    Hypertension     PAST SURGICAL HISTORY: History reviewed. No pertinent surgical history.  SOCIAL HISTORY: Social History   Tobacco Use   Smoking status: Never Smoker   Smokeless tobacco: Never Used  Substance Use Topics   Alcohol use: No    Alcohol/week: 0.0 standard drinks   Drug use: No    FAMILY HISTORY: Family History  Problem Relation Age of Onset   Hyperlipidemia Unknown    Hypertension Unknown    Stroke Unknown    Hypertension Mother    Hyperlipidemia Mother    Obesity Mother    Sudden death Father    ROS: Review of Systems  Endo/Heme/Allergies:       Negative for polyphagia.   PHYSICAL EXAM: Pt in no acute distress  RECENT LABS AND TESTS: BMET    Component Value Date/Time   NA 142 01/19/2019 1036   K 3.6 01/19/2019 1036   CL 97 01/19/2019 1036   CO2 26 01/19/2019 1036   GLUCOSE 80 01/19/2019 1036   GLUCOSE 103 (H) 11/07/2015 1454   BUN 19 01/19/2019 1036   CREATININE 1.22 01/19/2019 1036   CALCIUM 9.6 01/19/2019 1036   GFRNONAA 71 01/19/2019 1036   GFRAA 82 01/19/2019 1036   Lab Results  Component Value Date   HGBA1C 5.3 01/19/2019   HGBA1C 5.4 08/05/2018   HGBA1C 5.4 11/11/2017   HGBA1C 5.3 08/06/2017   HGBA1C 5.6 04/08/2017   Lab Results  Component Value Date   INSULIN 23.1 01/19/2019   INSULIN 21.7 08/05/2018   INSULIN 23.2 11/11/2017   INSULIN 27.6 (H) 08/06/2017   INSULIN 28.9 (H) 04/08/2017   CBC    Component Value Date/Time   WBC 6.6 12/24/2016 1021   WBC 10.2 11/07/2015 1454   RBC 5.47 12/24/2016 1021   RBC 5.67 11/07/2015 1454   HGB 15.2 12/24/2016 1021   HCT 44.3 12/24/2016 1021   PLT 300.0 11/07/2015 1454   MCV 81 12/24/2016 1021   MCH 27.8  12/24/2016 1021   MCH 28.0 07/18/2012 2030   MCHC 34.3 12/24/2016 1021   MCHC 33.3 11/07/2015 1454   RDW 14.6 12/24/2016 1021   LYMPHSABS 2.7 12/24/2016 1021   MONOABS 0.9 11/07/2015 1454   EOSABS 0.2 12/24/2016 1021   BASOSABS 0.1 12/24/2016 1021   Iron/TIBC/Ferritin/ %Sat No results found for: IRON, TIBC, FERRITIN, IRONPCTSAT Lipid Panel     Component Value Date/Time   CHOL 176 01/19/2019 1036   TRIG 92 01/19/2019 1036   HDL 37 (L) 01/19/2019 1036   CHOLHDL 5 03/17/2015 0841   VLDL 22.8 03/17/2015 0841   LDLCALC 121 (H) 01/19/2019 1036   Hepatic Function Panel  Component Value Date/Time   PROT 8.0 01/19/2019 1036   ALBUMIN 4.2 01/19/2019 1036   AST 30 01/19/2019 1036   ALT 35 01/19/2019 1036   ALKPHOS 60 01/19/2019 1036   BILITOT 0.7 01/19/2019 1036   BILIDIR 0.1 11/07/2015 1454      Component Value Date/Time   TSH 2.27 09/02/2018 0913   TSH 1.370 12/24/2016 1021   TSH 1.39 03/17/2015 0841   Results for DAYTEN, JUBA (MRN [id number]) as of 05/19/2019 16:28  Ref. Range 01/19/2019 10:36  Vitamin D, 25-Hydroxy Latest Ref Range: 30.0 - 100.0 ng/mL 58.1    I, Michaelene Song, am acting as Location manager for [employer], PA-C I, Abby Potash, PA-C have reviewed above note and agree with its content

## 2019-06-01 ENCOUNTER — Encounter (INDEPENDENT_AMBULATORY_CARE_PROVIDER_SITE_OTHER): Payer: Self-pay | Admitting: Physician Assistant

## 2019-06-01 ENCOUNTER — Other Ambulatory Visit: Payer: Self-pay

## 2019-06-01 ENCOUNTER — Ambulatory Visit (INDEPENDENT_AMBULATORY_CARE_PROVIDER_SITE_OTHER): Payer: 59 | Admitting: Physician Assistant

## 2019-06-01 DIAGNOSIS — Z6841 Body Mass Index (BMI) 40.0 and over, adult: Secondary | ICD-10-CM

## 2019-06-01 DIAGNOSIS — F3289 Other specified depressive episodes: Secondary | ICD-10-CM

## 2019-06-01 DIAGNOSIS — E66813 Obesity, class 3: Secondary | ICD-10-CM

## 2019-06-01 DIAGNOSIS — I10 Essential (primary) hypertension: Secondary | ICD-10-CM

## 2019-06-02 NOTE — Progress Notes (Signed)
Office: [phone]  /  Fax: [phone] TeleHealth Visit:  Daniel [name] has verbally consented to this TeleHealth visit today. The patient is located at home, the provider is located at the [facility] office. The participants in this visit include the listed provider and patient. The visit was conducted today via Webex.  HPI:   Chief Complaint: OBESITY Daniel [name] is here to discuss his progress with his obesity treatment plan. He is [name] the Category 3 plan and is following his eating plan approximately 60% of the time. He states he is doing cardio 60 minutes 3 days a week. Daniel [name] reports his most recent weight to be 281 lbs. He reports not eating as much while working from home. He is trying to create a new routine. We were unable to weigh the patient today for this TeleHealth visit. He feels as if he has maintained his weight since his last visit. He has lost 22 lbs since starting treatment with us.  Hypertension Daniel [name] is a 48 y.o. male with hypertension and is [name] Cardizem and chlorthalidone.  Daniel [name] denies chest pain or headache. He is working weight loss to help control his blood pressure with the goal of decreasing his risk of heart attack and stroke.   Depression with emotional eating behaviors Daniel [name] is struggling with emotional eating and using food for comfort to the extent that it is negatively impacting his health. He often snacks when he is not hungry. Daniel [name] sometimes feels he is out of control and then feels guilty that he made poor food choices. He has been working [name] behavior modification techniques to help reduce his emotional eating and has been somewhat successful. Daniel [name] reports no cravings. He shows no sign of suicidal or homicidal ideations.  [facility] 12/24/2016  Decreased Interest 0 0  Down, Depressed, Hopeless 0 0  PHQ - 2 Score 0 0  Altered sleeping - 0  Tired, decreased energy - 1  Change in appetite - 0    Feeling bad or failure about yourself  - 0  Trouble concentrating - 1  Moving slowly or fidgety/restless - 0  Suicidal thoughts - 0  PHQ-9 Score - 2   ASSESSMENT AND PLAN:  Essential hypertension  Other depression  Class 3 severe obesity with serious comorbidity and body mass index (BMI) of 40.0 to 44.9 in adult, unspecified obesity type (HCC)  PLAN:  Hypertension We discussed sodium restriction, working [name] healthy weight loss, and a regular exercise program as the means to achieve improved blood pressure control. Author agreed with this plan and agreed to follow up as directed. We will continue to monitor his blood pressure as well as his progress with the above lifestyle modifications. Daniel [name] was given a refill [name] his Cardizem #30 with 1 refill and a refill [name] his chlorthalidone #30 with 0 refills. He agrees to follow-up with our clinic in 2 weeks and will watch for signs of hypotension as he continues his lifestyle modifications.  Depression with Emotional Eating Behaviors We discussed behavior modification techniques today to help Daniel [name] deal with his emotional eating and depression. Daniel [name] was given a refill [name] his Wellbutrin #30 with 0 refills and agrees to follow-up with our clinic in 2 weeks.  Obesity Daniel [name] is currently in the action stage of change. As such, his goal is to continue with weight loss efforts. He has agreed to follow the Category 3 plan. Daniel [name] has been instructed to work up to  a goal of 150 minutes of combined cardio and strengthening exercise per week for weight loss and overall health benefits. We discussed the following Behavioral Modification Strategies today: no skipping meals, work [name] meal planning and easy cooking plans.  Bron has agreed to follow-up with our clinic in 2 weeks. He was informed of the importance of frequent follow-up visits to maximize his success with intensive lifestyle modifications for his multiple health  conditions.  ALLERGIES: Allergies  Allergen Reactions   Bee Venom Anaphylaxis   Shellfish Allergy Anaphylaxis    MEDICATIONS: Current Outpatient Medications [name] File Prior to Visit  Medication Sig Dispense Refill   albuterol (VENTOLIN HFA) 108 (90 Base) MCG/ACT inhaler INHALE 2 PUFFS INTO THE LUNGS EVERY 4 HOURS AS NEEDED FOR WHEEZING OR SHORTNESS OF BREATH. 18 g 5   buPROPion (WELLBUTRIN SR) 150 MG 12 hr tablet Take 1 tablet (150 mg total) by mouth daily. 30 tablet 0   chlorthalidone (HYGROTON) 25 MG tablet Take 1 tablet (25 mg total) by mouth daily. 30 tablet 1   diltiazem (CARDIZEM LA) 240 MG 24 hr tablet Take 1 tablet (240 mg total) by mouth daily. 30 tablet 1   EPINEPHrine (EPIPEN 2-PAK) 0.3 mg/0.3 mL SOAJ injection Inject 0.3 mLs (0.3 mg total) into the muscle once. 1 Device 11   etodolac (LODINE) 400 MG tablet Take 1 tablet (400 mg total) by mouth 2 (two) times daily as needed. 60 tablet 3   levocetirizine (XYZAL) 5 MG tablet Take 1 tablet (5 mg total) by mouth every evening. 90 tablet 3   montelukast (SINGULAIR) 10 MG tablet Take 1 tablet (10 mg total) by mouth at bedtime. 90 tablet 3   Multiple Vitamin (MULTIVITAMIN WITH MINERALS) TABS Take 1 tablet by mouth daily.     omeprazole (PRILOSEC) 20 MG capsule TAKE 1 CAPSULE BY MOUTH ONCE DAILY 90 capsule 3   polyethylene glycol powder (GLYCOLAX/MIRALAX) powder Take 17 g by mouth daily as needed. 3350 g 1   Polyvinyl Alcohol-Povidone (CLEAR EYES ALL SEASONS) 5-6 MG/ML SOLN Apply 1 drop to eye once as needed. Reported [name] 02/01/2016     No current facility-administered medications [name] file prior to visit.     PAST MEDICAL HISTORY: Past Medical History:  Diagnosis Date   Asthma    GERD (gastroesophageal reflux disease)    Hypertension     PAST SURGICAL HISTORY: History reviewed. No pertinent surgical history.  SOCIAL HISTORY: Social History   Tobacco Use   Smoking status: Never Smoker   Smokeless tobacco:  Never Used  Substance Use Topics   Alcohol use: No    Alcohol/week: 0.0 standard drinks   Drug use: No    FAMILY HISTORY: Family History  Problem Relation Age of Onset   Hyperlipidemia Unknown    Hypertension Unknown    Stroke Unknown    Hypertension Mother    Hyperlipidemia Mother    Obesity Mother    Sudden death Father    ROS: Review of Systems  Cardiovascular: Negative for chest pain.  Neurological: Negative for headaches.  Psychiatric/Behavioral: Positive for depression (emotional eating). Negative for suicidal ideas.       Negative for homicidal ideas.   PHYSICAL EXAM: Pt in no acute distress  RECENT LABS AND TESTS: BMET    Component Value Date/Time   NA 142 01/19/2019 1036   K 3.6 01/19/2019 1036   CL 97 01/19/2019 1036   CO2 26 01/19/2019 1036   GLUCOSE 80 01/19/2019 1036   GLUCOSE 103 (H)  11/07/2015 1454   BUN 19 01/19/2019 1036   CREATININE 1.22 01/19/2019 1036   CALCIUM 9.6 01/19/2019 1036   GFRNONAA 71 01/19/2019 1036   GFRAA 82 01/19/2019 1036   Lab Results  Component Value Date   HGBA1C 5.3 01/19/2019   HGBA1C 5.4 08/05/2018   HGBA1C 5.4 11/11/2017   HGBA1C 5.3 08/06/2017   HGBA1C 5.6 04/08/2017   Lab Results  Component Value Date   INSULIN 23.1 01/19/2019   INSULIN 21.7 08/05/2018   INSULIN 23.2 11/11/2017   INSULIN 27.6 (H) 08/06/2017   INSULIN 28.9 (H) 04/08/2017   CBC    Component Value Date/Time   WBC 6.6 12/24/2016 1021   WBC 10.2 11/07/2015 1454   RBC 5.47 12/24/2016 1021   RBC 5.67 11/07/2015 1454   HGB 15.2 12/24/2016 1021   HCT 44.3 12/24/2016 1021   PLT 300.0 11/07/2015 1454   MCV 81 12/24/2016 1021   MCH 27.8 12/24/2016 1021   MCH 28.0 07/18/2012 2030   MCHC 34.3 12/24/2016 1021   MCHC 33.3 11/07/2015 1454   RDW 14.6 12/24/2016 1021   LYMPHSABS 2.7 12/24/2016 1021   MONOABS 0.9 11/07/2015 1454   EOSABS 0.2 12/24/2016 1021   BASOSABS 0.1 12/24/2016 1021   Iron/TIBC/Ferritin/ %Sat No results found for:  IRON, TIBC, FERRITIN, IRONPCTSAT Lipid Panel     Component Value Date/Time   CHOL 176 01/19/2019 1036   TRIG 92 01/19/2019 1036   HDL 37 (L) 01/19/2019 1036   CHOLHDL 5 03/17/2015 0841   VLDL 22.8 03/17/2015 0841   LDLCALC 121 (H) 01/19/2019 1036   Hepatic Function Panel     Component Value Date/Time   PROT 8.0 01/19/2019 1036   ALBUMIN 4.2 01/19/2019 1036   AST 30 01/19/2019 1036   ALT 35 01/19/2019 1036   ALKPHOS 60 01/19/2019 1036   BILITOT 0.7 01/19/2019 1036   BILIDIR 0.1 11/07/2015 1454      Component Value Date/Time   TSH 2.27 09/02/2018 0913   TSH 1.370 12/24/2016 1021   TSH 1.39 03/17/2015 0841   Results for Daniel BondROOKS, Joncarlo M (MRN [id number]) as of 06/02/2019 09:15  Ref. Range 01/19/2019 10:36  Vitamin D, 25-Hydroxy Latest Ref Range: 30.0 - 100.0 ng/mL 58.1   I, Marianna Paymentenise Haag, am acting as Energy managertranscriptionist for [employer] Aguilar, PA-C I, Alois Clicheracey Aguilar, PA-C have reviewed above note and agree with its content

## 2019-06-14 ENCOUNTER — Other Ambulatory Visit: Payer: Self-pay

## 2019-06-14 ENCOUNTER — Encounter (INDEPENDENT_AMBULATORY_CARE_PROVIDER_SITE_OTHER): Payer: Self-pay | Admitting: Physician Assistant

## 2019-06-14 ENCOUNTER — Telehealth (INDEPENDENT_AMBULATORY_CARE_PROVIDER_SITE_OTHER): Payer: 59 | Admitting: Physician Assistant

## 2019-06-14 DIAGNOSIS — F3289 Other specified depressive episodes: Secondary | ICD-10-CM | POA: Diagnosis not present

## 2019-06-14 DIAGNOSIS — I10 Essential (primary) hypertension: Secondary | ICD-10-CM

## 2019-06-14 DIAGNOSIS — Z6841 Body Mass Index (BMI) 40.0 and over, adult: Secondary | ICD-10-CM | POA: Diagnosis not present

## 2019-06-14 MED ORDER — DILTIAZEM HCL ER COATED BEADS 240 MG PO TB24: 240.0000 mg | ORAL_TABLET | Freq: Every day | ORAL | 1 refills | Status: DC

## 2019-06-14 MED ORDER — CHLORTHALIDONE 25 MG PO TABS: 25.0000 mg | ORAL_TABLET | Freq: Every day | ORAL | 1 refills | Status: DC

## 2019-06-14 MED ORDER — BUPROPION HCL ER (SR) 150 MG PO TB12: 150.0000 mg | ORAL_TABLET | Freq: Every day | ORAL | 0 refills | Status: DC

## 2019-06-14 MED FILL — CHLORTHALIDONE 25 MG TABS: 25 | 30 days supply | Qty: 30 | Fill #0

## 2019-06-14 MED FILL — DILTIAZEM ER 240 MG TABLET: 240 | 30 days supply | Qty: 30 | Fill #0

## 2019-06-15 NOTE — Progress Notes (Signed)
Office: [phone]  /  Fax: [phone] TeleHealth Visit:  Daniel [name] has verbally consented to this TeleHealth visit today. The patient is located at home, the provider is located at the [facility] office. The participants in this visit include the listed provider and patient. The visit was conducted today via Webex.  HPI:   Chief Complaint: OBESITY Daniel [name] is here to discuss his progress with his obesity treatment plan. He is on the Category 3 plan and is following his eating plan approximately 65% of the time. He states he is walking 9 miles a week.  Daniel [name] reports that he has had a hard time with the plan since working from home. He is eating a late breakfast, which pushes his lunch and dinner time back. We were unable to weigh the patient today for this TeleHealth visit. He feels as if he has maintained his weight since his last visit. He has lost 22 lbs since starting treatment with us.  Hypertension Daniel [name] is a 48 y.o. male with hypertension and is on chlorthalidone and Cardizem.  Daniel [name] denies chest pain or headache. He is working weight loss to help control his blood pressure with the goal of decreasing his risk of heart attack and stroke.   Depression with emotional eating behaviors Daniel [name] is struggling with emotional eating and using food for comfort to the extent that it is negatively impacting his health. He often snacks when he is not hungry. Daniel [name] sometimes feels he is out of control and then feels guilty that he made poor food choices. He has been working on behavior modification techniques to help reduce his emotional eating and has been somewhat successful. Daniel [name] reports no cravings. He shows no sign of suicidal or homicidal ideations.  [facility] 12/24/2016  Decreased Interest 0 0  Down, Depressed, Hopeless 0 0  PHQ - 2 Score 0 0  Altered sleeping - 0  Tired, decreased energy - 1  Change in appetite - 0    Feeling bad or failure about yourself  - 0  Trouble concentrating - 1  Moving slowly or fidgety/restless - 0  Suicidal thoughts - 0  PHQ-9 Score - 2   ASSESSMENT AND PLAN:  Essential hypertension - Plan: diltiazem (CARDIZEM LA) 240 MG 24 hr tablet, chlorthalidone (HYGROTON) 25 MG tablet  Other depression - Plan: buPROPion (WELLBUTRIN SR) 150 MG 12 hr tablet  Class 3 severe obesity with serious comorbidity and body mass index (BMI) of 40.0 to 44.9 in adult, unspecified obesity type (HCC)  Other depression - with emotional eating  - Plan: buPROPion (WELLBUTRIN SR) 150 MG 12 hr tablet  PLAN:  Hypertension We discussed sodium restriction, working on healthy weight loss, and a regular exercise program as the means to achieve improved blood pressure control. Daniel [name] agreed with this plan and agreed to follow up as directed. We will continue to monitor his blood pressure as well as his progress with the above lifestyle modifications. Daniel [name] was given a refill on his chlorthalidone #30 with 1 refill and a refill on his Cardizem #30 with 1 refill and he agrees to follow-up with our clinic in 2 weeks. He will watch for signs of hypotension as he continues his lifestyle modifications.  Depression with Emotional Eating Behaviors We discussed behavior modification techniques today to help Daniel [name] deal with his emotional eating and depression. Daniel [name] was given a refill on his Wellbutrin #30 with 0 refills and agrees to follow-up with  our clinic in 2 weeks.  Obesity Daniel [name] is currently in the action stage of change. As such, his goal is to continue with weight loss efforts. He has agreed to keep a food journal with 1500 calories and 95 grams of protein daily. Daniel [name] has been instructed to work up to a goal of 150 minutes of combined cardio and strengthening exercise per week for weight loss and overall health benefits. We discussed the following Behavioral Modification Strategies today: no skipping  meals, work on meal planning and easy cooking plans.  Daniel [name] has agreed to follow-up with our clinic in 2 weeks. He was informed of the importance of frequent follow-up visits to maximize his success with intensive lifestyle modifications for his multiple health conditions.  ALLERGIES: Allergies  Allergen Reactions   Bee Venom Anaphylaxis   Shellfish Allergy Anaphylaxis    MEDICATIONS: Current Outpatient Medications on File Prior to Visit  Medication Sig Dispense Refill   albuterol (VENTOLIN HFA) 108 (90 Base) MCG/ACT inhaler INHALE 2 PUFFS INTO THE LUNGS EVERY 4 HOURS AS NEEDED FOR WHEEZING OR SHORTNESS OF BREATH. 18 g 5   EPINEPHrine (EPIPEN 2-PAK) 0.3 mg/0.3 mL SOAJ injection Inject 0.3 mLs (0.3 mg total) into the muscle once. 1 Device 11   etodolac (LODINE) 400 MG tablet Take 1 tablet (400 mg total) by mouth 2 (two) times daily as needed. 60 tablet 3   levocetirizine (XYZAL) 5 MG tablet Take 1 tablet (5 mg total) by mouth every evening. 90 tablet 3   montelukast (SINGULAIR) 10 MG tablet Take 1 tablet (10 mg total) by mouth at bedtime. 90 tablet 3   Multiple Vitamin (MULTIVITAMIN WITH MINERALS) TABS Take 1 tablet by mouth daily.     omeprazole (PRILOSEC) 20 MG capsule TAKE 1 CAPSULE BY MOUTH ONCE DAILY 90 capsule 3   polyethylene glycol powder (GLYCOLAX/MIRALAX) powder Take 17 g by mouth daily as needed. 3350 g 1   Polyvinyl Alcohol-Povidone (CLEAR EYES ALL SEASONS) 5-6 MG/ML SOLN Apply 1 drop to eye once as needed. Reported on 02/01/2016     No current facility-administered medications on file prior to visit.     PAST MEDICAL HISTORY: Past Medical History:  Diagnosis Date   Asthma    GERD (gastroesophageal reflux disease)    Hypertension     PAST SURGICAL HISTORY: History reviewed. No pertinent surgical history.  SOCIAL HISTORY: Social History   Tobacco Use   Smoking status: Never Smoker   Smokeless tobacco: Never Used  Substance Use Topics   Alcohol  use: No    Alcohol/week: 0.0 standard drinks   Drug use: No    FAMILY HISTORY: Family History  Problem Relation Age of Onset   Hyperlipidemia Unknown    Hypertension Unknown    Stroke Unknown    Hypertension Mother    Hyperlipidemia Mother    Obesity Mother    Sudden death Father    ROS: Review of Systems  Cardiovascular: Negative for chest pain.  Neurological: Negative for headaches.  Psychiatric/Behavioral: Positive for depression (emotional eating). Negative for suicidal ideas.       Negative for homicidal ideas.   PHYSICAL EXAM: Pt in no acute distress  RECENT LABS AND TESTS: BMET    Component Value Date/Time   NA 142 01/19/2019 1036   K 3.6 01/19/2019 1036   CL 97 01/19/2019 1036   CO2 26 01/19/2019 1036   GLUCOSE 80 01/19/2019 1036   GLUCOSE 103 (H) 11/07/2015 1454   BUN 19 01/19/2019 1036   CREATININE  1.22 01/19/2019 1036   CALCIUM 9.6 01/19/2019 1036   GFRNONAA 71 01/19/2019 1036   GFRAA 82 01/19/2019 1036   Lab Results  Component Value Date   HGBA1C 5.3 01/19/2019   HGBA1C 5.4 08/05/2018   HGBA1C 5.4 11/11/2017   HGBA1C 5.3 08/06/2017   HGBA1C 5.6 04/08/2017   Lab Results  Component Value Date   INSULIN 23.1 01/19/2019   INSULIN 21.7 08/05/2018   INSULIN 23.2 11/11/2017   INSULIN 27.6 (H) 08/06/2017   INSULIN 28.9 (H) 04/08/2017   CBC    Component Value Date/Time   WBC 6.6 12/24/2016 1021   WBC 10.2 11/07/2015 1454   RBC 5.47 12/24/2016 1021   RBC 5.67 11/07/2015 1454   HGB 15.2 12/24/2016 1021   HCT 44.3 12/24/2016 1021   PLT 300.0 11/07/2015 1454   MCV 81 12/24/2016 1021   MCH 27.8 12/24/2016 1021   MCH 28.0 07/18/2012 2030   MCHC 34.3 12/24/2016 1021   MCHC 33.3 11/07/2015 1454   RDW 14.6 12/24/2016 1021   LYMPHSABS 2.7 12/24/2016 1021   MONOABS 0.9 11/07/2015 1454   EOSABS 0.2 12/24/2016 1021   BASOSABS 0.1 12/24/2016 1021   Iron/TIBC/Ferritin/ %Sat No results found for: IRON, TIBC, FERRITIN, IRONPCTSAT Lipid Panel       Component Value Date/Time   CHOL 176 01/19/2019 1036   TRIG 92 01/19/2019 1036   HDL 37 (L) 01/19/2019 1036   CHOLHDL 5 03/17/2015 0841   VLDL 22.8 03/17/2015 0841   LDLCALC 121 (H) 01/19/2019 1036   Hepatic Function Panel     Component Value Date/Time   PROT 8.0 01/19/2019 1036   ALBUMIN 4.2 01/19/2019 1036   AST 30 01/19/2019 1036   ALT 35 01/19/2019 1036   ALKPHOS 60 01/19/2019 1036   BILITOT 0.7 01/19/2019 1036   BILIDIR 0.1 11/07/2015 1454      Component Value Date/Time   TSH 2.27 09/02/2018 0913   TSH 1.370 12/24/2016 1021   TSH 1.39 03/17/2015 0841   Results for Daniel BondROOKS, Corgan M (MRN [id number]) as of 06/15/2019 16:10  Ref. Range 01/19/2019 10:36  Vitamin D, 25-Hydroxy Latest Ref Range: 30.0 - 100.0 ng/mL 58.1   I, Marianna Paymentenise Haag, am acting as Energy managertranscriptionist for [employer] Agu[employer], PA-C I, Alois Clicheracey Aguilar, PA-C have reviewed above note and agree with its content

## 2019-06-22 MED FILL — BUPROPION HCL SR 150 MG TAB: 150 | 30 days supply | Qty: 30 | Fill #0

## 2019-07-05 ENCOUNTER — Encounter (INDEPENDENT_AMBULATORY_CARE_PROVIDER_SITE_OTHER): Payer: Self-pay | Admitting: Physician Assistant

## 2019-07-05 ENCOUNTER — Other Ambulatory Visit: Payer: Self-pay

## 2019-07-05 ENCOUNTER — Telehealth (INDEPENDENT_AMBULATORY_CARE_PROVIDER_SITE_OTHER): Payer: 59 | Admitting: Physician Assistant

## 2019-07-05 DIAGNOSIS — Z6841 Body Mass Index (BMI) 40.0 and over, adult: Secondary | ICD-10-CM | POA: Diagnosis not present

## 2019-07-05 DIAGNOSIS — R7303 Prediabetes: Secondary | ICD-10-CM

## 2019-07-06 NOTE — Progress Notes (Signed)
Office: [phone]  /  Fax: [phone] TeleHealth Visit:  [name] has verbally consented to this TeleHealth visit today. The patient is located at home, the provider is located at the [facility] office. The participants in this visit include the listed provider and patient. The visit was conducted today via Webex (25 minutes).  HPI:   Chief Complaint: OBESITY [name] is here to discuss his progress with his obesity treatment plan. He is on the Category 3 plan and is following his eating plan approximately 75-80% of the time. He states he is walking 3 miles 3 times per week. [name] reports his most recent weight to be 278 lbs. He started journaling and is really enjoying it. It is keeping him accountable for his food intake.  We were unable to weigh the patient today for this TeleHealth visit. He feels as if he has lost 6 lbs since his last visit. He has lost 22 lbs since starting treatment with us.  Pre-Diabetes [name] has a diagnosis of prediabetes based on his elevated Hgb A1c and was informed this puts him at greater risk of developing diabetes. He is on no medications currently and continues to work on diet and exercise to decrease risk of diabetes.  ASSESSMENT AND PLAN:  Prediabetes  Class 3 severe obesity with serious comorbidity and body mass index (BMI) of 40.0 to 44.9 in adult, unspecified obesity type [facility](HCC)  PLAN:  Pre-Diabetes [name] will continue to work on weight loss, exercise, and decreasing simple carbohydrates in his diet to help decrease the risk of diabetes. We dicussed metformin including benefits and risks. He was informed that eating too many simple carbohydrates or too many calories at one sitting increases the likelihood of GI side effects. [name] will continue with weight loss and follow-up with us as directed to monitor his progress.  Obesity [name] is currently in the action stage of change. As such, his goal is to continue with  weight loss efforts. He has agreed to keep a food journal with 1500 calories and 95 grams of protein daily. [name] has been instructed to work up to a goal of 150 minutes of combined cardio and strengthening exercise per week for weight loss and overall health benefits. We discussed the following Behavioral Modification Stratagies today: work on meal planning and easy cooking plans, keeping healthy foods in the home, planning for success, and keep a strict food journal.  [name] has agreed to follow-up with our clinic in 2 weeks. He was informed of the importance of frequent follow-up visits to maximize his success with intensive lifestyle modifications for his multiple health conditions.  I spent > than 50% of the 25 minute visit on counseling as documented in the note.    ALLERGIES: Allergies  Allergen Reactions   Bee Venom Anaphylaxis   Shellfish Allergy Anaphylaxis    MEDICATIONS: Current Outpatient Medications on File Prior to Visit  Medication Sig Dispense Refill   albuterol (VENTOLIN HFA) 108 (90 Base) MCG/ACT inhaler INHALE 2 PUFFS INTO THE LUNGS EVERY 4 HOURS AS NEEDED FOR WHEEZING OR SHORTNESS OF BREATH. 18 g 5   buPROPion (WELLBUTRIN SR) 150 MG 12 hr tablet Take 1 tablet (150 mg total) by mouth daily. 30 tablet 0   chlorthalidone (HYGROTON) 25 MG tablet Take 1 tablet (25 mg total) by mouth daily. 30 tablet 1   diltiazem (CARDIZEM LA) 240 MG 24 hr tablet Take 1 tablet (240 mg total) by mouth daily. 30 tablet 1   EPINEPHrine (  EPIPEN 2-PAK) 0.3 mg/0.3 mL SOAJ injection Inject 0.3 mLs (0.3 mg total) into the muscle once. 1 Device 11   etodolac (LODINE) 400 MG tablet Take 1 tablet (400 mg total) by mouth 2 (two) times daily as needed. 60 tablet 3   levocetirizine (XYZAL) 5 MG tablet Take 1 tablet (5 mg total) by mouth every evening. 90 tablet 3   montelukast (SINGULAIR) 10 MG tablet Take 1 tablet (10 mg total) by mouth at bedtime. 90 tablet 3   Multiple Vitamin  (MULTIVITAMIN WITH MINERALS) TABS Take 1 tablet by mouth daily.     omeprazole (PRILOSEC) 20 MG capsule TAKE 1 CAPSULE BY MOUTH ONCE DAILY 90 capsule 3   polyethylene glycol powder (GLYCOLAX/MIRALAX) powder Take 17 g by mouth daily as needed. 3350 g 1   Polyvinyl Alcohol-Povidone (CLEAR EYES ALL SEASONS) 5-6 MG/ML SOLN Apply 1 drop to eye once as needed. Reported on 02/01/2016     No current facility-administered medications on file prior to visit.     PAST MEDICAL HISTORY: Past Medical History:  Diagnosis Date   Asthma    GERD (gastroesophageal reflux disease)    Hypertension     PAST SURGICAL HISTORY: History reviewed. No pertinent surgical history.  SOCIAL HISTORY: Social History   Tobacco Use   Smoking status: Never Smoker   Smokeless tobacco: Never Used  Substance Use Topics   Alcohol use: No    Alcohol/week: 0.0 standard drinks   Drug use: No    FAMILY HISTORY: Family History  Problem Relation Age of Onset   Hyperlipidemia Unknown    Hypertension Unknown    Stroke Unknown    Hypertension Mother    Hyperlipidemia Mother    Obesity Mother    Sudden death Father    ROS: ROS none noted.  PHYSICAL EXAM: Pt in no acute distress  RECENT LABS AND TESTS: BMET    Component Value Date/Time   NA 142 01/19/2019 1036   K 3.6 01/19/2019 1036   CL 97 01/19/2019 1036   CO2 26 01/19/2019 1036   GLUCOSE 80 01/19/2019 1036   GLUCOSE 103 (H) 11/07/2015 1454   BUN 19 01/19/2019 1036   CREATININE 1.22 01/19/2019 1036   CALCIUM 9.6 01/19/2019 1036   GFRNONAA 71 01/19/2019 1036   GFRAA 82 01/19/2019 1036   Lab Results  Component Value Date   HGBA1C 5.3 01/19/2019   HGBA1C 5.4 08/05/2018   HGBA1C 5.4 11/11/2017   HGBA1C 5.3 08/06/2017   HGBA1C 5.6 04/08/2017   Lab Results  Component Value Date   INSULIN 23.1 01/19/2019   INSULIN 21.7 08/05/2018   INSULIN 23.2 11/11/2017   INSULIN 27.6 (H) 08/06/2017   INSULIN 28.9 (H) 04/08/2017   CBC      Component Value Date/Time   WBC 6.6 12/24/2016 1021   WBC 10.2 11/07/2015 1454   RBC 5.47 12/24/2016 1021   RBC 5.67 11/07/2015 1454   HGB 15.2 12/24/2016 1021   HCT 44.3 12/24/2016 1021   PLT 300.0 11/07/2015 1454   MCV 81 12/24/2016 1021   MCH 27.8 12/24/2016 1021   MCH 28.0 07/18/2012 2030   MCHC 34.3 12/24/2016 1021   MCHC 33.3 11/07/2015 1454   RDW 14.6 12/24/2016 1021   LYMPHSABS 2.7 12/24/2016 1021   MONOABS 0.9 11/07/2015 1454   EOSABS 0.2 12/24/2016 1021   BASOSABS 0.1 12/24/2016 1021   Iron/TIBC/Ferritin/ %Sat No results found for: IRON, TIBC, FERRITIN, IRONPCTSAT Lipid Panel     Component Value Date/Time   CHOL  176 01/19/2019 1036   TRIG 92 01/19/2019 1036   HDL 37 (L) 01/19/2019 1036   CHOLHDL 5 03/17/2015 0841   VLDL 22.8 03/17/2015 0841   LDLCALC 121 (H) 01/19/2019 1036   Hepatic Function Panel     Component Value Date/Time   PROT 8.0 01/19/2019 1036   ALBUMIN 4.2 01/19/2019 1036   AST 30 01/19/2019 1036   ALT 35 01/19/2019 1036   ALKPHOS 60 01/19/2019 1036   BILITOT 0.7 01/19/2019 1036   BILIDIR 0.1 11/07/2015 1454      Component Value Date/Time   TSH 2.27 09/02/2018 0913   TSH 1.370 12/24/2016 1021   TSH 1.39 03/17/2015 0841   Results for KLINT, LEZCANO (MRN [id number]) as of 07/06/2019 08:34  Ref. Range 01/19/2019 10:36  Vitamin D, 25-Hydroxy Latest Ref Range: 30.0 - 100.0 ng/mL 58.1   I, Michaelene Song, am acting as Location manager for [employer], PA-C I, Abby Potash, PA-C have reviewed above note and agree with its content

## 2019-07-19 ENCOUNTER — Telehealth (INDEPENDENT_AMBULATORY_CARE_PROVIDER_SITE_OTHER): Payer: 59 | Admitting: Physician Assistant

## 2019-07-19 ENCOUNTER — Other Ambulatory Visit: Payer: Self-pay

## 2019-07-19 ENCOUNTER — Encounter (INDEPENDENT_AMBULATORY_CARE_PROVIDER_SITE_OTHER): Payer: Self-pay | Admitting: Physician Assistant

## 2019-07-19 DIAGNOSIS — E8881 Metabolic syndrome: Secondary | ICD-10-CM

## 2019-07-19 DIAGNOSIS — Z6841 Body Mass Index (BMI) 40.0 and over, adult: Secondary | ICD-10-CM | POA: Diagnosis not present

## 2019-07-19 NOTE — Progress Notes (Signed)
Office: [phone]  /  Fax: [phone] TeleHealth Visit:  Daniel [name] has verbally consented to this TeleHealth visit today. The patient is located at home, the provider is located at the [facility]. The participants in this visit include the listed provider and patient. The visit was conducted today via Webex.  HPI:   Chief Complaint: OBESITY Daniel [name] is here to discuss his progress with his obesity treatment plan. He is on the Category 3 plan and is following his eating plan approximately 60% of the time. He states he is walking 30 minutes daily. Daniel [name] reports that he has been working out in the field more often recently and has a good routine during the day. Dinner meals are his challenge as he is not getting in all of his food at dinner. He tends not to journal during the weekend. We were unable to weigh the patient today for this TeleHealth visit. He feels as if he has gained 2 lbs since his last visit. He has lost 22 lbs since starting treatment with [name].  Insulin Resistance Daniel [name] has a diagnosis of insulin resistance based on his elevated fasting insulin level >5. Although Daniel [name]'s blood glucose readings are still under good control, insulin resistance puts him at greater risk of metabolic syndrome and diabetes. He is on no medication currently and continues to work on diet and exercise to decrease risk of diabetes. No polyphagia.  ASSESSMENT AND PLAN:  Insulin resistance  Class 3 severe obesity with serious comorbidity and body mass index (BMI) of 40.0 to 44.9 in adult, unspecified obesity type (Daniel [name])  PLAN:  Insulin Resistance Daniel [name] will continue to work on weight loss, exercise, and decreasing simple carbohydrates in his diet to help decrease the risk of diabetes. We dicussed metformin including benefits and risks. He was informed that eating too many simple carbohydrates or too many calories at one sitting increases the likelihood of GI side effects.  Daniel [name] will continue with weight loss and follow-up with [name] as directed to monitor his progress.  Obesity Daniel [name] is currently in the action stage of change. As such, his goal is to continue with weight loss efforts. He has agreed to follow the Category 3 plan. Daniel [name] has been instructed to work up to a goal of 150 minutes of combined cardio and strengthening exercise per week for weight loss and overall health benefits. We discussed the following Behavioral Modification Strategies today: increasing lean protein intake and no skipping meals.  Daniel [name] has agreed to follow-up with our clinic in 2 weeks. He was informed of the importance of frequent follow-up visits to maximize his success with intensive lifestyle modifications for his multiple health conditions.  I spent > than 50% of the 25 minute visit on counseling as documented in the note.    ALLERGIES: Allergies  Allergen Reactions   Bee Venom Anaphylaxis   Shellfish Allergy Anaphylaxis    MEDICATIONS: Current Outpatient Medications on File Prior to Visit  Medication Sig Dispense Refill   albuterol (VENTOLIN HFA) 108 (90 Base) MCG/ACT inhaler INHALE 2 PUFFS INTO THE LUNGS EVERY 4 HOURS AS NEEDED FOR WHEEZING OR SHORTNESS OF BREATH. 18 g 5   buPROPion (WELLBUTRIN SR) 150 MG 12 hr tablet Take 1 tablet (150 mg total) by mouth daily. 30 tablet 0   chlorthalidone (HYGROTON) 25 MG tablet Take 1 tablet (25 mg total) by mouth daily. 30 tablet 1   diltiazem (CARDIZEM LA) 240 MG 24 hr tablet Take 1 tablet (240 mg total)  by mouth daily. 30 tablet 1   EPINEPHrine (EPIPEN 2-PAK) 0.3 mg/0.3 mL SOAJ injection Inject 0.3 mLs (0.3 mg total) into the muscle once. 1 Device 11   etodolac (LODINE) 400 MG tablet Take 1 tablet (400 mg total) by mouth 2 (two) times daily as needed. 60 tablet 3   levocetirizine (XYZAL) 5 MG tablet Take 1 tablet (5 mg total) by mouth every evening. 90 tablet 3   montelukast (SINGULAIR) 10 MG tablet Take 1 tablet  (10 mg total) by mouth at bedtime. 90 tablet 3   Multiple Vitamin (MULTIVITAMIN WITH MINERALS) TABS Take 1 tablet by mouth daily.     omeprazole (PRILOSEC) 20 MG capsule TAKE 1 CAPSULE BY MOUTH ONCE DAILY 90 capsule 3   polyethylene glycol powder (GLYCOLAX/MIRALAX) powder Take 17 g by mouth daily as needed. 3350 g 1   Polyvinyl Alcohol-Povidone (CLEAR EYES ALL SEASONS) 5-6 MG/ML SOLN Apply 1 drop to eye once as needed. Reported on 02/01/2016     No current facility-administered medications on file prior to visit.     PAST MEDICAL HISTORY: Past Medical History:  Diagnosis Date   Asthma    GERD (gastroesophageal reflux disease)    Hypertension     PAST SURGICAL HISTORY: History reviewed. No pertinent surgical history.  SOCIAL HISTORY: Social History   Tobacco Use   Smoking status: Never Smoker   Smokeless tobacco: Never Used  Substance Use Topics   Alcohol use: No    Alcohol/week: 0.0 standard drinks   Drug use: No    FAMILY HISTORY: Family History  Problem Relation Age of Onset   Hyperlipidemia Unknown    Hypertension Unknown    Stroke Unknown    Hypertension Mother    Hyperlipidemia Mother    Obesity Mother    Sudden death Father    ROS: Review of Systems  Endo/Heme/Allergies:       Negative for polyphagia.   PHYSICAL EXAM: Pt in no acute distress  RECENT LABS AND TESTS: BMET    Component Value Date/Time   NA 142 01/19/2019 1036   K 3.6 01/19/2019 1036   CL 97 01/19/2019 1036   CO2 26 01/19/2019 1036   GLUCOSE 80 01/19/2019 1036   GLUCOSE 103 (H) 11/07/2015 1454   BUN 19 01/19/2019 1036   CREATININE 1.22 01/19/2019 1036   CALCIUM 9.6 01/19/2019 1036   GFRNONAA 71 01/19/2019 1036   GFRAA 82 01/19/2019 1036   Lab Results  Component Value Date   HGBA1C 5.3 01/19/2019   HGBA1C 5.4 08/05/2018   HGBA1C 5.4 11/11/2017   HGBA1C 5.3 08/06/2017   HGBA1C 5.6 04/08/2017   Lab Results  Component Value Date   INSULIN 23.1 01/19/2019    INSULIN 21.7 08/05/2018   INSULIN 23.2 11/11/2017   INSULIN 27.6 (H) 08/06/2017   INSULIN 28.9 (H) 04/08/2017   CBC    Component Value Date/Time   WBC 6.6 12/24/2016 1021   WBC 10.2 11/07/2015 1454   RBC 5.47 12/24/2016 1021   RBC 5.67 11/07/2015 1454   HGB 15.2 12/24/2016 1021   HCT 44.3 12/24/2016 1021   PLT 300.0 11/07/2015 1454   MCV 81 12/24/2016 1021   MCH 27.8 12/24/2016 1021   MCH 28.0 07/18/2012 2030   MCHC 34.3 12/24/2016 1021   MCHC 33.3 11/07/2015 1454   RDW 14.6 12/24/2016 1021   LYMPHSABS 2.7 12/24/2016 1021   MONOABS 0.9 11/07/2015 1454   EOSABS 0.2 12/24/2016 1021   BASOSABS 0.1 12/24/2016 1021   Iron/TIBC/Ferritin/ %Sat  No results found for: IRON, TIBC, FERRITIN, IRONPCTSAT Lipid Panel     Component Value Date/Time   CHOL 176 01/19/2019 1036   TRIG 92 01/19/2019 1036   HDL 37 (L) 01/19/2019 1036   CHOLHDL 5 03/17/2015 0841   VLDL 22.8 03/17/2015 0841   LDLCALC 121 (H) 01/19/2019 1036   Hepatic Function Panel     Component Value Date/Time   PROT 8.0 01/19/2019 1036   ALBUMIN 4.2 01/19/2019 1036   AST 30 01/19/2019 1036   ALT 35 01/19/2019 1036   ALKPHOS 60 01/19/2019 1036   BILITOT 0.7 01/19/2019 1036   BILIDIR 0.1 11/07/2015 1454      Component Value Date/Time   TSH 2.27 09/02/2018 0913   TSH 1.370 12/24/2016 1021   TSH 1.39 03/17/2015 0841   Results for Marchia BondROOKS, Stephenson M (MRN [id number]) as of 07/19/2019 15:49  Ref. Range 01/19/2019 10:36  Vitamin D, 25-Hydroxy Latest Ref Range: 30.0 - 100.0 ng/mL 58.1   I, Marianna Paymentenise Haag, am acting as Energy managertranscriptionist for [employer] Agu[employer], PA-C I, Alois Clicheracey Aguilar, PA-C have reviewed above note and agree with its content

## 2019-08-03 ENCOUNTER — Other Ambulatory Visit: Payer: Self-pay

## 2019-08-03 ENCOUNTER — Encounter (INDEPENDENT_AMBULATORY_CARE_PROVIDER_SITE_OTHER): Payer: Self-pay | Admitting: Physician Assistant

## 2019-08-03 ENCOUNTER — Ambulatory Visit (INDEPENDENT_AMBULATORY_CARE_PROVIDER_SITE_OTHER): Payer: 59 | Admitting: Physician Assistant

## 2019-08-03 DIAGNOSIS — I10 Essential (primary) hypertension: Secondary | ICD-10-CM

## 2019-08-03 DIAGNOSIS — E66813 Obesity, class 3: Secondary | ICD-10-CM

## 2019-08-03 DIAGNOSIS — F3289 Other specified depressive episodes: Secondary | ICD-10-CM

## 2019-08-03 DIAGNOSIS — Z6841 Body Mass Index (BMI) 40.0 and over, adult: Secondary | ICD-10-CM | POA: Diagnosis not present

## 2019-08-03 MED ORDER — DILTIAZEM HCL ER COATED BEADS 240 MG PO TB24: 240.0000 mg | ORAL_TABLET | Freq: Every day | ORAL | 0 refills | Status: DC

## 2019-08-03 MED ORDER — BUPROPION HCL ER (SR) 200 MG PO TB12: 200.0000 mg | ORAL_TABLET | Freq: Every day | ORAL | 0 refills | Status: DC

## 2019-08-03 MED ORDER — CHLORTHALIDONE 25 MG PO TABS: 25.0000 mg | ORAL_TABLET | Freq: Every day | ORAL | 0 refills | Status: DC

## 2019-08-03 MED FILL — BUPROPION HCL SR 200 MG TAB: 200 | 30 days supply | Qty: 30 | Fill #0

## 2019-08-03 MED FILL — DILTIAZEM ER 240 MG TABLET: 240 | 30 days supply | Qty: 30 | Fill #0

## 2019-08-03 MED FILL — CHLORTHALIDONE 25 MG TABS: 25 | 30 days supply | Qty: 30 | Fill #0

## 2019-08-04 NOTE — Progress Notes (Signed)
Office: [phone]  /  Fax: [phone] TeleHealth Visit:  Daniel [name] has verbally consented to this TeleHealth visit today. The patient is located at home, the provider is located at the [facility]. The participants in this visit include the listed provider and patient. The visit was conducted today via Webex.  HPI:   Chief Complaint: OBESITY Daniel [name] is here to discuss his progress with his obesity treatment plan. He is on the Category 3 plan and is following his eating plan approximately 80% of the time. He states he is exercising 0 minutes 0 times per week. Daniel [name] reports that he was on vacation for a few days and got off track. He has started journaling on the weekend. We were unable to weigh the patient today for this TeleHealth visit. He feels as if he has gained weight since his last visit. He has lost 22 lbs since starting treatment with us.  Hypertension Daniel [name] is a 48 y.o. male with hypertension and is on chlorthalidone and Cardizem.  Daniel [name] denies chest pain or headache. He is working weight loss to help control his blood pressure with the goal of decreasing his risk of heart attack and stroke.  Depression with emotional eating behaviors Daniel [name] is struggling with emotional eating and using food for comfort to the extent that it is negatively impacting his health. He often snacks when he is not hungry. Daniel [name] sometimes feels he is out of control and then feels guilty that he made poor food choices. He has been working on behavior modification techniques to help reduce his emotional eating and has been somewhat successful. Daniel [name] reports no emotional eating and shows no sign of suicidal or homicidal ideations.  [facility] 12/24/2016  Decreased Interest 0 0  Down, Depressed, Hopeless 0 0  PHQ - 2 Score 0 0  Altered sleeping - 0  Tired, decreased energy - 1  Change in appetite - 0  Feeling bad or failure about  yourself  - 0  Trouble concentrating - 1  Moving slowly or fidgety/restless - 0  Suicidal thoughts - 0  PHQ-9 Score - 2   ASSESSMENT AND PLAN:  Essential hypertension - Plan: diltiazem (CARDIZEM LA) 240 MG 24 hr tablet, chlorthalidone (HYGROTON) 25 MG tablet  Other depression - with emotional eating - Plan: buPROPion (WELLBUTRIN SR) 200 MG 12 hr tablet  Class 3 severe obesity with serious comorbidity and body mass index (BMI) of 40.0 to 44.9 in adult, unspecified obesity type (HCC)  PLAN:  Hypertension We discussed sodium restriction, working on healthy weight loss, and a regular exercise program as the means to achieve improved blood pressure control. Jud agreed with this plan and agreed to follow up as directed. We will continue to monitor his blood pressure as well as his progress with the above lifestyle modifications. Daniel [name] was given a refill on his Cardizem #30 with 0 refills and a refill on his chlorthalidone #30 with 0 refills and agrees to follow-up with our clinic in 4 weeks (per patient). He will watch for signs of hypotension as he continues his lifestyle modifications.  Depression with Emotional Eating Behaviors We discussed behavior modification techniques today to help Daniel [name] deal with his emotional eating and depression. Daniel [name] was given a refill on his Wellbutrin and dose was changed to 200 mg #30 with 0 refills. He agrees to follow-up with our clinic in 4 weeks (per patient).  Obesity Daniel [name] is currently in the action  stage of change. As such, his goal is to continue with weight loss efforts. He has agreed to keep a food journal with 1500 calories and 95 grams of protein daily. Daniel [name] has been instructed to work up to a goal of 150 minutes of combined cardio and strengthening exercise per week for weight loss and overall health benefits. We discussed the following Behavioral Modification Strategies today: work on meal planning and easy cooking plans, and keeping  healthy foods in the home.  Daniel [name] has agreed to follow-up with our clinic in 4 weeks (per patient). He was informed of the importance of frequent follow-up visits to maximize his success with intensive lifestyle modifications for his multiple health conditions.  ALLERGIES: Allergies  Allergen Reactions   Bee Venom Anaphylaxis   Shellfish Allergy Anaphylaxis    MEDICATIONS: Current Outpatient Medications on File Prior to Visit  Medication Sig Dispense Refill   albuterol (VENTOLIN HFA) 108 (90 Base) MCG/ACT inhaler INHALE 2 PUFFS INTO THE LUNGS EVERY 4 HOURS AS NEEDED FOR WHEEZING OR SHORTNESS OF BREATH. 18 g 5   EPINEPHrine (EPIPEN 2-PAK) 0.3 mg/0.3 mL SOAJ injection Inject 0.3 mLs (0.3 mg total) into the muscle once. 1 Device 11   etodolac (LODINE) 400 MG tablet Take 1 tablet (400 mg total) by mouth 2 (two) times daily as needed. 60 tablet 3   levocetirizine (XYZAL) 5 MG tablet Take 1 tablet (5 mg total) by mouth every evening. 90 tablet 3   montelukast (SINGULAIR) 10 MG tablet Take 1 tablet (10 mg total) by mouth at bedtime. 90 tablet 3   Multiple Vitamin (MULTIVITAMIN WITH MINERALS) TABS Take 1 tablet by mouth daily.     omeprazole (PRILOSEC) 20 MG capsule TAKE 1 CAPSULE BY MOUTH ONCE DAILY 90 capsule 3   polyethylene glycol powder (GLYCOLAX/MIRALAX) powder Take 17 g by mouth daily as needed. 3350 g 1   Polyvinyl Alcohol-Povidone (CLEAR EYES ALL SEASONS) 5-6 MG/ML SOLN Apply 1 drop to eye once as needed. Reported on 02/01/2016     No current facility-administered medications on file prior to visit.     PAST MEDICAL HISTORY: Past Medical History:  Diagnosis Date   Asthma    GERD (gastroesophageal reflux disease)    Hypertension     PAST SURGICAL HISTORY: History reviewed. No pertinent surgical history.  SOCIAL HISTORY: Social History   Tobacco Use   Smoking status: Never Smoker   Smokeless tobacco: Never Used  Substance Use Topics   Alcohol use: No     Alcohol/week: 0.0 standard drinks   Drug use: No    FAMILY HISTORY: Family History  Problem Relation Age of Onset   Hyperlipidemia Unknown    Hypertension Unknown    Stroke Unknown    Hypertension Mother    Hyperlipidemia Mother    Obesity Mother    Sudden death Father    ROS: Review of Systems  Cardiovascular: Negative for chest pain.  Neurological: Negative for headaches.  Psychiatric/Behavioral: Positive for depression (emotional eating). Negative for suicidal ideas.       Negative for homicidal ideas.   PHYSICAL EXAM: Pt in no acute distress  RECENT LABS AND TESTS: BMET    Component Value Date/Time   NA 142 01/19/2019 1036   K 3.6 01/19/2019 1036   CL 97 01/19/2019 1036   CO2 26 01/19/2019 1036   GLUCOSE 80 01/19/2019 1036   GLUCOSE 103 (H) 11/07/2015 1454   BUN 19 01/19/2019 1036   CREATININE 1.22 01/19/2019 1036   CALCIUM 9.6  01/19/2019 1036   GFRNONAA 71 01/19/2019 1036   GFRAA 82 01/19/2019 1036   Lab Results  Component Value Date   HGBA1C 5.3 01/19/2019   HGBA1C 5.4 08/05/2018   HGBA1C 5.4 11/11/2017   HGBA1C 5.3 08/06/2017   HGBA1C 5.6 04/08/2017   Lab Results  Component Value Date   INSULIN 23.1 01/19/2019   INSULIN 21.7 08/05/2018   INSULIN 23.2 11/11/2017   INSULIN 27.6 (H) 08/06/2017   INSULIN 28.9 (H) 04/08/2017   CBC    Component Value Date/Time   WBC 6.6 12/24/2016 1021   WBC 10.2 11/07/2015 1454   RBC 5.47 12/24/2016 1021   RBC 5.67 11/07/2015 1454   HGB 15.2 12/24/2016 1021   HCT 44.3 12/24/2016 1021   PLT 300.0 11/07/2015 1454   MCV 81 12/24/2016 1021   MCH 27.8 12/24/2016 1021   MCH 28.0 07/18/2012 2030   MCHC 34.3 12/24/2016 1021   MCHC 33.3 11/07/2015 1454   RDW 14.6 12/24/2016 1021   LYMPHSABS 2.7 12/24/2016 1021   MONOABS 0.9 11/07/2015 1454   EOSABS 0.2 12/24/2016 1021   BASOSABS 0.1 12/24/2016 1021   Iron/TIBC/Ferritin/ %Sat No results found for: IRON, TIBC, FERRITIN, IRONPCTSAT Lipid Panel       Component Value Date/Time   CHOL 176 01/19/2019 1036   TRIG 92 01/19/2019 1036   HDL 37 (L) 01/19/2019 1036   CHOLHDL 5 03/17/2015 0841   VLDL 22.8 03/17/2015 0841   LDLCALC 121 (H) 01/19/2019 1036   Hepatic Function Panel     Component Value Date/Time   PROT 8.0 01/19/2019 1036   ALBUMIN 4.2 01/19/2019 1036   AST 30 01/19/2019 1036   ALT 35 01/19/2019 1036   ALKPHOS 60 01/19/2019 1036   BILITOT 0.7 01/19/2019 1036   BILIDIR 0.1 11/07/2015 1454      Component Value Date/Time   TSH 2.27 09/02/2018 0913   TSH 1.370 12/24/2016 1021   TSH 1.39 03/17/2015 0841   Results for KAISEN, ACKERS (MRN [id number]) as of 08/04/2019 07:44  Ref. Range 01/19/2019 10:36  Vitamin D, 25-Hydroxy Latest Ref Range: 30.0 - 100.0 ng/mL 58.1   I, Michaelene Song, am acting as Location manager for [employer], PA-C I, Abby Potash, PA-C have reviewed above note and agree with its content

## 2019-08-06 ENCOUNTER — Other Ambulatory Visit: Payer: Self-pay

## 2019-08-06 ENCOUNTER — Encounter: Payer: Self-pay | Admitting: Podiatry

## 2019-08-06 ENCOUNTER — Encounter: Payer: Self-pay | Admitting: Family Medicine

## 2019-08-06 ENCOUNTER — Ambulatory Visit (INDEPENDENT_AMBULATORY_CARE_PROVIDER_SITE_OTHER): Payer: 59 | Admitting: Podiatry

## 2019-08-06 ENCOUNTER — Ambulatory Visit (INDEPENDENT_AMBULATORY_CARE_PROVIDER_SITE_OTHER): Payer: 59

## 2019-08-06 VITALS — BP 160/104 | HR 66

## 2019-08-06 DIAGNOSIS — M1000 Idiopathic gout, unspecified site: Secondary | ICD-10-CM

## 2019-08-06 DIAGNOSIS — M7750 Other enthesopathy of unspecified foot: Secondary | ICD-10-CM

## 2019-08-06 DIAGNOSIS — M7751 Other enthesopathy of right foot: Secondary | ICD-10-CM

## 2019-08-06 MED ORDER — METHYLPREDNISOLONE 4 MG PO TBPK: ORAL_TABLET | ORAL | 0 refills | Status: DC

## 2019-08-06 MED FILL — METHYLPREDNISOLONE 4 MG TAB: 4 | 6 days supply | Qty: 21 | Fill #0

## 2019-08-06 NOTE — Patient Instructions (Signed)
Gout  Gout is painful swelling of your joints. Gout is a type of arthritis. It is caused by having too much uric acid in your body. Uric acid is a chemical that is made when your body breaks down substances called purines. If your body has too much uric acid, sharp crystals can form and build up in your joints. This causes pain and swelling. Gout attacks can happen quickly and be very painful (acute gout). Over time, the attacks can affect more joints and happen more often (chronic gout). What are the causes?  Too much uric acid in your blood. This can happen because: ? Your kidneys do not remove enough uric acid from your blood. ? Your body makes too much uric acid. ? You eat too many foods that are high in purines. These foods include organ meats, some seafood, and beer.  Trauma or stress. What increases the risk?  Having a family history of gout.  Being male and middle-aged.  Being male and having gone through menopause.  Being very overweight (obese).  Drinking alcohol, especially beer.  Not having enough water in the body (being dehydrated).  Losing weight too quickly.  Having an organ transplant.  Having lead poisoning.  Taking certain medicines.  Having kidney disease.  Having a skin condition called psoriasis. What are the signs or symptoms? An attack of acute gout usually happens in just one joint. The most common place is the big toe. Attacks often start at night. Other joints that may be affected include joints of the feet, ankle, knee, fingers, wrist, or elbow. Symptoms of an attack may include:  Very bad pain.  Warmth.  Swelling.  Stiffness.  Shiny, red, or purple skin.  Tenderness. The affected joint may be very painful to touch.  Chills and fever. Chronic gout may cause symptoms more often. More joints may be involved. You may also have white or yellow lumps (tophi) on your hands or feet or in other areas near your joints. How is this  treated?  Treatment for this condition has two phases: treating an acute attack and preventing future attacks.  Acute gout treatment may include: ? NSAIDs. ? Steroids. These are taken by mouth or injected into a joint. ? Colchicine. This medicine relieves pain and swelling. It can be given by mouth or through an IV tube.  Preventive treatment may include: ? Taking small doses of NSAIDs or colchicine daily. ? Using a medicine that reduces uric acid levels in your blood. ? Making changes to your diet. You may need to see a food expert (dietitian) about what to eat and drink to prevent gout. Follow these instructions at home: During a gout attack   If told, put ice on the painful area: ? Put ice in a plastic bag. ? Place a towel between your skin and the bag. ? Leave the ice on for 20 minutes, 2-3 times a day.  Raise (elevate) the painful joint above the level of your heart as often as you can.  Rest the joint as much as possible. If the joint is in your leg, you may be given crutches.  Follow instructions from your doctor about what you cannot eat or drink. Avoiding future gout attacks  Eat a low-purine diet. Avoid foods and drinks such as: ? Liver. ? Kidney. ? Anchovies. ? Asparagus. ? Herring. ? Mushrooms. ? Mussels. ? Beer.  Stay at a healthy weight. If you want to lose weight, talk with your doctor. Do not lose weight   too fast.  Start or continue an exercise plan as told by your doctor. Eating and drinking  Drink enough fluids to keep your pee (urine) pale yellow.  If you drink alcohol: ? Limit how much you use to:  0-1 drink a day for women.  0-2 drinks a day for men. ? Be aware of how much alcohol is in your drink. In the [place] one drink equals one 12 oz bottle of beer (355 mL), one 5 oz glass of wine (148 mL), or one 1 oz glass of hard liquor (44 mL). General instructions  Take over-the-counter and prescription medicines only as told by your doctor.  Do  not drive or use heavy machinery while taking prescription pain medicine.  Return to your normal activities as told by your doctor. Ask your doctor what activities are safe for you.  Keep all follow-up visits as told by your doctor. This is important. Contact a doctor if:  You have another gout attack.  You still have symptoms of a gout attack after 10 days of treatment.  You have problems (side effects) because of your medicines.  You have chills or a fever.  You have burning pain when you pee (urinate).  You have pain in your lower back or belly. Get help right away if:  You have very bad pain.  Your pain cannot be controlled.  You cannot pee. Summary  Gout is painful swelling of the joints.  The most common site of pain is the big toe, but it can affect other joints.  Medicines and avoiding some foods can help to prevent and treat gout attacks. This information is not intended to replace advice given to you by your health care provider. Make sure you discuss any questions you have with your health care provider. Document Released: 08/20/2008 Document Revised: 06/03/2018 Document Reviewed: 06/03/2018 Elsevier Patient Education  2020 [employer]  

## 2019-08-07 LAB — ANA, IFA COMPREHENSIVE PANEL
Anti Nuclear Antibody (ANA): NEGATIVE
ENA SM Ab Ser-aCnc: <1 AI
SM/RNP: <1 AI
SSA (Ro) (ENA) Antibody, IgG: <1 AI
SSB (La) (ENA) Antibody, IgG: <1 AI
Scleroderma (Scl-70) (ENA) Antibody, IgG: <1 AI
ds DNA Ab: <1 IU/mL

## 2019-08-07 LAB — URIC ACID: Uric Acid, Serum: 8.2 mg/dL — ABNORMAL HIGH (ref 4.0–8.0)

## 2019-08-07 LAB — SEDIMENTATION RATE: Sed Rate: 14 mm/h (ref 0–15)

## 2019-08-07 LAB — RHEUMATOID FACTOR: Rheumatoid fact SerPl-aCnc: <14 IU/mL (ref <14)

## 2019-08-07 LAB — C-REACTIVE PROTEIN: CRP: 19.2 mg/L — ABNORMAL HIGH (ref <8.0)

## 2019-08-11 NOTE — Progress Notes (Signed)
Subjective:   Patient ID: Daniel [name], male   DOB: 48 y.o.   MRN: [id number]   HPI Patient presents with a lot of pain in the outside of the right foot states that it is been bothering him and he does not know whether gout could also be part of this.  Patient does not smoke likes to be active   Review of Systems  All other systems reviewed and are negative.       Objective:  Physical Exam Vitals signs and nursing note reviewed.  Constitutional:      Appearance: He is well-developed.  Pulmonary:     Effort: Pulmonary effort is normal.  Musculoskeletal: Normal range of motion.  Skin:    General: Skin is warm.  Neurological:     Mental Status: He is alert.     Neurovascular status found to be intact muscle strength found to be adequate range of motion within normal limits with patient found to have acute inflammation in the lateral side of the right foot with fluid buildup around the peroneal tendons with quite a bit of pain with palpation.  Patient not have any motion loss associated with this     Assessment:  Inflammatory peroneal tendinitis with possibility for gout-like symptomatology     Plan:  H&P reviewed both conditions and gout and at this point I did do sterile prep and injected the tendon complex 3 mg Dexasone Kenalog 5 mg Xylocaine and this and sent for blood work to see whether or not there could possibly be gout as precipitating factor.  Placed on anti-inflammatory reappoint to recheck  X-rays were negative for signs of fracture or arthritic process associated with condition

## 2019-08-12 ENCOUNTER — Other Ambulatory Visit: Payer: Self-pay

## 2019-08-12 ENCOUNTER — Encounter: Payer: Self-pay | Admitting: Podiatry

## 2019-08-12 ENCOUNTER — Ambulatory Visit (INDEPENDENT_AMBULATORY_CARE_PROVIDER_SITE_OTHER): Payer: 59 | Admitting: Podiatry

## 2019-08-12 DIAGNOSIS — M1000 Idiopathic gout, unspecified site: Secondary | ICD-10-CM | POA: Diagnosis not present

## 2019-08-12 DIAGNOSIS — M7750 Other enthesopathy of unspecified foot: Secondary | ICD-10-CM | POA: Diagnosis not present

## 2019-08-12 MED FILL — MONTELUKAST SOD 10 MG TAB: 10 | 90 days supply | Qty: 90 | Fill #1

## 2019-08-12 MED FILL — LEVOCETIRIZINE 5 MG TABLET: 5 | 90 days supply | Qty: 90 | Fill #1

## 2019-08-16 NOTE — Progress Notes (Signed)
Subjective:   Patient ID: Daniel [name], male   DOB: 48 y.o.   MRN: [id number]   HPI Patient states his foot feels so much better and he is walking with a much better heel toe gait pattern   ROS      Objective:  Physical Exam  Neurovascular status intact negative Homans sign noted with patient's left foot healing much better minimal discomfort noted upon pressure and good heel toe gait pattern     Assessment:  Doing well post treatment for fascial-like symptomatology     Plan:  H&P condition reviewed and advised on support therapy anti-inflammatories and the possibility for orthotics or other treatment depending on response.  Reviewed at great length all things that he can do and discussed again physical therapy for him to perform

## 2019-09-01 ENCOUNTER — Other Ambulatory Visit: Payer: Self-pay

## 2019-09-01 ENCOUNTER — Ambulatory Visit (INDEPENDENT_AMBULATORY_CARE_PROVIDER_SITE_OTHER): Payer: 59 | Admitting: Physician Assistant

## 2019-09-01 VITALS — BP 147/79 | HR 72 | Temp 98.3°F | Ht 69.0 in | Wt 287.0 lb

## 2019-09-01 DIAGNOSIS — Z6841 Body Mass Index (BMI) 40.0 and over, adult: Secondary | ICD-10-CM

## 2019-09-01 DIAGNOSIS — K219 Gastro-esophageal reflux disease without esophagitis: Secondary | ICD-10-CM | POA: Diagnosis not present

## 2019-09-01 NOTE — Progress Notes (Signed)
Office: [phone]  /  Fax: [phone]   HPI:   Chief Complaint: OBESITY Daniel [name] is here to discuss his progress with his obesity treatment plan. He is on the Category 3 plan and is following his eating plan approximately 60% of the time. He states he is doing cardio/weight training 60 minutes 3 times per week. Daniel [name] reports that he has started working out again. He is still not getting all of his protein in. His weight is 287 lb (130.2 kg) today and has not lost weight since his last visit. He has lost 22 lbs since starting treatment with [name].  Gastroesophageal Reflux Disease (GERD) Daniel [name] has a diagnosis of GERD and is on omeprazole. He reports no current symptoms and no black stools.  ASSESSMENT AND PLAN:  Gastroesophageal reflux disease, unspecified whether esophagitis present  Class 3 severe obesity with serious comorbidity and body mass index (BMI) of 40.0 to 44.9 in adult, unspecified obesity type (HCC)  PLAN:  Gastroesophageal Reflux Disease (GERD) Daniel [name] was instructed to continue his medications and weight loss. He will follow-up with our clinic as directed to monitor his progress.  I spent > than 50% of the 25 minute visit on counseling as documented in the note.  Obesity Daniel [name] is currently in the action stage of change. As such, his goal is to continue with weight loss efforts. He has agreed to follow the Category 3 plan. Daniel [name] has been instructed to work up to a goal of 150 minutes of combined cardio and strengthening exercise per week for weight loss and overall health benefits. We discussed the following Behavioral Modification Strategies today: work on meal planning and easy cooking plans, and keeping healthy foods in the home.  Daniel [name] has agreed to follow-up with our clinic in 2 weeks. He was informed of the importance of frequent follow-up visits to maximize his success with intensive lifestyle modifications for his multiple health conditions.  I spent >  than 50% of the 25 minute visit on counseling as documented in the note.   ALLERGIES: Allergies  Allergen Reactions   Bee Venom Anaphylaxis   Shellfish Allergy Anaphylaxis    MEDICATIONS: Current Outpatient Medications on File Prior to Visit  Medication Sig Dispense Refill   albuterol (VENTOLIN HFA) 108 (90 Base) MCG/ACT inhaler INHALE 2 PUFFS INTO THE LUNGS EVERY 4 HOURS AS NEEDED FOR WHEEZING OR SHORTNESS OF BREATH. 18 g 5   buPROPion (WELLBUTRIN SR) 200 MG 12 hr tablet Take 1 tablet (200 mg total) by mouth daily. 30 tablet 0   chlorthalidone (HYGROTON) 25 MG tablet Take 1 tablet (25 mg total) by mouth daily. 30 tablet 0   diltiazem (CARDIZEM LA) 240 MG 24 hr tablet Take 1 tablet (240 mg total) by mouth daily. 30 tablet 0   EPINEPHrine (EPIPEN 2-PAK) 0.3 mg/0.3 mL SOAJ injection Inject 0.3 mLs (0.3 mg total) into the muscle once. 1 Device 11   etodolac (LODINE) 400 MG tablet Take 1 tablet (400 mg total) by mouth 2 (two) times daily as needed. 60 tablet 3   levocetirizine (XYZAL) 5 MG tablet Take 1 tablet (5 mg total) by mouth every evening. 90 tablet 3   methylPREDNISolone (MEDROL DOSEPAK) 4 MG TBPK tablet follow package directions 21 tablet 0   montelukast (SINGULAIR) 10 MG tablet Take 1 tablet (10 mg total) by mouth at bedtime. 90 tablet 3   Multiple Vitamin (MULTIVITAMIN WITH MINERALS) TABS Take 1 tablet by mouth daily.     omeprazole (PRILOSEC) 20 MG capsule TAKE  1 CAPSULE BY MOUTH ONCE DAILY 90 capsule 3   polyethylene glycol powder (GLYCOLAX/MIRALAX) powder Take 17 g by mouth daily as needed. 3350 g 1   Polyvinyl Alcohol-Povidone (CLEAR EYES ALL SEASONS) 5-6 MG/ML SOLN Apply 1 drop to eye once as needed. Reported on 02/01/2016     No current facility-administered medications on file prior to visit.     PAST MEDICAL HISTORY: Past Medical History:  Diagnosis Date   Asthma    GERD (gastroesophageal reflux disease)    Hypertension     PAST SURGICAL  HISTORY: No past surgical history on file.  SOCIAL HISTORY: Social History   Tobacco Use   Smoking status: Never Smoker   Smokeless tobacco: Never Used  Substance Use Topics   Alcohol use: No    Alcohol/week: 0.0 standard drinks   Drug use: No    FAMILY HISTORY: Family History  Problem Relation Age of Onset   Hyperlipidemia Unknown    Hypertension Unknown    Stroke Unknown    Hypertension Mother    Hyperlipidemia Mother    Obesity Mother    Sudden death Father    ROS: Review of Systems  Gastrointestinal:       Positive for gastroesophageal reflux disease.   PHYSICAL EXAM: Blood pressure (!) 147/79, pulse 72, temperature 98.3 F (36.8 C), temperature source Oral, height 5\' 9"  (1.753 [name]), weight 287 lb (130.2 kg), SpO2 94 %. Body mass index is 42.38 kg/[name]. Physical Exam Vitals signs reviewed.  Constitutional:      Appearance: Normal appearance. He is obese.  Cardiovascular:     Rate and Rhythm: Normal rate.     Pulses: Normal pulses.  Pulmonary:     Effort: Pulmonary effort is normal.     Breath sounds: Normal breath sounds.  Musculoskeletal: Normal range of motion.  Skin:    General: Skin is warm and dry.  Neurological:     Mental Status: He is alert and oriented to person, place, and time.  Psychiatric:        Behavior: Behavior normal.   RECENT LABS AND TESTS: BMET    Component Value Date/Time   NA 142 01/19/2019 1036   K 3.6 01/19/2019 1036   CL 97 01/19/2019 1036   CO2 26 01/19/2019 1036   GLUCOSE 80 01/19/2019 1036   GLUCOSE 103 (H) 11/07/2015 1454   BUN 19 01/19/2019 1036   CREATININE 1.22 01/19/2019 1036   CALCIUM 9.6 01/19/2019 1036   GFRNONAA 71 01/19/2019 1036   GFRAA 82 01/19/2019 1036   Lab Results  Component Value Date   HGBA1C 5.3 01/19/2019   HGBA1C 5.4 08/05/2018   HGBA1C 5.4 11/11/2017   HGBA1C 5.3 08/06/2017   HGBA1C 5.6 04/08/2017   Lab Results  Component Value Date   INSULIN 23.1 01/19/2019   INSULIN 21.7  08/05/2018   INSULIN 23.2 11/11/2017   INSULIN 27.6 (H) 08/06/2017   INSULIN 28.9 (H) 04/08/2017   CBC    Component Value Date/Time   WBC 6.6 12/24/2016 1021   WBC 10.2 11/07/2015 1454   RBC 5.47 12/24/2016 1021   RBC 5.67 11/07/2015 1454   HGB 15.2 12/24/2016 1021   HCT 44.3 12/24/2016 1021   PLT 300.0 11/07/2015 1454   MCV 81 12/24/2016 1021   MCH 27.8 12/24/2016 1021   MCH 28.0 07/18/2012 2030   MCHC 34.3 12/24/2016 1021   MCHC 33.3 11/07/2015 1454   RDW 14.6 12/24/2016 1021   LYMPHSABS 2.7 12/24/2016 1021   MONOABS 0.9 11/07/2015  1454   EOSABS 0.2 12/24/2016 1021   BASOSABS 0.1 12/24/2016 1021   Iron/TIBC/Ferritin/ %Sat No results found for: IRON, TIBC, FERRITIN, IRONPCTSAT Lipid Panel     Component Value Date/Time   CHOL 176 01/19/2019 1036   TRIG 92 01/19/2019 1036   HDL 37 (L) 01/19/2019 1036   CHOLHDL 5 03/17/2015 0841   VLDL 22.8 03/17/2015 0841   LDLCALC 121 (H) 01/19/2019 1036   Hepatic Function Panel     Component Value Date/Time   PROT 8.0 01/19/2019 1036   ALBUMIN 4.2 01/19/2019 1036   AST 30 01/19/2019 1036   ALT 35 01/19/2019 1036   ALKPHOS 60 01/19/2019 1036   BILITOT 0.7 01/19/2019 1036   BILIDIR 0.1 11/07/2015 1454      Component Value Date/Time   TSH 2.27 09/02/2018 0913   TSH 1.370 12/24/2016 1021   TSH 1.39 03/17/2015 0841   Results for Daniel BondROOKS, Daniel [name] (MRN [id number]) as of 09/01/2019 13:47  Ref. Range 01/19/2019 10:36  Vitamin D, 25-Hydroxy Latest Ref Range: 30.0 - 100.0 ng/mL 58.1   OBESITY BEHAVIORAL INTERVENTION VISIT  Today's visit was #51  Starting weight: 309 lbs Starting date: 12/24/2016 Today's weight: 287 lbs  Today's date: 09/01/2019 Total lbs lost to date: 22    09/01/2019  Height 5\' 9"  (1.753 [name])  Weight 287 lb (130.2 kg)  BMI (Calculated) 42.36  BLOOD PRESSURE - SYSTOLIC 147  BLOOD PRESSURE - DIASTOLIC 79   Body Fat % 36.4 %  Total Body Water (lbs) 123 lbs   ASK: We discussed the diagnosis of obesity with  Daniel [name] today and Daniel [name] agreed to give us permission to discuss obesity behavioral modification therapy today.  ASSESS: Daniel [name] has the diagnosis of obesity and his BMI today is 42.5. Daniel [name] is in the action stage of change.   ADVISE: Daniel [name] was educated on the multiple health risks of obesity as well as the benefit of weight loss to improve his health. He was advised of the need for long term treatment and the importance of lifestyle modifications to improve his current health and to decrease his risk of future health problems.  AGREE: Multiple dietary modification options and treatment options were discussed and  Daniel [name] agreed to follow the recommendations documented in the above note.  ARRANGE: Daniel [name] was educated on the importance of frequent visits to treat obesity as outlined per CMS and USPSTF guidelines and agreed to schedule his next follow up appointment today.  Fernanda DrumI, Denise Haag, am acting as transcriptionist for Alois Clicheracey Aguilar, PA-C I, Alois Clicheracey Aguilar, PA-C have reviewed above note and agree with its content

## 2019-09-20 ENCOUNTER — Ambulatory Visit (INDEPENDENT_AMBULATORY_CARE_PROVIDER_SITE_OTHER): Payer: 59 | Admitting: Physician Assistant

## 2019-09-20 ENCOUNTER — Other Ambulatory Visit: Payer: Self-pay

## 2019-09-20 VITALS — BP 138/82 | HR 74 | Temp 98.2°F | Ht 69.0 in | Wt 286.0 lb

## 2019-09-20 DIAGNOSIS — Z9189 Other specified personal risk factors, not elsewhere classified: Secondary | ICD-10-CM | POA: Diagnosis not present

## 2019-09-20 DIAGNOSIS — E559 Vitamin D deficiency, unspecified: Secondary | ICD-10-CM

## 2019-09-20 DIAGNOSIS — Z6841 Body Mass Index (BMI) 40.0 and over, adult: Secondary | ICD-10-CM

## 2019-09-20 DIAGNOSIS — M79671 Pain in right foot: Secondary | ICD-10-CM

## 2019-09-20 DIAGNOSIS — R7303 Prediabetes: Secondary | ICD-10-CM | POA: Diagnosis not present

## 2019-09-20 DIAGNOSIS — E7849 Other hyperlipidemia: Secondary | ICD-10-CM

## 2019-09-21 LAB — HEMOGLOBIN A1C
Est. average glucose Bld gHb Est-mCnc: 111 mg/dL
Hgb A1c MFr Bld: 5.5 % (ref 4.8–5.6)

## 2019-09-21 LAB — COMPREHENSIVE METABOLIC PANEL
ALT: 20 IU/L (ref 0–44)
AST: 14 IU/L (ref 0–40)
Albumin/Globulin Ratio: 1.3 (ref 1.2–2.2)
Albumin: 4.4 g/dL (ref 4.0–5.0)
Alkaline Phosphatase: 74 IU/L (ref 39–117)
BUN/Creatinine Ratio: 12 (ref 9–20)
BUN: 16 mg/dL (ref 6–24)
Bilirubin Total: 0.3 mg/dL (ref 0.0–1.2)
CO2: 26 mmol/L (ref 20–29)
Calcium: 9.4 mg/dL (ref 8.7–10.2)
Chloride: 110 mmol/L — ABNORMAL HIGH (ref 96–106)
Creatinine, Ser: 1.34 mg/dL — ABNORMAL HIGH (ref 0.76–1.27)
GFR calc Af Amer: 73 mL/min/{1.73_m2} (ref >59)
GFR calc non Af Amer: 63 mL/min/{1.73_m2} (ref >59)
Globulin, Total: 3.3 g/dL (ref 1.5–4.5)
Glucose: 83 mg/dL (ref 65–99)
Potassium: 4.4 mmol/L (ref 3.5–5.2)
Sodium: 147 mmol/L — ABNORMAL HIGH (ref 134–144)
Total Protein: 7.7 g/dL (ref 6.0–8.5)

## 2019-09-21 LAB — LIPID PANEL WITH LDL/HDL RATIO
Cholesterol, Total: 197 mg/dL (ref 100–199)
HDL: 42 mg/dL (ref >39)
LDL Chol Calc (NIH): 138 mg/dL — ABNORMAL HIGH (ref 0–99)
LDL/HDL Ratio: 3.3 ratio (ref 0.0–3.6)
Triglycerides: 92 mg/dL (ref 0–149)
VLDL Cholesterol Cal: 17 mg/dL (ref 5–40)

## 2019-09-21 LAB — INSULIN, RANDOM: INSULIN: 24.1 u[IU]/mL (ref 2.6–24.9)

## 2019-09-21 LAB — VITAMIN D 25 HYDROXY (VIT D DEFICIENCY, FRACTURES): Vit D, 25-Hydroxy: 58.9 ng/mL (ref 30.0–100.0)

## 2019-09-21 LAB — URIC ACID: Uric Acid: 8.6 mg/dL (ref 3.7–8.6)

## 2019-09-21 NOTE — Progress Notes (Signed)
Office: [phone]  /  Fax: [phone]   HPI:   Chief Complaint: OBESITY Daniel [name] is here to discuss his progress with his obesity treatment plan. He is on the Category 3 plan and is following his eating plan approximately 50 to 60 % of the time. He states he is doing cardio exercise and weight training 45 minutes 5 times per week. Daniel [name] reports that he is working out more often. He is not having any issues with the plan. His weight is 286 lb (129.7 kg) today and has had a weight loss of 1 pound over a period of 3 weeks since his last visit. He has lost 23 lbs since starting treatment with Daniel [name].  Pre-Diabetes Daniel [name] has a diagnosis of prediabetes based on his elevated Hgb A1c and was informed this puts him at greater risk of developing diabetes. Daniel [name] is not taking medications currently and he continues to work on diet and exercise to decrease risk of diabetes. He denies polyphagia.  At risk for diabetes Daniel [name] is at higher than average risk for developing diabetes due to his obesity and prediabetes. He currently denies polyuria or polydipsia.  Vitamin D deficiency Daniel [name] has a diagnosis of vitamin D deficiency. Daniel [name] is not currently taking vit D and he denies nausea, vomiting or muscle weakness.  Hyperlipidemia Daniel [name] has hyperlipidemia and he is not on medications. He has been trying to improve his cholesterol levels with intensive lifestyle modification including a low saturated fat diet, exercise and weight loss. He denies any chest pain.  Elevated Uric Acid (right foot pain and swelling) Daniel [name] has right foot swelling and he has no redness.  ASSESSMENT AND PLAN:  Prediabetes - Plan: Hemoglobin A1c, Insulin, random, Comprehensive metabolic panel, Uric acid  Vitamin D deficiency - Plan: VITAMIN D 25 Hydroxy (Vit-D Deficiency, Fractures)  Other hyperlipidemia - Plan: Lipid Panel With LDL/HDL Ratio  At risk for diabetes mellitus  Class 3 severe obesity with serious  comorbidity and body mass index (BMI) of 40.0 to 44.9 in adult, unspecified obesity type [facility](HCC)  PLAN:  Pre-Diabetes Daniel [name] will continue to work on weight loss, exercise, and decreasing simple carbohydrates in his diet to help decrease the risk of diabetes. He was informed that eating too many simple carbohydrates or too many calories at one sitting increases the likelihood of GI side effects. Daniel [name] agreed to follow up with Daniel [name] as directed to monitor his progress.  Diabetes risk counseling Daniel [name] was given extended (15 minutes) diabetes prevention counseling today. He is 48 y.o. male and has risk factors for diabetes including obesity and prediabetes. We discussed intensive lifestyle modifications today with an emphasis on weight loss as well as increasing exercise and decreasing simple carbohydrates in his diet.  Vitamin D Deficiency Daniel [name] was informed that low vitamin D levels contributes to fatigue and are associated with obesity, breast, and colon cancer. We will check labs today and Daniel [name] will follow up for routine testing of vitamin D, at least 2-3 times per year.   Hyperlipidemia Daniel [name] was informed of the American Heart Association Guidelines emphasizing intensive lifestyle modifications as the first line treatment for hyperlipidemia. We discussed many lifestyle modifications today in depth, and Daniel [name] will continue to work on decreasing saturated fats such as fatty red meat, butter and many fried foods. He will also increase vegetables and lean protein in his diet and continue to work on exercise and weight loss efforts. We will check labs and Daniel [name] will follow up as directed.  Elevated Uric Acid (  right foot pain and swelling) We will check uric acid and Daniel [name] agrees to follow up with our clinic 3 weeks.  Obesity Daniel [name] is currently in the action stage of change. As such, his goal is to continue with weight loss efforts He has agreed to follow the Category 3 plan Daniel [name] has been  instructed to work up to a goal of 150 minutes of combined cardio and strengthening exercise per week for weight loss and overall health benefits. We discussed the following Behavioral Modification Strategies today: keeping healthy foods in the home and work on meal planning and easy cooking plans  Daniel [name] has agreed to follow up with our clinic in 3 weeks. He was informed of the importance of frequent follow up visits to maximize his success with intensive lifestyle modifications for his multiple health conditions.  ALLERGIES: Allergies  Allergen Reactions   Bee Venom Anaphylaxis   Shellfish Allergy Anaphylaxis    MEDICATIONS: Current Outpatient Medications on File Prior to Visit  Medication Sig Dispense Refill   albuterol (VENTOLIN HFA) 108 (90 Base) MCG/ACT inhaler INHALE 2 PUFFS INTO THE LUNGS EVERY 4 HOURS AS NEEDED FOR WHEEZING OR SHORTNESS OF BREATH. 18 g 5   buPROPion (WELLBUTRIN SR) 200 MG 12 hr tablet Take 1 tablet (200 mg total) by mouth daily. 30 tablet 0   chlorthalidone (HYGROTON) 25 MG tablet Take 1 tablet (25 mg total) by mouth daily. 30 tablet 0   diltiazem (CARDIZEM LA) 240 MG 24 hr tablet Take 1 tablet (240 mg total) by mouth daily. 30 tablet 0   EPINEPHrine (EPIPEN 2-PAK) 0.3 mg/0.3 mL SOAJ injection Inject 0.3 mLs (0.3 mg total) into the muscle once. 1 Device 11   etodolac (LODINE) 400 MG tablet Take 1 tablet (400 mg total) by mouth 2 (two) times daily as needed. 60 tablet 3   levocetirizine (XYZAL) 5 MG tablet Take 1 tablet (5 mg total) by mouth every evening. 90 tablet 3   montelukast (SINGULAIR) 10 MG tablet Take 1 tablet (10 mg total) by mouth at bedtime. 90 tablet 3   Multiple Vitamin (MULTIVITAMIN WITH MINERALS) TABS Take 1 tablet by mouth daily.     polyethylene glycol powder (GLYCOLAX/MIRALAX) powder Take 17 g by mouth daily as needed. 3350 g 1   Polyvinyl Alcohol-Povidone (CLEAR EYES ALL SEASONS) 5-6 MG/ML SOLN Apply 1 drop to eye once as needed.  Reported on 02/01/2016     No current facility-administered medications on file prior to visit.     PAST MEDICAL HISTORY: Past Medical History:  Diagnosis Date   Asthma    GERD (gastroesophageal reflux disease)    Hypertension     PAST SURGICAL HISTORY: No past surgical history on file.  SOCIAL HISTORY: Social History   Tobacco Use   Smoking status: Never Smoker   Smokeless tobacco: Never Used  Substance Use Topics   Alcohol use: No    Alcohol/week: 0.0 standard drinks   Drug use: No    FAMILY HISTORY: Family History  Problem Relation Age of Onset   Hyperlipidemia Unknown    Hypertension Unknown    Stroke Unknown    Hypertension Mother    Hyperlipidemia Mother    Obesity Mother    Sudden death Father     ROS: Review of Systems  Constitutional: Positive for weight loss.  Cardiovascular: Negative for chest pain.  Gastrointestinal: Negative for nausea and vomiting.  Genitourinary: Negative for frequency.  Musculoskeletal:       Negative for muscle weakness  Endo/Heme/Allergies:  Negative for polydipsia.       Negative for polyphagia    PHYSICAL EXAM: Blood pressure 138/82, pulse 74, temperature 98.2 F (36.8 C), temperature source Oral, height  (1.753 m), weight 286 lb (129.7 kg), SpO2 94 %. Body mass index is 42.23 kg/m. Physical Exam Vitals signs reviewed.  Constitutional:      Appearance: Normal appearance. He is well-developed. He is obese.  Cardiovascular:     Rate and Rhythm: Normal rate.  Pulmonary:     Effort: Pulmonary effort is normal.  Musculoskeletal: Normal range of motion.  Skin:    General: Skin is warm and dry.  Neurological:     Mental Status: He is alert and oriented to person, place, and time.  Psychiatric:        Mood and Affect: Mood normal.        Behavior: Behavior normal.     RECENT LABS AND TESTS: BMET    Component Value Date/Time   NA 147 (H) 09/20/2019 0928   K 4.4 09/20/2019 0928   CL 110 (H)  09/20/2019 0928   CO2 26 09/20/2019 0928   GLUCOSE 83 09/20/2019 0928   GLUCOSE 103 (H) 11/07/2015 1454   BUN 16 09/20/2019 0928   CREATININE 1.34 (H) 09/20/2019 0928   CALCIUM 9.4 09/20/2019 0928   GFRNONAA 63 09/20/2019 0928   GFRAA 73 09/20/2019 0928   Lab Results  Component Value Date   HGBA1C 5.5 09/20/2019   HGBA1C 5.3 01/19/2019   HGBA1C 5.4 08/05/2018   HGBA1C 5.4 11/11/2017   HGBA1C 5.3 08/06/2017   Lab Results  Component Value Date   INSULIN WILL FOLLOW 09/20/2019   INSULIN 23.1 01/19/2019   INSULIN 21.7 08/05/2018   INSULIN 23.2 11/11/2017   INSULIN 27.6 (H) 08/06/2017   CBC    Component Value Date/Time   WBC 6.6 12/24/2016 1021   WBC 10.2 11/07/2015 1454   RBC 5.47 12/24/2016 1021   RBC 5.67 11/07/2015 1454   HGB 15.2 12/24/2016 1021   HCT 44.3 12/24/2016 1021   PLT 300.0 11/07/2015 1454   MCV 81 12/24/2016 1021   MCH 27.8 12/24/2016 1021   MCH 28.0 07/18/2012 2030   MCHC 34.3 12/24/2016 1021   MCHC 33.3 11/07/2015 1454   RDW 14.6 12/24/2016 1021   LYMPHSABS 2.7 12/24/2016 1021   MONOABS 0.9 11/07/2015 1454   EOSABS 0.2 12/24/2016 1021   BASOSABS 0.1 12/24/2016 1021   Iron/TIBC/Ferritin/ %Sat No results found for: IRON, TIBC, FERRITIN, IRONPCTSAT Lipid Panel     Component Value Date/Time   CHOL 197 09/20/2019 0928   TRIG 92 09/20/2019 0928   HDL 42 09/20/2019 0928   CHOLHDL 5 03/17/2015 0841   VLDL 22.8 03/17/2015 0841   LDLCALC 138 (H) 09/20/2019 0928   Hepatic Function Panel     Component Value Date/Time   PROT 7.7 09/20/2019 0928   ALBUMIN 4.4 09/20/2019 0928   AST 14 09/20/2019 0928   ALT 20 09/20/2019 0928   ALKPHOS 74 09/20/2019 0928   BILITOT 0.3 09/20/2019 0928   BILIDIR 0.1 11/07/2015 1454      Component Value Date/Time   TSH 2.27 09/02/2018 0913   TSH 1.370 12/24/2016 1021   TSH 1.39 03/17/2015 0841     Ref. Range 01/19/2019 10:36  Vitamin D, 25-Hydroxy Latest Ref Range: 30.0 - 100.0 ng/mL 58.1    OBESITY BEHAVIORAL  INTERVENTION VISIT  Today's visit was # 52  Starting weight: 309 lbs Starting date: 12/24/2016 Today's weight : 286  lbs Today's date: 09/20/2019 Total lbs lost to date: 23    09/20/2019  Height 5\' 9"  (1.753 m)  Weight 286 lb (129.7 kg)  BMI (Calculated) 42.22  BLOOD PRESSURE - SYSTOLIC 935  BLOOD PRESSURE - DIASTOLIC 82   Body Fat % 70.1 %  Total Body Water (lbs) 121.8 lbs    ASK: We discussed the diagnosis of obesity with Daniel [name] today and Daniel [name] agreed to give [name] permission to discuss obesity behavioral modification therapy today.  ASSESS: Daniel [name] has the diagnosis of obesity and his BMI today is 42.22 Daniel [name] is in the action stage of change   ADVISE: Daniel [name] was educated on the multiple health risks of obesity as well as the benefit of weight loss to improve his health. He was advised of the need for long term treatment and the importance of lifestyle modifications to improve his current health and to decrease his risk of future health problems.  AGREE: Multiple dietary modification options and treatment options were discussed and  Daniel [name] agreed to follow the recommendations documented in the above note.  ARRANGE: Daniel [name] was educated on the importance of frequent visits to treat obesity as outlined per CMS and USPSTF guidelines and agreed to schedule his next follow up appointment today.  Daniel [name], am acting as transcriptionist for Abby Potash, PA-C I, Abby Potash, PA-C have reviewed above note and agree with its content

## 2019-10-08 DIAGNOSIS — H524 Presbyopia: Secondary | ICD-10-CM | POA: Diagnosis not present

## 2019-10-12 ENCOUNTER — Encounter (INDEPENDENT_AMBULATORY_CARE_PROVIDER_SITE_OTHER): Payer: Self-pay | Admitting: Physician Assistant

## 2019-10-13 ENCOUNTER — Telehealth (INDEPENDENT_AMBULATORY_CARE_PROVIDER_SITE_OTHER): Payer: 59 | Admitting: Family Medicine

## 2019-10-13 ENCOUNTER — Other Ambulatory Visit: Payer: Self-pay

## 2019-10-13 ENCOUNTER — Encounter (INDEPENDENT_AMBULATORY_CARE_PROVIDER_SITE_OTHER): Payer: Self-pay | Admitting: Family Medicine

## 2019-10-13 DIAGNOSIS — I10 Essential (primary) hypertension: Secondary | ICD-10-CM | POA: Diagnosis not present

## 2019-10-13 DIAGNOSIS — Z6841 Body Mass Index (BMI) 40.0 and over, adult: Secondary | ICD-10-CM

## 2019-10-13 DIAGNOSIS — F3289 Other specified depressive episodes: Secondary | ICD-10-CM | POA: Diagnosis not present

## 2019-10-13 MED ORDER — CHLORTHALIDONE 25 MG PO TABS: 25.0000 mg | ORAL_TABLET | Freq: Every day | ORAL | 0 refills | Status: DC

## 2019-10-13 MED ORDER — DILTIAZEM HCL ER COATED BEADS 240 MG PO TB24: 240.0000 mg | ORAL_TABLET | Freq: Every day | ORAL | 0 refills | Status: DC

## 2019-10-13 MED ORDER — BUPROPION HCL ER (SR) 200 MG PO TB12: 200.0000 mg | ORAL_TABLET | Freq: Every day | ORAL | 0 refills | Status: DC

## 2019-10-13 MED FILL — CHLORTHALIDONE 25 MG TABS: 25 | 30 days supply | Qty: 30 | Fill #0

## 2019-10-13 MED FILL — DILTIAZEM ER 240 MG TABLET: 240 | 30 days supply | Qty: 30 | Fill #0

## 2019-10-13 MED FILL — BUPROPION HCL SR 200 MG TAB: 200 | 30 days supply | Qty: 30 | Fill #0

## 2019-10-13 NOTE — Telephone Encounter (Signed)
Please reschedule pt has 10 am appt today.

## 2019-10-18 NOTE — Progress Notes (Signed)
Office: [phone]  /  Fax: [phone] TeleHealth Visit:  Daniel [name] has verbally consented to this TeleHealth visit today. The patient is located at work, the provider is located at the [facility] office. The participants in this visit include the listed provider and patient and any and all parties involved. The visit was conducted today via WebEx.  HPI:   Chief Complaint: OBESITY Daniel [name] is here to discuss his progress with his obesity treatment plan. He is on Category 3 plan and is following his eating plan approximately 55 % of the time. He states he is exercising 0 minutes 0 times per week. Daniel [name] reports he has maintained his weight. He does report skipping meals, especially breakfast. He does not always get his snack calories in. We were unable to weigh the patient today for this TeleHealth visit. He feels as if he has maintained weight since his last visit (weight not reported). He has lost 23 lbs since starting treatment with us. His weight has been plateaued since March 2020.   Hypertension Daniel [name] is a 48 y.o. male with hypertension. Daniel [name] denies chest pain or shortness of breath on exertion. He is working weight loss to help control his blood pressure with the goal of decreasing his risk of heart attack and stroke. Daniel [name] HTN was well controlled at most recent check but he has also had some high readings. BP Readings from Last 3 Encounters:  09/20/19 138/82  09/01/19 (!) 147/79  08/06/19 (!) 160/104     Depression with emotional eating behaviors He reports bupropion helps his with focus. His mood is stable and he feels the bupropion helps with food cravings. He has been working on behavior modification techniques to help reduce his emotional eating and has been somewhat successful. He shows no sign of suicidal or homicidal ideations.  ASSESSMENT AND PLAN:  Essential hypertension - Plan: diltiazem (CARDIZEM LA) 240 MG 24 hr tablet,  chlorthalidone (HYGROTON) 25 MG tablet  Other depression - with emotional eating - Plan: buPROPion (WELLBUTRIN SR) 200 MG 12 hr tablet  Class 3 severe obesity with serious comorbidity and body mass index (BMI) of 40.0 to 44.9 in adult, unspecified obesity type (HCC)  PLAN:  Hypertension We discussed sodium restriction, working on healthy weight loss, and a regular exercise program as the means to achieve improved blood pressure control. Daniel [name] agreed with this plan and agreed to follow up as directed. We will continue to monitor his blood pressure as well as his progress with the above lifestyle modifications. Daniel [name] agrees to continue Cardizem 240 mg daily #30 with no refills and Chlorthalidone 25 mg daily #30 with no refills and he will watch for signs of hypotension as he continues his lifestyle modifications.  Depression with Emotional Eating Behaviors We discussed behavior modification techniques today to help Daniel [name] deal with his emotional eating and depression. He has agreed to take continue Bupropion 200 mg qAM #30 with no refills and follow up as directed.  Obesity Daniel [name] is currently in the action stage of change. As such, his goal is to continue with weight loss efforts He has agreed to keep a food journal with 250 to 350 calories and 25 grams of protein at breakfast and follow the Category 3 plan Daniel [name] will get back to the gym. We discussed the following Behavioral Modification Strategies today: planning for success, no skipping meals and increasing lean protein intake Handouts were sent to patient via MyChart for egg muffins and breakfast  options  Daniel [name] has agreed to follow up with our clinic in 3 weeks. He was informed of the importance of frequent follow up visits to maximize his success with intensive lifestyle modifications for his multiple health conditions.  ALLERGIES: Allergies  Allergen Reactions  . Bee Venom Anaphylaxis  . Shellfish Allergy Anaphylaxis     MEDICATIONS: Current Outpatient Medications on File Prior to Visit  Medication Sig Dispense Refill  . albuterol (VENTOLIN HFA) 108 (90 Base) MCG/ACT inhaler INHALE 2 PUFFS INTO THE LUNGS EVERY 4 HOURS AS NEEDED FOR WHEEZING OR SHORTNESS OF BREATH. 18 g 5  . EPINEPHrine (EPIPEN 2-PAK) 0.3 mg/0.3 mL SOAJ injection Inject 0.3 mLs (0.3 mg total) into the muscle once. 1 Device 11  . etodolac (LODINE) 400 MG tablet Take 1 tablet (400 mg total) by mouth 2 (two) times daily as needed. 60 tablet 3  . levocetirizine (XYZAL) 5 MG tablet Take 1 tablet (5 mg total) by mouth every evening. 90 tablet 3  . montelukast (SINGULAIR) 10 MG tablet Take 1 tablet (10 mg total) by mouth at bedtime. 90 tablet 3  . Multiple Vitamin (MULTIVITAMIN WITH MINERALS) TABS Take 1 tablet by mouth daily.    . polyethylene glycol powder (GLYCOLAX/MIRALAX) powder Take 17 g by mouth daily as needed. 3350 g 1  . Polyvinyl Alcohol-Povidone (CLEAR EYES ALL SEASONS) 5-6 MG/ML SOLN Apply 1 drop to eye once as needed. Reported on 02/01/2016     No current facility-administered medications on file prior to visit.     PAST MEDICAL HISTORY: Past Medical History:  Diagnosis Date  . Asthma   . GERD (gastroesophageal reflux disease)   . Hypertension     PAST SURGICAL HISTORY: History reviewed. No pertinent surgical history.  SOCIAL HISTORY: Social History   Tobacco Use  . Smoking status: Never Smoker  . Smokeless tobacco: Never Used  Substance Use Topics  . Alcohol use: No    Alcohol/week: 0.0 standard drinks  . Drug use: No    FAMILY HISTORY: Family History  Problem Relation Age of Onset  . Hyperlipidemia Unknown   . Hypertension Unknown   . Stroke Unknown   . Hypertension Mother   . Hyperlipidemia Mother   . Obesity Mother   . Sudden death Father     ROS: Review of Systems  Constitutional: Negative for weight loss.  Respiratory: Negative for shortness of breath (on exertion).   Cardiovascular: Negative for chest  pain.  Endo/Heme/Allergies:       Positive for cravings  Psychiatric/Behavioral: Positive for depression. Negative for suicidal ideas.    PHYSICAL EXAM: Pt in no acute distress  RECENT LABS AND TESTS: BMET    Component Value Date/Time   NA 147 (H) 09/20/2019 0928   K 4.4 09/20/2019 0928   CL 110 (H) 09/20/2019 0928   CO2 26 09/20/2019 0928   GLUCOSE 83 09/20/2019 0928   GLUCOSE 103 (H) 11/07/2015 1454   BUN 16 09/20/2019 0928   CREATININE 1.34 (H) 09/20/2019 0928   CALCIUM 9.4 09/20/2019 0928   GFRNONAA 63 09/20/2019 0928   GFRAA 73 09/20/2019 0928   Lab Results  Component Value Date   HGBA1C 5.5 09/20/2019   HGBA1C 5.3 01/19/2019   HGBA1C 5.4 08/05/2018   HGBA1C 5.4 11/11/2017   HGBA1C 5.3 08/06/2017   Lab Results  Component Value Date   INSULIN 24.1 09/20/2019   INSULIN 23.1 01/19/2019   INSULIN 21.7 08/05/2018   INSULIN 23.2 11/11/2017   INSULIN 27.6 (H) 08/06/2017  CBC    Component Value Date/Time   WBC 6.6 12/24/2016 1021   WBC 10.2 11/07/2015 1454   RBC 5.47 12/24/2016 1021   RBC 5.67 11/07/2015 1454   HGB 15.2 12/24/2016 1021   HCT 44.3 12/24/2016 1021   PLT 300.0 11/07/2015 1454   MCV 81 12/24/2016 1021   MCH 27.8 12/24/2016 1021   MCH 28.0 07/18/2012 2030   MCHC 34.3 12/24/2016 1021   MCHC 33.3 11/07/2015 1454   RDW 14.6 12/24/2016 1021   LYMPHSABS 2.7 12/24/2016 1021   MONOABS 0.9 11/07/2015 1454   EOSABS 0.2 12/24/2016 1021   BASOSABS 0.1 12/24/2016 1021   Iron/TIBC/Ferritin/ %Sat No results found for: IRON, TIBC, FERRITIN, IRONPCTSAT Lipid Panel     Component Value Date/Time   CHOL 197 09/20/2019 0928   TRIG 92 09/20/2019 0928   HDL 42 09/20/2019 0928   CHOLHDL 5 03/17/2015 0841   VLDL 22.8 03/17/2015 0841   LDLCALC 138 (H) 09/20/2019 0928   Hepatic Function Panel     Component Value Date/Time   PROT 7.7 09/20/2019 0928   ALBUMIN 4.4 09/20/2019 0928   AST 14 09/20/2019 0928   ALT 20 09/20/2019 0928   ALKPHOS 74 09/20/2019  0928   BILITOT 0.3 09/20/2019 0928   BILIDIR 0.1 11/07/2015 1454      Component Value Date/Time   TSH 2.27 09/02/2018 0913   TSH 1.370 12/24/2016 1021   TSH 1.39 03/17/2015 0841     Ref. Range 09/20/2019 09:28  Vitamin D, 25-Hydroxy Latest Ref Range: 30.0 - 100.0 ng/mL 58.9    I, Doreene Nest, am acting as Location manager for [employer], FNP-C.  I have reviewed the above documentation for accuracy and completeness, and I agree with the above.  - Olia Hinderliter, FNP-C.

## 2019-11-02 ENCOUNTER — Ambulatory Visit (INDEPENDENT_AMBULATORY_CARE_PROVIDER_SITE_OTHER): Payer: Self-pay | Admitting: Physician Assistant

## 2019-11-12 ENCOUNTER — Other Ambulatory Visit: Payer: Self-pay | Admitting: Family Medicine

## 2019-11-12 MED FILL — LEVOCETIRIZINE 5 MG TABLET: 5 | 90 days supply | Qty: 90 | Fill #0

## 2019-11-12 MED FILL — MONTELUKAST SOD 10 MG TAB: 10 | 90 days supply | Qty: 90 | Fill #0

## 2019-12-07 ENCOUNTER — Encounter: Payer: Self-pay | Admitting: Family Medicine

## 2019-12-07 NOTE — Telephone Encounter (Signed)
I think EVERYONE should get the vaccine

## 2020-02-11 ENCOUNTER — Other Ambulatory Visit: Payer: Self-pay | Admitting: Family Medicine

## 2020-02-11 MED FILL — MONTELUKAST SOD 10 MG TAB: 10 | 90 days supply | Qty: 90 | Fill #0

## 2020-02-11 MED FILL — LEVOCETIRIZINE DIHYDROCHLOR: 5 | 90 days supply | Qty: 90 | Fill #0

## 2020-02-22 ENCOUNTER — Telehealth (INDEPENDENT_AMBULATORY_CARE_PROVIDER_SITE_OTHER): Payer: Self-pay

## 2020-02-22 NOTE — Telephone Encounter (Signed)
Patient wants to cancel his Thursday appt and reschedule. Please return the call at [phone].   Aidian Salomon, CMA

## 2020-02-24 ENCOUNTER — Ambulatory Visit (INDEPENDENT_AMBULATORY_CARE_PROVIDER_SITE_OTHER): Payer: 59 | Admitting: Physician Assistant

## 2020-03-08 ENCOUNTER — Ambulatory Visit (INDEPENDENT_AMBULATORY_CARE_PROVIDER_SITE_OTHER): Payer: 59 | Admitting: Physician Assistant

## 2020-03-08 ENCOUNTER — Other Ambulatory Visit: Payer: Self-pay

## 2020-03-08 ENCOUNTER — Encounter (INDEPENDENT_AMBULATORY_CARE_PROVIDER_SITE_OTHER): Payer: Self-pay | Admitting: Physician Assistant

## 2020-03-08 VITALS — BP 151/80 | HR 68 | Temp 98.3°F | Ht 69.0 in | Wt 307.0 lb

## 2020-03-08 DIAGNOSIS — Z6841 Body Mass Index (BMI) 40.0 and over, adult: Secondary | ICD-10-CM | POA: Diagnosis not present

## 2020-03-08 DIAGNOSIS — Z9189 Other specified personal risk factors, not elsewhere classified: Secondary | ICD-10-CM

## 2020-03-08 DIAGNOSIS — I10 Essential (primary) hypertension: Secondary | ICD-10-CM

## 2020-03-08 DIAGNOSIS — F3289 Other specified depressive episodes: Secondary | ICD-10-CM

## 2020-03-08 MED ORDER — DILTIAZEM HCL ER COATED BEADS 240 MG PO TB24: 240.0000 mg | ORAL_TABLET | Freq: Every day | ORAL | 0 refills | Status: DC

## 2020-03-08 MED ORDER — BUPROPION HCL ER (SR) 200 MG PO TB12: 200.0000 mg | ORAL_TABLET | Freq: Every day | ORAL | 0 refills | Status: DC

## 2020-03-08 MED FILL — BUPROPION HCL SR 200 MG TAB: 200 | 30 days supply | Qty: 30 | Fill #0

## 2020-03-08 MED FILL — DILTIAZEM ER 240 MG TABLET: 240 | 30 days supply | Qty: 30 | Fill #0

## 2020-03-08 NOTE — Progress Notes (Signed)
Chief Complaint:   OBESITY Daniel [name] is here to discuss his progress with his obesity treatment plan along with follow-up of his obesity related diagnoses. Daniel [name] is on the Category 3 Plan and states he is following his eating plan approximately 10% of the time. Daniel [name] states he is using the elliptical and weight lifting for 30 minutes 4 times per week.  Today's visit was #: 52 Starting weight: 309 lbs Starting date: 12/24/2016 Today's weight: 307 lbs Today's date: 03/08/2020 Total lbs lost to date: 2 lbs Total lbs lost since last in-office visit: 21 lbs  Interim History: Daniel [name] was off plan for 4 months but got back on plan last month and lost 10 pounds according to his scale.  He is ready to restart Category 3.  Subjective:   1. Essential hypertension [name] has not taken his blood pressure medications in 6 months due to running out.  No headache or chest pain.    2. Other depression He has not taken bupropion in 6 months.  No SI/HI.  3. At risk for hypertension The patient is at a higher than average risk of hypertension due to not taking his blood pressure medication.  Assessment/Plan:   1. Essential hypertension [name] is working on healthy weight loss and exercise to improve blood pressure control. We will watch for signs of hypotension as he continues his lifestyle modifications. - diltiazem (CARDIZEM LA) 240 MG 24 hr tablet; Take 1 tablet (240 mg total) by mouth daily.  Dispense: 30 tablet; Refill: 0  2. Other depression Behavior modification techniques were discussed today to help Daniel [name] deal with his emotional/non-hunger eating behaviors.  Orders and follow up as documented in patient record.  - buPROPion (WELLBUTRIN SR) 200 MG 12 hr tablet; Take 1 tablet (200 mg total) by mouth daily.  Dispense: 30 tablet; Refill: 0  3. At risk for hypertension [name] was given approximately 15 minutes of hypertension prevention counseling today. [name] is at risk for  hypertension due to obesity. We discussed intensive lifestyle modifications today with an emphasis on weight loss as well as increasing exercise and decreasing salt intake.  Repetitive spaced learning was employed today to elicit superior memory formation and behavioral change.  4. Class 3 severe obesity with serious comorbidity and body mass index (BMI) of 45.0 to 49.9 in adult, unspecified obesity type (HCC) Daniel [name] is currently in the action stage of change. As such, his goal is to continue with weight loss efforts. He has agreed to the Category 3 Plan.   Exercise goals: As is.  Behavioral modification strategies: meal planning and cooking strategies and keeping healthy foods in the home.  Daniel [name] has agreed to follow-up with our clinic in 2 weeks. He was informed of the importance of frequent follow-up visits to maximize his success with intensive lifestyle modifications for his multiple health conditions.   Objective:   Blood pressure (!) 151/80, pulse 68, temperature 98.3 F (36.8 C), temperature source Oral, height 5\' 9"  (1.753 m), weight (!) 307 lb (139.3 kg), SpO2 96 %. Body mass index is 45.34 kg/m.  General: Cooperative, alert, well developed, in no acute distress. HEENT: Conjunctivae and lids unremarkable. Cardiovascular: Regular rhythm.  Lungs: Normal work of breathing. Neurologic: No focal deficits.   Lab Results  Component Value Date   CREATININE 1.34 (H) 09/20/2019   BUN 16 09/20/2019   NA 147 (H) 09/20/2019   K 4.4 09/20/2019   CL 110 (H) 09/20/2019   CO2 26 09/20/2019  Lab Results  Component Value Date   ALT 20 09/20/2019   AST 14 09/20/2019   ALKPHOS 74 09/20/2019   BILITOT 0.3 09/20/2019   Lab Results  Component Value Date   HGBA1C 5.5 09/20/2019   HGBA1C 5.3 01/19/2019   HGBA1C 5.4 08/05/2018   HGBA1C 5.4 11/11/2017   HGBA1C 5.3 08/06/2017   Lab Results  Component Value Date   INSULIN 24.1 09/20/2019   INSULIN 23.1 01/19/2019   INSULIN 21.7  08/05/2018   INSULIN 23.2 11/11/2017   INSULIN 27.6 (H) 08/06/2017   Lab Results  Component Value Date   TSH 2.27 09/02/2018   Lab Results  Component Value Date   CHOL 197 09/20/2019   HDL 42 09/20/2019   LDLCALC 138 (H) 09/20/2019   TRIG 92 09/20/2019   CHOLHDL 5 03/17/2015   Lab Results  Component Value Date   WBC 6.6 12/24/2016   HGB 15.2 12/24/2016   HCT 44.3 12/24/2016   MCV 81 12/24/2016   PLT 300.0 11/07/2015   Attestation Statements:   Reviewed by clinician on day of visit: allergies, medications, problem list, medical history, surgical history, family history, social history, and previous encounter notes.  I, Insurance claims handler, CMA, am acting as Energy manager for [employer], PA-C.  I have reviewed the above documentation for accuracy and completeness, and I agree with the above. Alois Cliche, PA-C

## 2020-03-10 MED FILL — CHLORTHALIDONE 25 MG TABS: 25 | 30 days supply | Qty: 30 | Fill #1

## 2020-03-23 ENCOUNTER — Encounter (INDEPENDENT_AMBULATORY_CARE_PROVIDER_SITE_OTHER): Payer: Self-pay | Admitting: Physician Assistant

## 2020-03-23 ENCOUNTER — Other Ambulatory Visit: Payer: Self-pay

## 2020-03-23 ENCOUNTER — Ambulatory Visit (INDEPENDENT_AMBULATORY_CARE_PROVIDER_SITE_OTHER): Payer: 59 | Admitting: Physician Assistant

## 2020-03-23 VITALS — BP 125/84 | HR 83 | Temp 98.1°F | Ht 69.0 in | Wt 300.0 lb

## 2020-03-23 DIAGNOSIS — Z6841 Body Mass Index (BMI) 40.0 and over, adult: Secondary | ICD-10-CM

## 2020-03-23 DIAGNOSIS — R7303 Prediabetes: Secondary | ICD-10-CM

## 2020-03-23 NOTE — Progress Notes (Signed)
Chief Complaint:   Daniel [name] is here to discuss his progress with his Daniel treatment plan along with follow-up of his Daniel related diagnoses. [name] is on the Category 3 Plan and states he is following his eating plan approximately 70% of the time. Daniel [name] states he is doing cardio/weight training 90 minutes 4 times per week.  Today's visit was #: 72 Starting weight: 309 lbs Starting date: 12/24/2016 Today's weight: 300 lbs Today's date: 03/23/2020 Total lbs lost to date: 9 Total lbs lost since last in-office visit: 7  Interim History: Daniel [name] states that he is back to following the plan much more closely and has not been skipping meals.  Subjective:   Prediabetes. [name] has a diagnosis of prediabetes based on his elevated HgA1c and was informed this puts him at greater risk of developing diabetes. He continues to work on diet and exercise to decrease his risk of diabetes. He denies nausea or hypoglycemia. [name] is on no medication. No polyphagia. He is exercising regularly.  Lab Results  Component Value Date   HGBA1C 5.5 09/20/2019   Lab Results  Component Value Date   INSULIN 24.1 09/20/2019   INSULIN 23.1 01/19/2019   INSULIN 21.7 08/05/2018   INSULIN 23.2 11/11/2017   INSULIN 27.6 (H) 08/06/2017   Assessment/Plan:   Prediabetes. Daniel [name] will continue to work on weight loss, exercise, and decreasing simple carbohydrates to help decrease the risk of diabetes.   Class 3 severe Daniel with serious comorbidity and body mass index (BMI) of 40.0 to 44.9 in adult, unspecified Daniel type ([place]).  Daniel [name] is currently in the action stage of change. As such, his goal is to continue with weight loss efforts. He has agreed to the Category 3 Plan.   Exercise goals: For substantial health benefits, adults should do at least 150 minutes (2 hours and 30 minutes) a week of moderate-intensity, or 75 minutes (1 hour and 15 minutes) a week of vigorous-intensity aerobic  physical activity, or an equivalent combination of moderate- and vigorous-intensity aerobic activity. Aerobic activity should be performed in episodes of at least 10 minutes, and preferably, it should be spread throughout the week.  Behavioral modification strategies: keeping healthy foods in the home and planning for success.  Daniel [name] has agreed to follow-up with our clinic in 2 weeks. He was informed of the importance of frequent follow-up visits to maximize his success with intensive lifestyle modifications for his multiple health conditions.   Objective:   Blood pressure 125/84, pulse 83, temperature 98.1 F (36.7 C), temperature source Oral, height 5\' 9"  (1.753 m), weight 300 lb (136.1 kg), SpO2 96 %. Body mass index is 44.3 kg/m.  General: Cooperative, alert, well developed, in no acute distress. HEENT: Conjunctivae and lids unremarkable. Cardiovascular: Regular rhythm.  Lungs: Normal work of breathing. Neurologic: No focal deficits.   Lab Results  Component Value Date   CREATININE 1.34 (H) 09/20/2019   BUN 16 09/20/2019   NA 147 (H) 09/20/2019   K 4.4 09/20/2019   CL 110 (H) 09/20/2019   CO2 26 09/20/2019   Lab Results  Component Value Date   ALT 20 09/20/2019   AST 14 09/20/2019   ALKPHOS 74 09/20/2019   BILITOT 0.3 09/20/2019   Lab Results  Component Value Date   HGBA1C 5.5 09/20/2019   HGBA1C 5.3 01/19/2019   HGBA1C 5.4 08/05/2018   HGBA1C 5.4 11/11/2017   HGBA1C 5.3 08/06/2017   Lab Results  Component Value Date   INSULIN  24.1 09/20/2019   INSULIN 23.1 01/19/2019   INSULIN 21.7 08/05/2018   INSULIN 23.2 11/11/2017   INSULIN 27.6 (H) 08/06/2017   Lab Results  Component Value Date   TSH 2.27 09/02/2018   Lab Results  Component Value Date   CHOL 197 09/20/2019   HDL 42 09/20/2019   LDLCALC 138 (H) 09/20/2019   TRIG 92 09/20/2019   CHOLHDL 5 03/17/2015   Lab Results  Component Value Date   WBC 6.6 12/24/2016   HGB 15.2 12/24/2016   HCT 44.3  12/24/2016   MCV 81 12/24/2016   PLT 300.0 11/07/2015   No results found for: IRON, TIBC, FERRITIN  Attestation Statements:   Reviewed by clinician on day of visit: allergies, medications, problem list, medical history, surgical history, family history, social history, and previous encounter notes.  Time spent on visit including pre-visit chart review and post-visit charting and care was 32 minutes.   IMarianna Payment, am acting as transcriptionist for Alois Cliche, PA-C   I have reviewed the above documentation for accuracy and completeness, and I agree with the above. Alois Cliche, PA-C

## 2020-04-06 ENCOUNTER — Ambulatory Visit (INDEPENDENT_AMBULATORY_CARE_PROVIDER_SITE_OTHER): Payer: 59 | Admitting: Physician Assistant

## 2020-04-20 ENCOUNTER — Ambulatory Visit (INDEPENDENT_AMBULATORY_CARE_PROVIDER_SITE_OTHER): Payer: 59 | Admitting: Physician Assistant

## 2020-04-20 ENCOUNTER — Other Ambulatory Visit: Payer: Self-pay

## 2020-04-20 ENCOUNTER — Encounter (INDEPENDENT_AMBULATORY_CARE_PROVIDER_SITE_OTHER): Payer: Self-pay | Admitting: Physician Assistant

## 2020-04-20 VITALS — BP 133/84 | HR 85 | Temp 98.0°F | Ht 69.0 in | Wt 298.0 lb

## 2020-04-20 DIAGNOSIS — F3289 Other specified depressive episodes: Secondary | ICD-10-CM | POA: Diagnosis not present

## 2020-04-20 DIAGNOSIS — Z9189 Other specified personal risk factors, not elsewhere classified: Secondary | ICD-10-CM | POA: Diagnosis not present

## 2020-04-20 DIAGNOSIS — Z6841 Body Mass Index (BMI) 40.0 and over, adult: Secondary | ICD-10-CM

## 2020-04-20 DIAGNOSIS — I10 Essential (primary) hypertension: Secondary | ICD-10-CM | POA: Diagnosis not present

## 2020-04-20 MED ORDER — CHLORTHALIDONE 25 MG PO TABS: 25.0000 mg | ORAL_TABLET | Freq: Every day | ORAL | 0 refills | Status: DC

## 2020-04-20 MED ORDER — DILTIAZEM HCL ER COATED BEADS 240 MG PO TB24: 240.0000 mg | ORAL_TABLET | Freq: Every day | ORAL | 0 refills | Status: DC

## 2020-04-20 MED ORDER — BUPROPION HCL ER (SR) 200 MG PO TB12: 200.0000 mg | ORAL_TABLET | Freq: Every day | ORAL | 0 refills | Status: DC

## 2020-04-20 MED FILL — BUPROPION HCL SR 200 MG TAB: 200 | 30 days supply | Qty: 30 | Fill #0

## 2020-04-20 MED FILL — DILTIAZEM ER 240 MG TABLET: 240 | 30 days supply | Qty: 30 | Fill #0

## 2020-04-20 MED FILL — CHLORTHALIDONE 25 MG TABS: 25 | 30 days supply | Qty: 30 | Fill #0

## 2020-04-20 NOTE — Progress Notes (Signed)
Chief Complaint:   Daniel [name] is here to discuss his progress with his Daniel treatment plan along with follow-up of his Daniel related diagnoses. Daniel [name] is on the Category 3 Plan and states he is following his eating plan approximately 80% of the time. Daniel [name] states he is doing cardio/strengthening 90 minutes 5 times per week.  Today's visit was #: 34 Starting weight: 309 lbs Starting date: 12/24/2016 Today's weight: 298 lbs Today's date: 04/20/2020 Total lbs lost to date: 11 Total lbs lost since last in-office visit: 2  Interim History: Daniel [name] states that he has been a "creature of habit" recently and is eating a lot of the same foods daily. He has been journaling.  Subjective:   Essential hypertension. Daniel [name] is on chlorthalidone and Diltiazem. No chest pain or headache.  BP Readings from Last 3 Encounters:  04/20/20 133/84  03/23/20 125/84  03/08/20 (!) 151/80   Lab Results  Component Value Date   CREATININE 1.34 (H) 09/20/2019   CREATININE 1.22 01/19/2019   CREATININE 1.20 08/05/2018   Other depression - with emotional eating. Daniel [name] is struggling with emotional eating and using food for comfort to the extent that it is negatively impacting his health. He has been working on behavior modification techniques to help reduce his emotional eating and has been somewhat successful. He shows no sign of suicidal or homicidal ideations. Blood pressure is normal.  At risk for heart disease. Daniel [name] is at a higher than average risk for cardiovascular disease due to Daniel.   Assessment/Plan:   Essential hypertension. Daniel [name] is working on healthy weight loss and exercise to improve blood pressure control. We will watch for signs of hypotension as he continues his lifestyle modifications. Refills were given for chlorthalidone (HYGROTON) 25 MG tablet #30 with 0 refills and diltiazem (CARDIZEM LA) 240 MG 24 hr tablet #30 with 0 refills.  Other depression - with  emotional eating. Behavior modification techniques were discussed today to help Daniel [name] deal with his emotional/non-hunger eating behaviors.  Orders and follow up as documented in patient record. Refill was given for buPROPion (WELLBUTRIN SR) 200 MG 12 hr tablet #30 with 0 refills.  At risk for heart disease. Daniel [name] was given approximately 15 minutes of coronary artery disease prevention counseling today. He is 49 y.o. male and has risk factors for heart disease including Daniel. We discussed intensive lifestyle modifications today with an emphasis on specific weight loss instructions and strategies.   Repetitive spaced learning was employed today to elicit superior memory formation and behavioral change.  Class 3 severe Daniel with serious comorbidity and body mass index (BMI) of 40.0 to 44.9 in adult, unspecified Daniel type (Daniel [name]).  Daniel [name] is currently in the action stage of change. As such, his goal is to continue with weight loss efforts. He has agreed to the Category 3 Plan.   Exercise goals: For substantial health benefits, adults should do at least 150 minutes (2 hours and 30 minutes) a week of moderate-intensity, or 75 minutes (1 hour and 15 minutes) a week of vigorous-intensity aerobic physical activity, or an equivalent combination of moderate- and vigorous-intensity aerobic activity. Aerobic activity should be performed in episodes of at least 10 minutes, and preferably, it should be spread throughout the week.  Behavioral modification strategies: meal planning and cooking strategies and keeping healthy foods in the home.  Daniel [name] has agreed to follow-up with our clinic in 4 weeks. He was informed of the importance of frequent follow-up visits to maximize his  success with intensive lifestyle modifications for his multiple health conditions.   Objective:   Blood pressure 133/84, pulse 85, temperature 98 F (36.7 C), temperature source Oral, height 5\' 9"  (1.753 m), weight 298 lb (135.2  kg), SpO2 97 %. Body mass index is 44.01 kg/m.  General: Cooperative, alert, well developed, in no acute distress. HEENT: Conjunctivae and lids unremarkable. Cardiovascular: Regular rhythm.  Lungs: Normal work of breathing. Neurologic: No focal deficits.   Lab Results  Component Value Date   CREATININE 1.34 (H) 09/20/2019   BUN 16 09/20/2019   NA 147 (H) 09/20/2019   K 4.4 09/20/2019   CL 110 (H) 09/20/2019   CO2 26 09/20/2019   Lab Results  Component Value Date   ALT 20 09/20/2019   AST 14 09/20/2019   ALKPHOS 74 09/20/2019   BILITOT 0.3 09/20/2019   Lab Results  Component Value Date   HGBA1C 5.5 09/20/2019   HGBA1C 5.3 01/19/2019   HGBA1C 5.4 08/05/2018   HGBA1C 5.4 11/11/2017   HGBA1C 5.3 08/06/2017   Lab Results  Component Value Date   INSULIN 24.1 09/20/2019   INSULIN 23.1 01/19/2019   INSULIN 21.7 08/05/2018   INSULIN 23.2 11/11/2017   INSULIN 27.6 (H) 08/06/2017   Lab Results  Component Value Date   TSH 2.27 09/02/2018   Lab Results  Component Value Date   CHOL 197 09/20/2019   HDL 42 09/20/2019   LDLCALC 138 (H) 09/20/2019   TRIG 92 09/20/2019   CHOLHDL 5 03/17/2015   Lab Results  Component Value Date   WBC 6.6 12/24/2016   HGB 15.2 12/24/2016   HCT 44.3 12/24/2016   MCV 81 12/24/2016   PLT 300.0 11/07/2015   No results found for: IRON, TIBC, FERRITIN  Attestation Statements:   Reviewed by clinician on day of visit: allergies, medications, problem list, medical history, surgical history, family history, social history, and previous encounter notes.  I12/15/2016, am acting as transcriptionist for Marianna Payment, PA-C   I have reviewed the above documentation for accuracy and completeness, and I agree with the above. Alois Cliche, PA-C

## 2020-05-08 ENCOUNTER — Encounter: Payer: Self-pay | Admitting: Family Medicine

## 2020-05-08 ENCOUNTER — Ambulatory Visit (INDEPENDENT_AMBULATORY_CARE_PROVIDER_SITE_OTHER): Payer: 59 | Admitting: Family Medicine

## 2020-05-08 ENCOUNTER — Ambulatory Visit: Payer: Self-pay

## 2020-05-08 ENCOUNTER — Other Ambulatory Visit: Payer: Self-pay

## 2020-05-08 DIAGNOSIS — M79672 Pain in left foot: Secondary | ICD-10-CM

## 2020-05-08 MED ORDER — COLCHICINE 0.6 MG PO CAPS
1.0000 | ORAL_CAPSULE | Freq: Two times a day (BID) | ORAL | 3 refills | Status: DC | PRN
Start: 2020-05-08 — End: 2020-05-09

## 2020-05-08 MED ORDER — METHYLPREDNISOLONE 4 MG PO TBPK: ORAL_TABLET | ORAL | 0 refills | Status: DC

## 2020-05-08 MED FILL — COLCHICINE 0.6 MG CAPSULE: 0.6 | 30 days supply | Qty: 60 | Fill #0

## 2020-05-08 MED FILL — METHYLPREDNISOLONE 4 MG TAB: 4 | 6 days supply | Qty: 21 | Fill #0

## 2020-05-08 NOTE — Progress Notes (Signed)
Patient c/o pain in his left foot. States he thought it was gout, but medication isn't helping. Pain has been about 5-6 days. No known injury. Some swelling. Has cam boot, that he states gives some relief. He states ibuprofen isn't helping him at all.

## 2020-05-08 NOTE — Progress Notes (Signed)
Office Visit Note   Patient: Daniel [name]           Date of Birth: Mar 17, 1971           MRN: [id number] Visit Date: 05/08/2020 Requested by: Laurey Morale, MD [place],  [place] [address] PCP: Laurey Morale, MD  Subjective: No chief complaint on file.   HPI: He is here with left foot pain.  Symptoms started a few days ago, no injury.  Prior to that he had a gout attack in his foot that seem to be getting better with ibuprofen.  The attack was in a different location.  Then he started having pain on the lateral foot with radiation to the back of the ankle and occasionally into the lateral calf.  No fevers or chills.  He had a fracture boot at home and that seemed to give him some relief.  He does not take anything preventative for gout.  He has had an attack in the past that resolved with cortisone injection per Dr. Paulla Dolly.              ROS:   All other systems were reviewed and are negative.  Objective: Vital Signs: There were no vitals taken for this visit.  Physical Exam:  General:  Alert and oriented, in no acute distress. Pulm:  Breathing unlabored. Psy:  Normal mood, congruent affect. Skin: No erythema Left foot: He is very tender to palpation near the proximal fifth metatarsal.  There is also some tenderness along the peroneal tendons.  He has pain with eversion of the foot against resistance.   Imaging: XR Foot Complete Left  Result Date: 05/08/2020 X-rays left foot: No sign of stress fracture.  He has some midfoot DJD and a traction spur at the plantar fascial origin.   Assessment & Plan: 1.  Left lateral foot pain, suspicious for gout. -We will try a Medrol Dosepak and colchicine.  If symptoms persist, then cortisone injection.     Procedures: No procedures performed  No notes on file     PMFS History: Patient Active Problem List   Diagnosis Date Noted  . Depression 01/06/2018  . Essential hypertension 08/11/2017  . Class 3 severe  obesity with serious comorbidity and body mass index (BMI) of 40.0 to 44.9 in adult ([place]) 08/11/2017  . Class 3 obesity without serious comorbidity with body mass index (BMI) of 40.0 to 44.9 in adult 06/30/2017  . Prediabetes 04/15/2017  . Other hyperlipidemia 03/25/2017  . Vitamin D deficiency 03/25/2017  . Insulin resistance 03/25/2017  . Hypertension 01/20/2017  . Class 3 obesity without serious comorbidity with body mass index (BMI) of 45.0 to 49.9 in adult 01/20/2017  . Morbid obesity ([place]) 12/24/2016  . Shortness of breath on exertion 12/24/2016  . Other fatigue 12/24/2016  . CN (constipation) 02/03/2015  . HTN (hypertension) 03/03/2013  . ABDOMINAL PAIN, LEFT UPPER QUADRANT 11/02/2010  . PLANTAR FASCIITIS 05/14/2010  . GASTRITIS 10/26/2008  . FEVER BLISTER 07/22/2008  . ASTHMA 07/22/2008  . GERD 07/22/2008   Past Medical History:  Diagnosis Date  . Asthma   . GERD (gastroesophageal reflux disease)   . Hypertension     Family History  Problem Relation Age of Onset  . Hyperlipidemia Unknown   . Hypertension Unknown   . Stroke Unknown   . Hypertension Mother   . Hyperlipidemia Mother   . Obesity Mother   . Sudden death Father     History reviewed.  No pertinent surgical history. Social History   Occupational History  . Occupation: Production assistant, radio: [employer]  Tobacco Use  . Smoking status: Never Smoker  . Smokeless tobacco: Never Used  Substance and Sexual Activity  . Alcohol use: No    Alcohol/week: 0.0 standard drinks  . Drug use: No  . Sexual activity: Yes    Birth control/protection: None

## 2020-05-09 ENCOUNTER — Encounter: Payer: Self-pay | Admitting: Family Medicine

## 2020-05-09 MED ORDER — COLCHICINE 0.6 MG PO CAPS
1.0000 | ORAL_CAPSULE | Freq: Two times a day (BID) | ORAL | 3 refills | Status: DC | PRN
Start: 2020-05-09 — End: 2024-08-19

## 2020-05-16 MED FILL — MONTELUKAST SOD 10 MG TAB: 10 | 90 days supply | Qty: 90 | Fill #1

## 2020-05-16 MED FILL — LEVOCETIRIZINE 5 MG TABLET: 5 | 90 days supply | Qty: 90 | Fill #1

## 2020-05-18 ENCOUNTER — Other Ambulatory Visit: Payer: Self-pay

## 2020-05-18 ENCOUNTER — Ambulatory Visit (INDEPENDENT_AMBULATORY_CARE_PROVIDER_SITE_OTHER): Payer: 59 | Admitting: Physician Assistant

## 2020-05-18 ENCOUNTER — Encounter (INDEPENDENT_AMBULATORY_CARE_PROVIDER_SITE_OTHER): Payer: Self-pay | Admitting: Physician Assistant

## 2020-05-18 VITALS — BP 145/73 | HR 66 | Temp 98.2°F | Ht 69.0 in | Wt 305.0 lb

## 2020-05-18 DIAGNOSIS — I10 Essential (primary) hypertension: Secondary | ICD-10-CM | POA: Diagnosis not present

## 2020-05-18 DIAGNOSIS — Z9189 Other specified personal risk factors, not elsewhere classified: Secondary | ICD-10-CM

## 2020-05-18 DIAGNOSIS — F3289 Other specified depressive episodes: Secondary | ICD-10-CM

## 2020-05-18 DIAGNOSIS — Z6841 Body Mass Index (BMI) 40.0 and over, adult: Secondary | ICD-10-CM | POA: Diagnosis not present

## 2020-05-18 MED ORDER — DILTIAZEM HCL ER COATED BEADS 240 MG PO TB24: 240.0000 mg | ORAL_TABLET | Freq: Every day | ORAL | 0 refills | Status: DC

## 2020-05-18 MED ORDER — BUPROPION HCL ER (SR) 200 MG PO TB12: 200.0000 mg | ORAL_TABLET | Freq: Every day | ORAL | 0 refills | Status: DC

## 2020-05-18 MED ORDER — CHLORTHALIDONE 25 MG PO TABS: 25.0000 mg | ORAL_TABLET | Freq: Every day | ORAL | 0 refills | Status: DC

## 2020-05-18 MED FILL — DILTIAZEM ER 240 MG TABLET: 240 | 30 days supply | Qty: 30 | Fill #0

## 2020-05-18 MED FILL — BUPROPION HCL SR 200 MG TAB: 200 | 30 days supply | Qty: 30 | Fill #0

## 2020-05-18 MED FILL — CHLORTHALIDONE 25 MG TABS: 25 | 30 days supply | Qty: 30 | Fill #0

## 2020-05-22 NOTE — Progress Notes (Signed)
Chief Complaint:   OBESITY Daniel [name] is here to discuss his progress with his obesity treatment plan along with follow-up of his obesity related diagnoses. Daniel [name] is on the Category 3 Plan and states he is following his eating plan approximately 50% of the time. Daniel [name] states he is doing cardio/weights 45 minutes 7 times per week.  Today's visit was #: 70 Starting weight: 309 lbs Starting date: 12/24/2016 Today's weight: 305 lbs Today's date: 05/18/2020 Total lbs lost to date: 4 Total lbs lost since last in-office visit: 0  Interim History: Daniel [name] states that he had an episode of gout and his PCP advised that he decrease his protein intake.  Subjective:   Essential hypertension. Itay is on chlorthalidone and Diltiazem. No  chest pain or headache.   BP Readings from Last 3 Encounters:  05/18/20 (!) 145/73  04/20/20 133/84  03/23/20 125/84   Lab Results  Component Value Date   CREATININE 1.34 (H) 09/20/2019   CREATININE 1.22 01/19/2019   CREATININE 1.20 08/05/2018   Other depression - with emotional eating. Daniel [name] is struggling with emotional eating and using food for comfort to the extent that it is negatively impacting his health. He has been working on behavior modification techniques to help reduce his emotional eating and has been somewhat successful. He shows no sign of suicidal or homicidal ideations on bupropion.  At risk for heart disease. Daniel [name] is at a higher than average risk for cardiovascular disease due to obesity.   Assessment/Plan:   Essential hypertension. [name] is working on healthy weight loss and exercise to improve blood pressure control. We will watch for signs of hypotension as he continues his lifestyle modifications. Refills were given for chlorthalidone (HYGROTON) 25 MG tablet #90 with 0 refills and diltiazem (CARDIZEM LA) 240 MG 24 hr tablet #90 with 0 refills.  Other depression - with emotional eating. Behavior modification techniques  were discussed today to help Daniel [name] deal with his emotional/non-hunger eating behaviors.  Orders and follow up as documented in patient record. Refill was given for buPROPion (WELLBUTRIN SR) 200 MG 12 hr tablet #30 with 0 refills.  At risk for heart disease. Daniel [name] was given approximately 15 minutes of coronary artery disease prevention counseling today. He is 49 y.o. male and has risk factors for heart disease including obesity. We discussed intensive lifestyle modifications today with an emphasis on specific weight loss instructions and strategies.   Repetitive spaced learning was employed today to elicit superior memory formation and behavioral change.  Class 3 severe obesity with serious comorbidity and body mass index (BMI) of 45.0 to 49.9 in adult, unspecified obesity type ([name]).  Daniel [name] is currently in the action stage of change. As such, his goal is to continue with weight loss efforts. He has agreed to the Category 3 Plan.   Exercise goals: For substantial health benefits, adults should do at least 150 minutes (2 hours and 30 minutes) a week of moderate-intensity, or 75 minutes (1 hour and 15 minutes) a week of vigorous-intensity aerobic physical activity, or an equivalent combination of moderate- and vigorous-intensity aerobic activity. Aerobic activity should be performed in episodes of at least 10 minutes, and preferably, it should be spread throughout the week.  Behavioral modification strategies: increasing lean protein intake and meal planning and cooking strategies.  Daniel [name] has agreed to follow-up with our clinic in 2 weeks. He was informed of the importance of frequent follow-up visits to maximize his success with intensive lifestyle modifications for his multiple  health conditions.   Objective:   Blood pressure (!) 145/73, pulse 66, temperature 98.2 F (36.8 C), temperature source Oral, height 5\' 9"  (1.753 m), weight (!) 305 lb (138.3 kg), SpO2 97 %. Body mass index is 45.04  kg/m.  General: Cooperative, alert, well developed, in no acute distress. HEENT: Conjunctivae and lids unremarkable. Cardiovascular: Regular rhythm.  Lungs: Normal work of breathing. Neurologic: No focal deficits.   Lab Results  Component Value Date   CREATININE 1.34 (H) 09/20/2019   BUN 16 09/20/2019   NA 147 (H) 09/20/2019   K 4.4 09/20/2019   CL 110 (H) 09/20/2019   CO2 26 09/20/2019   Lab Results  Component Value Date   ALT 20 09/20/2019   AST 14 09/20/2019   ALKPHOS 74 09/20/2019   BILITOT 0.3 09/20/2019   Lab Results  Component Value Date   HGBA1C 5.5 09/20/2019   HGBA1C 5.3 01/19/2019   HGBA1C 5.4 08/05/2018   HGBA1C 5.4 11/11/2017   HGBA1C 5.3 08/06/2017   Lab Results  Component Value Date   INSULIN 24.1 09/20/2019   INSULIN 23.1 01/19/2019   INSULIN 21.7 08/05/2018   INSULIN 23.2 11/11/2017   INSULIN 27.6 (H) 08/06/2017   Lab Results  Component Value Date   TSH 2.27 09/02/2018   Lab Results  Component Value Date   CHOL 197 09/20/2019   HDL 42 09/20/2019   LDLCALC 138 (H) 09/20/2019   TRIG 92 09/20/2019   CHOLHDL 5 03/17/2015   Lab Results  Component Value Date   WBC 6.6 12/24/2016   HGB 15.2 12/24/2016   HCT 44.3 12/24/2016   MCV 81 12/24/2016   PLT 300.0 11/07/2015   No results found for: IRON, TIBC, FERRITIN  Attestation Statements:   Reviewed by clinician on day of visit: allergies, medications, problem list, medical history, surgical history, family history, social history, and previous encounter notes.  I12/15/2016, am acting as transcriptionist for Marianna Payment, PA-C   I have reviewed the above documentation for accuracy and completeness, and I agree with the above. Alois Cliche, PA-C

## 2020-06-01 ENCOUNTER — Other Ambulatory Visit: Payer: Self-pay

## 2020-06-01 ENCOUNTER — Ambulatory Visit (INDEPENDENT_AMBULATORY_CARE_PROVIDER_SITE_OTHER): Payer: 59 | Admitting: Physician Assistant

## 2020-06-01 ENCOUNTER — Encounter (INDEPENDENT_AMBULATORY_CARE_PROVIDER_SITE_OTHER): Payer: Self-pay | Admitting: Physician Assistant

## 2020-06-01 VITALS — BP 129/78 | HR 70 | Temp 98.0°F | Ht 69.0 in | Wt 297.0 lb

## 2020-06-01 DIAGNOSIS — Z6841 Body Mass Index (BMI) 40.0 and over, adult: Secondary | ICD-10-CM | POA: Diagnosis not present

## 2020-06-01 DIAGNOSIS — R7303 Prediabetes: Secondary | ICD-10-CM | POA: Diagnosis not present

## 2020-06-01 MED FILL — CHLORTHALIDONE 25 MG TABS: 25 | 30 days supply | Qty: 30 | Fill #0

## 2020-06-01 MED FILL — BUPROPION HCL SR 200 MG TAB: 200 | 30 days supply | Qty: 30 | Fill #0

## 2020-06-01 MED FILL — DILTIAZEM ER 240 MG TABLET: 240 | 30 days supply | Qty: 30 | Fill #0

## 2020-06-05 NOTE — Progress Notes (Signed)
Chief Complaint:   OBESITY Daniel [name] is here to discuss his progress with his obesity treatment plan along with follow-up of his obesity related diagnoses. Gaylon is on the Category 3 Plan and states he is following his eating plan approximately 90% of the time. Alim states he is resistance training 90 minutes 4 times per week and cardio 45 minutes 2 times per week.  Today's visit was #: 58 Starting weight: 309 lbs Starting date: 12/24/2016 Today's weight: 297 lbs Today's date: 06/01/2020 Total lbs lost to date: 12 Total lbs lost since last in-office visit: 8  Interim History: Revin states that he stayed on his plan daily except for the July 4th holiday. He reports being hungry between meals.  Subjective:   Prediabetes. Clara has a diagnosis of prediabetes based on his elevated HgA1c and was informed this puts him at greater risk of developing diabetes. He continues to work on diet and exercise to decrease his risk of diabetes. He denies nausea or hypoglycemia. Amori is on no medications. No polyphagia.  Lab Results  Component Value Date   HGBA1C 5.5 09/20/2019   Lab Results  Component Value Date   INSULIN 24.1 09/20/2019   INSULIN 23.1 01/19/2019   INSULIN 21.7 08/05/2018   INSULIN 23.2 11/11/2017   INSULIN 27.6 (H) 08/06/2017   Assessment/Plan:   Prediabetes. Wesson will continue to work on weight loss, exercise, and decreasing simple carbohydrates to help decrease the risk of diabetes.   Class 3 severe obesity with serious comorbidity and body mass index (BMI) of 40.0 to 44.9 in adult, unspecified obesity type ([facility]).  Dashon is currently in the action stage of change. As such, his goal is to continue with weight loss efforts. He has agreed to the Category 4 Plan.   Exercise goals: For substantial health benefits, adults should do at least 150 minutes (2 hours and 30 minutes) a week of moderate-intensity, or 75 minutes (1 hour and 15 minutes) a week of  vigorous-intensity aerobic physical activity, or an equivalent combination of moderate- and vigorous-intensity aerobic activity. Aerobic activity should be performed in episodes of at least 10 minutes, and preferably, it should be spread throughout the week.  Behavioral modification strategies: meal planning and cooking strategies and keeping healthy foods in the home.  Terre has agreed to follow-up with our clinic in 3 weeks. He was informed of the importance of frequent follow-up visits to maximize his success with intensive lifestyle modifications for his multiple health conditions.   Objective:   Blood pressure 129/78, pulse 70, temperature 98 F (36.7 C), temperature source Oral, height 5\' 9"  (1.753 m), weight 297 lb (134.7 kg), SpO2 98 %. Body mass index is 43.86 kg/m.  General: Cooperative, alert, well developed, in no acute distress. HEENT: Conjunctivae and lids unremarkable. Cardiovascular: Regular rhythm.  Lungs: Normal work of breathing. Neurologic: No focal deficits.   Lab Results  Component Value Date   CREATININE 1.34 (H) 09/20/2019   BUN 16 09/20/2019   NA 147 (H) 09/20/2019   K 4.4 09/20/2019   CL 110 (H) 09/20/2019   CO2 26 09/20/2019   Lab Results  Component Value Date   ALT 20 09/20/2019   AST 14 09/20/2019   ALKPHOS 74 09/20/2019   BILITOT 0.3 09/20/2019   Lab Results  Component Value Date   HGBA1C 5.5 09/20/2019   HGBA1C 5.3 01/19/2019   HGBA1C 5.4 08/05/2018   HGBA1C 5.4 11/11/2017   HGBA1C 5.3 08/06/2017   Lab Results  Component Value Date   INSULIN 24.1 09/20/2019   INSULIN 23.1 01/19/2019   INSULIN 21.7 08/05/2018   INSULIN 23.2 11/11/2017   INSULIN 27.6 (H) 08/06/2017   Lab Results  Component Value Date   TSH 2.27 09/02/2018   Lab Results  Component Value Date   CHOL 197 09/20/2019   HDL 42 09/20/2019   LDLCALC 138 (H) 09/20/2019   TRIG 92 09/20/2019   CHOLHDL 5 03/17/2015   Lab Results  Component Value Date   WBC 6.6  12/24/2016   HGB 15.2 12/24/2016   HCT 44.3 12/24/2016   MCV 81 12/24/2016   PLT 300.0 11/07/2015   No results found for: IRON, TIBC, FERRITIN  Attestation Statements:   Reviewed by clinician on day of visit: allergies, medications, problem list, medical history, surgical history, family history, social history, and previous encounter notes.  Time spent on visit including pre-visit chart review and post-visit charting and care was 30 minutes.   IMarianna Payment, am acting as transcriptionist for Alois Cliche, PA-C   I have reviewed the above documentation for accuracy and completeness, and I agree with the above. Alois Cliche, PA-C

## 2020-06-12 ENCOUNTER — Encounter (INDEPENDENT_AMBULATORY_CARE_PROVIDER_SITE_OTHER): Payer: Self-pay | Admitting: Physician Assistant

## 2020-06-12 NOTE — Telephone Encounter (Signed)
Review.

## 2020-06-15 ENCOUNTER — Ambulatory Visit (INDEPENDENT_AMBULATORY_CARE_PROVIDER_SITE_OTHER): Payer: 59 | Admitting: Physician Assistant

## 2020-06-26 ENCOUNTER — Other Ambulatory Visit: Payer: Self-pay

## 2020-06-26 ENCOUNTER — Encounter (INDEPENDENT_AMBULATORY_CARE_PROVIDER_SITE_OTHER): Payer: Self-pay | Admitting: Physician Assistant

## 2020-06-26 ENCOUNTER — Ambulatory Visit (INDEPENDENT_AMBULATORY_CARE_PROVIDER_SITE_OTHER): Payer: 59 | Admitting: Physician Assistant

## 2020-06-26 VITALS — BP 127/85 | HR 79 | Temp 98.3°F | Ht 69.0 in | Wt 297.0 lb

## 2020-06-26 DIAGNOSIS — I10 Essential (primary) hypertension: Secondary | ICD-10-CM | POA: Diagnosis not present

## 2020-06-26 DIAGNOSIS — F3289 Other specified depressive episodes: Secondary | ICD-10-CM | POA: Diagnosis not present

## 2020-06-26 DIAGNOSIS — E559 Vitamin D deficiency, unspecified: Secondary | ICD-10-CM | POA: Diagnosis not present

## 2020-06-26 DIAGNOSIS — R7303 Prediabetes: Secondary | ICD-10-CM | POA: Diagnosis not present

## 2020-06-26 DIAGNOSIS — E7849 Other hyperlipidemia: Secondary | ICD-10-CM | POA: Diagnosis not present

## 2020-06-26 DIAGNOSIS — Z6841 Body Mass Index (BMI) 40.0 and over, adult: Secondary | ICD-10-CM | POA: Diagnosis not present

## 2020-06-26 DIAGNOSIS — Z9189 Other specified personal risk factors, not elsewhere classified: Secondary | ICD-10-CM

## 2020-06-26 MED ORDER — BUPROPION HCL ER (SR) 200 MG PO TB12: 200.0000 mg | ORAL_TABLET | Freq: Every day | ORAL | 0 refills | Status: DC

## 2020-06-26 MED ORDER — DILTIAZEM HCL ER COATED BEADS 240 MG PO TB24: 240.0000 mg | ORAL_TABLET | Freq: Every day | ORAL | 0 refills | Status: DC

## 2020-06-26 MED ORDER — CHLORTHALIDONE 25 MG PO TABS: 25.0000 mg | ORAL_TABLET | Freq: Every day | ORAL | 0 refills | Status: DC

## 2020-06-26 MED FILL — BUPROPION HCL SR 200 MG TAB: 200 | 30 days supply | Qty: 30 | Fill #0

## 2020-06-26 MED FILL — DILTIAZEM ER 240 MG TABLET: 240 | 90 days supply | Qty: 90 | Fill #0

## 2020-06-26 MED FILL — CHLORTHALIDONE 25 MG TABS: 25 | 90 days supply | Qty: 90 | Fill #0

## 2020-06-26 NOTE — Progress Notes (Signed)
Chief Complaint:   OBESITY Daniel [name] is here to discuss his progress with his obesity treatment plan along with follow-up of his obesity related diagnoses. Daniel [name] is on the Category 3 Plan and states he is following his eating plan approximately 80% of the time. Menno states he is doing weights 90 minutes 4 days per week and doing cardio 90 minutes 1 time per week.  Today's visit was #: 59 Starting weight: 309 lbs Starting date: 12/24/2016 Today's weight: 297 lbs Today's date: 06/26/2020 Total lbs lost to date: 12 Total lbs lost since last in-office visit: 0  Interim History: Daniel [name] states that he is doing a hybrid between the Category 3 Plan and the Category 4 Plan. He is also journaling intermittently.  Subjective:   Other hyperlipidemia. Daniel [name] is on no medication. No chest pain. He is exercising regularly.  Lab Results  Component Value Date   CHOL 197 09/20/2019   HDL 42 09/20/2019   LDLCALC 138 (H) 09/20/2019   TRIG 92 09/20/2019   CHOLHDL 5 03/17/2015   Lab Results  Component Value Date   ALT 20 09/20/2019   AST 14 09/20/2019   ALKPHOS 74 09/20/2019   BILITOT 0.3 09/20/2019   The 10-year ASCVD risk score Denman George DC Jr., et al., 2013) is: 7.8%   Values used to calculate the score:     Age: 42 years     Sex: Male     Is Non-Hispanic African American: Yes     Diabetic: No     Tobacco smoker: No     Systolic Blood Pressure: 127 mmHg     Is BP treated: Yes     HDL Cholesterol: 42 mg/dL     Total Cholesterol: 197 mg/dL  Prediabetes. Kyshon has a diagnosis of prediabetes based on his elevated HgA1c and was informed this puts him at greater risk of developing diabetes. He continues to work on diet and exercise to decrease his risk of diabetes. He denies nausea or hypoglycemia. Daniel [name] is on no medication. No polyphagia. He is due for labs.  Lab Results  Component Value Date   HGBA1C 5.5 09/20/2019   Lab Results  Component Value Date   INSULIN 24.1  09/20/2019   INSULIN 23.1 01/19/2019   INSULIN 21.7 08/05/2018   INSULIN 23.2 11/11/2017   INSULIN 27.6 (H) 08/06/2017   Vitamin D deficiency. Daniel [name] is not on Vitamin D supplementation currently. No fatigue. He is due for labs.   Ref. Range 09/20/2019 09:28  Vitamin D, 25-Hydroxy Latest Ref Range: 30.0 - 100.0 ng/mL 58.9   Essential hypertension. Blood pressure is controlled today. No  chest pain or headache.   BP Readings from Last 3 Encounters:  06/26/20 127/85  06/01/20 129/78  05/18/20 (!) 145/73   Lab Results  Component Value Date   CREATININE 1.34 (H) 09/20/2019   CREATININE 1.22 01/19/2019   CREATININE 1.20 08/05/2018   Other depression - with emotional eating. Daniel [name] is struggling with emotional eating and using food for comfort to the extent that it is negatively impacting his health. He has been working on behavior modification techniques to help reduce his emotional eating and has been somewhat successful. He shows no sign of suicidal or homicidal ideations. Blood pressure is normal today. No cravings.  At risk for diabetes mellitus. Daniel [name] is at higher than average risk for developing diabetes due to his obesity.   Assessment/Plan:   Other hyperlipidemia. Cardiovascular risk and specific lipid/LDL goals reviewed.  We discussed  several lifestyle modifications today and Lelend will continue to work on diet, exercise and weight loss efforts. Orders and follow up as documented in patient record. Lipid panel will be checked today.  Counseling Intensive lifestyle modifications are the first line treatment for this issue. . Dietary changes: Increase soluble fiber. Decrease simple carbohydrates. . Exercise changes: Moderate to vigorous-intensity aerobic activity 150 minutes per week if tolerated. . Lipid-lowering medications: see documented in medical record.   Prediabetes. Daniel [name] will continue to work on weight loss, exercise, and decreasing simple carbohydrates to help  decrease the risk of diabetes. Hemoglobin A1c, Insulin, random will be checked today.  Vitamin D deficiency. Low Vitamin D level contributes to fatigue and are associated with obesity, breast, and colon cancer. VITAMIN D 25 Hydroxy (Vit-D Deficiency, Fractures) level will be checked today.  Essential hypertension. Daniel [name] is working on healthy weight loss and exercise to improve blood pressure control. We will watch for signs of hypotension as he continues his lifestyle modifications. Refills were given for diltiazem (CARDIZEM LA) 240 MG 24 hr tablet #90 with 0 refills and chlorthalidone (HYGROTON) 25 MG tablet #90 with 0 refills. Comprehensive metabolic panel will be checked today.  Other depression - with emotional eating. Behavior modification techniques were discussed today to help Daniel [name] deal with his emotional/non-hunger eating behaviors.  Orders and follow up as documented in patient record. Refill was given for buPROPion (WELLBUTRIN SR) 200 MG 12 hr tablet #30 with 0 refills.  At risk for diabetes mellitus. Daniel [name] was given approximately 15 minutes of diabetes education and counseling today. We discussed intensive lifestyle modifications today with an emphasis on weight loss as well as increasing exercise and decreasing simple carbohydrates in his diet. We also reviewed medication options with an emphasis on risk versus benefit of those discussed.   Repetitive spaced learning was employed today to elicit superior memory formation and behavioral change.  Class 3 severe obesity with serious comorbidity and body mass index (BMI) of 40.0 to 44.9 in adult, unspecified obesity type (HCC).  Daniel [name] is currently in the action stage of change. As such, his goal is to continue with weight loss efforts. He has agreed to the Category 4 Plan.   Exercise goals: For substantial health benefits, adults should do at least 150 minutes (2 hours and 30 minutes) a week of moderate-intensity, or 75 minutes (1 hour  and 15 minutes) a week of vigorous-intensity aerobic physical activity, or an equivalent combination of moderate- and vigorous-intensity aerobic activity. Aerobic activity should be performed in episodes of at least 10 minutes, and preferably, it should be spread throughout the week.  Behavioral modification strategies: meal planning and cooking strategies and planning for success.  Dorean has agreed to follow-up with our clinic in 3 weeks. He was informed of the importance of frequent follow-up visits to maximize his success with intensive lifestyle modifications for his multiple health conditions.   Pasha was informed we would discuss his lab results at his next visit unless there is a critical issue that needs to be addressed sooner. Ayodeji agreed to keep his next visit at the agreed upon time to discuss these results.  Objective:   Blood pressure 127/85, pulse 79, temperature 98.3 F (36.8 C), temperature source Oral, height 5\' 9"  (1.753 m), weight (!) 297 lb (134.7 kg), SpO2 95 %. Body mass index is 43.86 kg/m.  General: Cooperative, alert, well developed, in no acute distress. HEENT: Conjunctivae and lids unremarkable. Cardiovascular: Regular rhythm.  Lungs: Normal work of breathing. Neurologic:  No focal deficits.   Lab Results  Component Value Date   CREATININE 1.34 (H) 09/20/2019   BUN 16 09/20/2019   NA 147 (H) 09/20/2019   K 4.4 09/20/2019   CL 110 (H) 09/20/2019   CO2 26 09/20/2019   Lab Results  Component Value Date   ALT 20 09/20/2019   AST 14 09/20/2019   ALKPHOS 74 09/20/2019   BILITOT 0.3 09/20/2019   Lab Results  Component Value Date   HGBA1C 5.5 09/20/2019   HGBA1C 5.3 01/19/2019   HGBA1C 5.4 08/05/2018   HGBA1C 5.4 11/11/2017   HGBA1C 5.3 08/06/2017   Lab Results  Component Value Date   INSULIN 24.1 09/20/2019   INSULIN 23.1 01/19/2019   INSULIN 21.7 08/05/2018   INSULIN 23.2 11/11/2017   INSULIN 27.6 (H) 08/06/2017   Lab Results  Component  Value Date   TSH 2.27 09/02/2018   Lab Results  Component Value Date   CHOL 197 09/20/2019   HDL 42 09/20/2019   LDLCALC 138 (H) 09/20/2019   TRIG 92 09/20/2019   CHOLHDL 5 03/17/2015   Lab Results  Component Value Date   WBC 6.6 12/24/2016   HGB 15.2 12/24/2016   HCT 44.3 12/24/2016   MCV 81 12/24/2016   PLT 300.0 11/07/2015   No results found for: IRON, TIBC, FERRITIN  Attestation Statements:   Reviewed by clinician on day of visit: allergies, medications, problem list, medical history, surgical history, family history, social history, and previous encounter notes.  IMarianna Payment, am acting as transcriptionist for Alois Cliche, PA-C   I have reviewed the above documentation for accuracy and completeness, and I agree with the above. Alois Cliche, PA-C

## 2020-06-27 LAB — HEMOGLOBIN A1C
Est. average glucose Bld gHb Est-mCnc: 111 mg/dL
Hgb A1c MFr Bld: 5.5 % (ref 4.8–5.6)

## 2020-06-27 LAB — LIPID PANEL
Chol/HDL Ratio: 5 ratio (ref 0.0–5.0)
Cholesterol, Total: 195 mg/dL (ref 100–199)
HDL: 39 mg/dL — ABNORMAL LOW (ref >39)
LDL Chol Calc (NIH): 141 mg/dL — ABNORMAL HIGH (ref 0–99)
Triglycerides: 80 mg/dL (ref 0–149)
VLDL Cholesterol Cal: 15 mg/dL (ref 5–40)

## 2020-06-27 LAB — COMPREHENSIVE METABOLIC PANEL
ALT: 29 IU/L (ref 0–44)
AST: 20 IU/L (ref 0–40)
Albumin/Globulin Ratio: 1.3 (ref 1.2–2.2)
Albumin: 4.4 g/dL (ref 4.0–5.0)
Alkaline Phosphatase: 68 IU/L (ref 48–121)
BUN/Creatinine Ratio: 16 (ref 9–20)
BUN: 19 mg/dL (ref 6–24)
Bilirubin Total: 0.3 mg/dL (ref 0.0–1.2)
CO2: 26 mmol/L (ref 20–29)
Calcium: 9.4 mg/dL (ref 8.7–10.2)
Chloride: 101 mmol/L (ref 96–106)
Creatinine, Ser: 1.19 mg/dL (ref 0.76–1.27)
GFR calc Af Amer: 84 mL/min/{1.73_m2} (ref >59)
GFR calc non Af Amer: 72 mL/min/{1.73_m2} (ref >59)
Globulin, Total: 3.4 g/dL (ref 1.5–4.5)
Glucose: 89 mg/dL (ref 65–99)
Potassium: 3.5 mmol/L (ref 3.5–5.2)
Sodium: 140 mmol/L (ref 134–144)
Total Protein: 7.8 g/dL (ref 6.0–8.5)

## 2020-06-27 LAB — INSULIN, RANDOM: INSULIN: 37.8 u[IU]/mL — ABNORMAL HIGH (ref 2.6–24.9)

## 2020-06-27 LAB — VITAMIN D 25 HYDROXY (VIT D DEFICIENCY, FRACTURES): Vit D, 25-Hydroxy: 63.9 ng/mL (ref 30.0–100.0)

## 2020-07-18 ENCOUNTER — Ambulatory Visit (INDEPENDENT_AMBULATORY_CARE_PROVIDER_SITE_OTHER): Payer: 59 | Admitting: Physician Assistant

## 2020-07-18 ENCOUNTER — Other Ambulatory Visit: Payer: Self-pay

## 2020-07-18 ENCOUNTER — Encounter (INDEPENDENT_AMBULATORY_CARE_PROVIDER_SITE_OTHER): Payer: Self-pay | Admitting: Physician Assistant

## 2020-07-18 VITALS — BP 138/76 | HR 72 | Temp 97.9°F | Ht 69.0 in | Wt 290.0 lb

## 2020-07-18 DIAGNOSIS — R7303 Prediabetes: Secondary | ICD-10-CM | POA: Diagnosis not present

## 2020-07-18 DIAGNOSIS — Z9189 Other specified personal risk factors, not elsewhere classified: Secondary | ICD-10-CM

## 2020-07-18 DIAGNOSIS — E7849 Other hyperlipidemia: Secondary | ICD-10-CM | POA: Diagnosis not present

## 2020-07-18 DIAGNOSIS — Z6841 Body Mass Index (BMI) 40.0 and over, adult: Secondary | ICD-10-CM

## 2020-07-18 NOTE — Progress Notes (Signed)
Chief Complaint:   OBESITY Daniel [name] is here to discuss his progress with his obesity treatment plan along with follow-up of his obesity related diagnoses. Daniel [name] is on the Category 4 Plan and states he is following his eating plan approximately 85% of the time. Daniel [name] states he is doing weights 90 minutes 5 times per week and cardio 45 minutes 5 times per week.  Today's visit was #: 60 Starting weight: 309 lbs Starting date: 12/24/2016 Today's weight: 290 lbs Today's date: 07/18/2020 Total lbs lost to date: 19 Total lbs lost since last in-office visit: 7  Interim History: Daniel [name] reports that he is journaling as well as following the Category 4 meal plan. He is getting around 180 grams of protein daily with just food and is also drinking a protein shake after workouts.  Subjective:   Prediabetes. Daniel [name] has a diagnosis of prediabetes based on his elevated HgA1c and was informed this puts him at greater risk of developing diabetes. He continues to work on diet and exercise to decrease his risk of diabetes. He denies nausea or hypoglycemia. Insulin level is elevated. Medication was discussed today for worsening insulin level. He is exercising regularly.  Lab Results  Component Value Date   HGBA1C 5.5 06/26/2020   Lab Results  Component Value Date   INSULIN 37.8 (H) 06/26/2020   INSULIN 24.1 09/20/2019   INSULIN 23.1 01/19/2019   INSULIN 21.7 08/05/2018   INSULIN 23.2 11/11/2017   Other hyperlipidemia. Daniel [name] is on no medication. No chest pain. LDL has worsened from 138 to 141.   Lab Results  Component Value Date   CHOL 195 06/26/2020   HDL 39 (L) 06/26/2020   LDLCALC 141 (H) 06/26/2020   TRIG 80 06/26/2020   CHOLHDL 5.0 06/26/2020   Lab Results  Component Value Date   ALT 29 06/26/2020   AST 20 06/26/2020   ALKPHOS 68 06/26/2020   BILITOT 0.3 06/26/2020   The 10-year ASCVD risk score Denman George DC Jr., et al., 2013) is: 9.3%   Values used to calculate the  score:     Age: 49 years     Sex: Male     Is Non-Hispanic African American: Yes     Diabetic: No     Tobacco smoker: No     Systolic Blood Pressure: 138 mmHg     Is BP treated: Yes     HDL Cholesterol: 39 mg/dL     Total Cholesterol: 195 mg/dL  At risk for heart disease. Daniel [name] is at a higher than average risk for cardiovascular disease due to obesity.   Assessment/Plan:   Prediabetes. Daniel [name] will continue to work on weight loss, exercise, and decreasing simple carbohydrates to help decrease the risk of diabetes.   Other hyperlipidemia. Cardiovascular risk and specific lipid/LDL goals reviewed.  We discussed several lifestyle modifications today and Daniel [name] will continue to work on diet, exercise and weight loss efforts. Orders and follow up as documented in patient record.   Counseling Intensive lifestyle modifications are the first line treatment for this issue. . Dietary changes: Increase soluble fiber. Decrease simple carbohydrates. . Exercise changes: Moderate to vigorous-intensity aerobic activity 150 minutes per week if tolerated. . Lipid-lowering medications: see documented in medical record.  At risk for heart disease. Daniel [name] was given approximately 15 minutes of coronary artery disease prevention counseling today. He is 49 y.o. male and has risk factors for heart disease including obesity. We discussed intensive lifestyle modifications today with an emphasis on  specific weight loss instructions and strategies.   Repetitive spaced learning was employed today to elicit superior memory formation and behavioral change.  Class 3 severe obesity with serious comorbidity and body mass index (BMI) of 40.0 to 44.9 in adult, unspecified obesity type (HCC).  Daniel [name] is currently in the action stage of change. As such, his goal is to continue with weight loss efforts. He has agreed to keeping a food journal and adhering to recommended goals of 1800-2000 calories and 130 grams of protein  daily.   Exercise goals: For substantial health benefits, adults should do at least 150 minutes (2 hours and 30 minutes) a week of moderate-intensity, or 75 minutes (1 hour and 15 minutes) a week of vigorous-intensity aerobic physical activity, or an equivalent combination of moderate- and vigorous-intensity aerobic activity. Aerobic activity should be performed in episodes of at least 10 minutes, and preferably, it should be spread throughout the week.  Behavioral modification strategies: meal planning and cooking strategies and keeping healthy foods in the home.  Daniel [name] has agreed to follow-up with our clinic in 3 weeks. He was informed of the importance of frequent follow-up visits to maximize his success with intensive lifestyle modifications for his multiple health conditions.   Objective:   Blood pressure 138/76, pulse 72, temperature 97.9 F (36.6 C), temperature source Oral, height 5\' 9"  (1.753 m), weight 290 lb (131.5 kg), SpO2 95 %. Body mass index is 42.83 kg/m.  General: Cooperative, alert, well developed, in no acute distress. HEENT: Conjunctivae and lids unremarkable. Cardiovascular: Regular rhythm.  Lungs: Normal work of breathing. Neurologic: No focal deficits.   Lab Results  Component Value Date   CREATININE 1.19 06/26/2020   BUN 19 06/26/2020   NA 140 06/26/2020   K 3.5 06/26/2020   CL 101 06/26/2020   CO2 26 06/26/2020   Lab Results  Component Value Date   ALT 29 06/26/2020   AST 20 06/26/2020   ALKPHOS 68 06/26/2020   BILITOT 0.3 06/26/2020   Lab Results  Component Value Date   HGBA1C 5.5 06/26/2020   HGBA1C 5.5 09/20/2019   HGBA1C 5.3 01/19/2019   HGBA1C 5.4 08/05/2018   HGBA1C 5.4 11/11/2017   Lab Results  Component Value Date   INSULIN 37.8 (H) 06/26/2020   INSULIN 24.1 09/20/2019   INSULIN 23.1 01/19/2019   INSULIN 21.7 08/05/2018   INSULIN 23.2 11/11/2017   Lab Results  Component Value Date   TSH 2.27 09/02/2018   Lab Results   Component Value Date   CHOL 195 06/26/2020   HDL 39 (L) 06/26/2020   LDLCALC 141 (H) 06/26/2020   TRIG 80 06/26/2020   CHOLHDL 5.0 06/26/2020   Lab Results  Component Value Date   WBC 6.6 12/24/2016   HGB 15.2 12/24/2016   HCT 44.3 12/24/2016   MCV 81 12/24/2016   PLT 300.0 11/07/2015   No results found for: IRON, TIBC, FERRITIN  Attestation Statements:   Reviewed by clinician on day of visit: allergies, medications, problem list, medical history, surgical history, family history, social history, and previous encounter notes.  I12/15/2016, am acting as transcriptionist for Marianna Payment, PA-C   I have reviewed the above documentation for accuracy and completeness, and I agree with the above. Alois Cliche, PA-C

## 2020-08-08 ENCOUNTER — Ambulatory Visit (INDEPENDENT_AMBULATORY_CARE_PROVIDER_SITE_OTHER): Payer: 59 | Admitting: Physician Assistant

## 2020-08-17 MED FILL — MONTELUKAST SOD 10 MG TAB: 10 | 90 days supply | Qty: 90 | Fill #2

## 2020-08-17 MED FILL — LEVOCETIRIZINE 5 MG TABLET: 5 | 90 days supply | Qty: 90 | Fill #2

## 2020-10-31 ENCOUNTER — Other Ambulatory Visit (INDEPENDENT_AMBULATORY_CARE_PROVIDER_SITE_OTHER): Payer: Self-pay | Admitting: Physician Assistant

## 2020-10-31 ENCOUNTER — Other Ambulatory Visit: Payer: Self-pay

## 2020-10-31 ENCOUNTER — Encounter (INDEPENDENT_AMBULATORY_CARE_PROVIDER_SITE_OTHER): Payer: Self-pay | Admitting: Physician Assistant

## 2020-10-31 ENCOUNTER — Ambulatory Visit (INDEPENDENT_AMBULATORY_CARE_PROVIDER_SITE_OTHER): Payer: 59 | Admitting: Physician Assistant

## 2020-10-31 VITALS — BP 127/74 | HR 67 | Temp 98.0°F | Ht 69.0 in | Wt 300.0 lb

## 2020-10-31 DIAGNOSIS — F3289 Other specified depressive episodes: Secondary | ICD-10-CM | POA: Diagnosis not present

## 2020-10-31 DIAGNOSIS — Z6841 Body Mass Index (BMI) 40.0 and over, adult: Secondary | ICD-10-CM

## 2020-10-31 DIAGNOSIS — Z9189 Other specified personal risk factors, not elsewhere classified: Secondary | ICD-10-CM | POA: Diagnosis not present

## 2020-10-31 DIAGNOSIS — I10 Essential (primary) hypertension: Secondary | ICD-10-CM

## 2020-10-31 MED ORDER — CHLORTHALIDONE 25 MG PO TABS: 25.0000 mg | ORAL_TABLET | Freq: Every day | ORAL | 0 refills | Status: DC

## 2020-10-31 MED ORDER — BUPROPION HCL ER (SR) 200 MG PO TB12: 200.0000 mg | ORAL_TABLET | Freq: Every day | ORAL | 0 refills | Status: DC

## 2020-10-31 MED ORDER — DILTIAZEM HCL ER COATED BEADS 240 MG PO TB24: 240.0000 mg | ORAL_TABLET | Freq: Every day | ORAL | 0 refills | Status: DC

## 2020-10-31 MED FILL — DILTIAZEM ER 240 MG TABLET: 240 | 90 days supply | Qty: 90 | Fill #0

## 2020-10-31 MED FILL — BUPROPION HCL SR 200 MG TAB: 200 | 30 days supply | Qty: 30 | Fill #0

## 2020-10-31 MED FILL — CHLORTHALIDONE 25 MG TABS: 25 | 90 days supply | Qty: 90 | Fill #0

## 2020-10-31 NOTE — Progress Notes (Signed)
Chief Complaint:   Daniel [name] is here to discuss his progress with his Daniel treatment plan along with follow-up of his Daniel related diagnoses. Daniel [name] is on the Category 3 Plan and states he is following his eating plan approximately 80% of the time. Daniel [name] states he is doing strength training and cardio 90 minutes 5 times per week.  Today's visit was #: 61 Starting weight: 309 lbs Starting date: 12/24/2016 Today's weight: 300 lbs Today's date: 10/31/2020 Total lbs lost to date: 9 Total lbs lost since last in-office visit: 0  Interim History: Daniel [name] reports that he has increased his strength training and does cardio once a week.  He is down 2 pant sizes. He reports averaging 2200-2400 calories daily.   Subjective:   Other depression, with emotional eating. Daniel [name] is struggling with emotional eating and using food for comfort to the extent that it is negatively impacting his health. He has been working on behavior modification techniques to help reduce his emotional eating and has been somewhat successful. He shows no sign of suicidal or homicidal ideations. Daniel [name] reports Wellbutrin helps his cravings.  Essential hypertension. Daniel [name] is on chlorthalidone and Cardizem. Blood pressure is controlled. No  chest pain or headache.   BP Readings from Last 3 Encounters:  10/31/20 127/74  07/18/20 138/76  06/26/20 127/85   Lab Results  Component Value Date   CREATININE 1.19 06/26/2020   CREATININE 1.34 (H) 09/20/2019   CREATININE 1.22 01/19/2019   At risk for heart disease. Daniel [name] is at a higher than average risk for cardiovascular disease due to Daniel.   Assessment/Plan:   Other depression, with emotional eating. Behavior modification techniques were discussed today to help Capers deal with his emotional/non-hunger eating behaviors.  Orders and follow up as documented in patient record. Refill was given for buPROPion (WELLBUTRIN SR) 200 MG 12 hr tablet #30 with 0  refills.  Essential hypertension. Daniel [name] is working on healthy weight loss and exercise to improve blood pressure control. We will watch for signs of hypotension as he continues his lifestyle modifications. Refills were given for chlorthalidone (HYGROTON) 25 MG tablet #90 with 0 refills and diltiazem (CARDIZEM LA) 240 MG 24 hr tablet #90 with 0 refills.  At risk for heart disease. Daniel [name] was given approximately 15 minutes of coronary artery disease prevention counseling today. He is 48 y.o. male and has risk factors for heart disease including Daniel. We discussed intensive lifestyle modifications today with an emphasis on specific weight loss instructions and strategies.   Repetitive spaced learning was employed today to elicit superior memory formation and behavioral change.  Class 3 severe Daniel with serious comorbidity and body mass index (BMI) of 40.0 to 44.9 in adult, unspecified Daniel type (HCC).  Daniel [name] is currently in the action stage of change. As such, his goal is to continue with weight loss efforts. He has agreed to keeping a food journal and adhering to recommended goals of 1800-1900 calories and 115 grams of protein daily.   Exercise goals: For substantial health benefits, adults should do at least 150 minutes (2 hours and 30 minutes) a week of moderate-intensity, or 75 minutes (1 hour and 15 minutes) a week of vigorous-intensity aerobic physical activity, or an equivalent combination of moderate- and vigorous-intensity aerobic activity. Aerobic activity should be performed in episodes of at least 10 minutes, and preferably, it should be spread throughout the week.  Behavioral modification strategies: meal planning and cooking strategies and keeping healthy foods in the  home.  Daniel [name] has agreed to follow-up with our clinic in 4 weeks. He was informed of the importance of frequent follow-up visits to maximize his success with intensive lifestyle modifications for his multiple  health conditions.   Objective:   Blood pressure 127/74, pulse 67, temperature 98 F (36.7 C), height 5\' 9"  (1.753 m), weight 300 lb (136.1 kg), SpO2 97 %. Body mass index is 44.3 kg/m.  General: Cooperative, alert, well developed, in no acute distress. HEENT: Conjunctivae and lids unremarkable. Cardiovascular: Regular rhythm.  Lungs: Normal work of breathing. Neurologic: No focal deficits.   Lab Results  Component Value Date   CREATININE 1.19 06/26/2020   BUN 19 06/26/2020   NA 140 06/26/2020   K 3.5 06/26/2020   CL 101 06/26/2020   CO2 26 06/26/2020   Lab Results  Component Value Date   ALT 29 06/26/2020   AST 20 06/26/2020   ALKPHOS 68 06/26/2020   BILITOT 0.3 06/26/2020   Lab Results  Component Value Date   HGBA1C 5.5 06/26/2020   HGBA1C 5.5 09/20/2019   HGBA1C 5.3 01/19/2019   HGBA1C 5.4 08/05/2018   HGBA1C 5.4 11/11/2017   Lab Results  Component Value Date   INSULIN 37.8 (H) 06/26/2020   INSULIN 24.1 09/20/2019   INSULIN 23.1 01/19/2019   INSULIN 21.7 08/05/2018   INSULIN 23.2 11/11/2017   Lab Results  Component Value Date   TSH 2.27 09/02/2018   Lab Results  Component Value Date   CHOL 195 06/26/2020   HDL 39 (L) 06/26/2020   LDLCALC 141 (H) 06/26/2020   TRIG 80 06/26/2020   CHOLHDL 5.0 06/26/2020   Lab Results  Component Value Date   WBC 6.6 12/24/2016   HGB 15.2 12/24/2016   HCT 44.3 12/24/2016   MCV 81 12/24/2016   PLT 300.0 11/07/2015   No results found for: IRON, TIBC, FERRITIN  Attestation Statements:   Reviewed by clinician on day of visit: allergies, medications, problem list, medical history, surgical history, family history, social history, and previous encounter notes.  I12/15/2016, am acting as transcriptionist for Marianna Payment, PA-C   I have reviewed the above documentation for accuracy and completeness, and I agree with the above. Alois Cliche, PA-C

## 2020-11-15 ENCOUNTER — Other Ambulatory Visit: Payer: Self-pay | Admitting: Family Medicine

## 2020-11-15 MED FILL — LEVOCETIRIZINE 5 MG TABLET: 5 | 90 days supply | Qty: 90 | Fill #3

## 2020-11-15 MED FILL — AMOXICILLIN 500 MG CAPSULE: 500 | 7 days supply | Qty: 30 | Fill #0

## 2020-11-15 MED FILL — MONTELUKAST SOD 10 MG TAB: 10 | 90 days supply | Qty: 90 | Fill #3

## 2020-11-16 MED FILL — OMEPRAZOLE DR 20 MG CAPSULE: 20 | 90 days supply | Qty: 90 | Fill #0

## 2020-11-22 ENCOUNTER — Telehealth: Payer: 59 | Admitting: Family Medicine

## 2020-11-30 ENCOUNTER — Ambulatory Visit (INDEPENDENT_AMBULATORY_CARE_PROVIDER_SITE_OTHER): Payer: 59 | Admitting: Physician Assistant

## 2020-12-07 ENCOUNTER — Other Ambulatory Visit: Payer: Self-pay

## 2020-12-08 ENCOUNTER — Encounter: Payer: Self-pay | Admitting: Family Medicine

## 2020-12-08 ENCOUNTER — Other Ambulatory Visit: Payer: Self-pay | Admitting: Family Medicine

## 2020-12-08 ENCOUNTER — Ambulatory Visit: Payer: 59 | Admitting: Family Medicine

## 2020-12-08 VITALS — BP 120/80 | HR 93 | Temp 98.2°F | Ht 69.0 in | Wt 303.8 lb

## 2020-12-08 DIAGNOSIS — U071 COVID-19: Secondary | ICD-10-CM | POA: Diagnosis not present

## 2020-12-08 DIAGNOSIS — M7701 Medial epicondylitis, right elbow: Secondary | ICD-10-CM | POA: Diagnosis not present

## 2020-12-08 DIAGNOSIS — F900 Attention-deficit hyperactivity disorder, predominantly inattentive type: Secondary | ICD-10-CM | POA: Diagnosis not present

## 2020-12-08 MED ORDER — AMPHETAMINE-DEXTROAMPHET ER 10 MG PO CP24
10.0000 mg | ORAL_CAPSULE | Freq: Every day | ORAL | 0 refills | Status: DC
Start: 2020-12-08 — End: 2021-01-08

## 2020-12-08 MED FILL — ADDERALL XR 10 MG CAP SA: 10 | 30 days supply | Qty: 30 | Fill #0

## 2020-12-08 MED FILL — OMEPRAZOLE DR 20 MG CAPSULE: 20 | 90 days supply | Qty: 90 | Fill #0

## 2020-12-08 NOTE — Progress Notes (Signed)
   Subjective:    Patient ID: Daniel [name], male    DOB: 1971-06-11, 50 y.o.   MRN: [id number]  HPI Here for several issues. First he had a Covid-19 infection in December, along with his wife and daughter. He was sick for about 8 days with fever to 103 degrees, a cough, ST, and diarrhea. He was able to treat this at home with rest and drinking fluids. Since then he says he is back to normal except his overall energy level is still a little low. He has received 2 Covid vaccines but he asks about getting a booster, and of so when. Second for about 2 months he has had a mild pain in the right elbow. This does ot stop him from lifting weights in the gym, but it is annoying. 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 He has gotten some relief with Ibuprofen. Third he asks for help with focus. He had good grades in school as a child but he often got in trouble for talking or disrupting his classrooms. Now as an adult he says he has trouble finishing tasks and staying focused on tasks he finds himself easily distracted by things. Of note his 4 year old daughter has been treated for ADHD since she was 48 years old. He has been taking Wellbutrin through [facility] for appetite suppression.    Review of Systems  Constitutional: Positive for fatigue.  Respiratory: Negative.   Cardiovascular: Negative.   Musculoskeletal: Positive for arthralgias.  Psychiatric/Behavioral: Positive for decreased concentration.       Objective:   Physical Exam Constitutional:      Appearance: Normal appearance.  Cardiovascular:     Rate and Rhythm: Normal rate and regular rhythm.     Pulses: Normal pulses.     Heart sounds: Normal heart sounds.  Pulmonary:     Effort: Pulmonary effort is normal.     Breath sounds: Normal breath sounds.  Musculoskeletal:     Comments: He is tender over the right medial epicondyle, no  swelling, full ROM  Neurological:     General: No focal deficit present.     Mental Status: He is alert and oriented to person, place, and time.  Psychiatric:        Mood and Affect: Mood normal.        Thought Content: Thought content normal.           Assessment & Plan:  He has recovered from a Covid infection. I reassured him it is common to take weeks to months for his energy level to come back. I urged him to get the vaccine booster, and he needs to wait for a 90 day period before getting this. Also he has medial epicondylitis, and the bets way to treat this is to rest the arm. He will avoid working on his computer as much as possible for a few weeks to allow this to heal. He can apply ice as well. Lastly he has ADHD, and we will treat this with Adderall XR 10 mg daily. He will stop the Wellbutrin. Recheck in 3-4 weeks.  [name] Crane, MD

## 2020-12-19 ENCOUNTER — Ambulatory Visit (INDEPENDENT_AMBULATORY_CARE_PROVIDER_SITE_OTHER): Payer: 59 | Admitting: Physician Assistant

## 2020-12-19 ENCOUNTER — Encounter (INDEPENDENT_AMBULATORY_CARE_PROVIDER_SITE_OTHER): Payer: Self-pay | Admitting: Physician Assistant

## 2020-12-19 ENCOUNTER — Other Ambulatory Visit: Payer: Self-pay

## 2020-12-19 VITALS — Ht 69.0 in | Wt 296.0 lb

## 2020-12-19 DIAGNOSIS — R7303 Prediabetes: Secondary | ICD-10-CM | POA: Diagnosis not present

## 2020-12-19 DIAGNOSIS — E7849 Other hyperlipidemia: Secondary | ICD-10-CM

## 2020-12-19 DIAGNOSIS — Z6841 Body Mass Index (BMI) 40.0 and over, adult: Secondary | ICD-10-CM

## 2020-12-20 NOTE — Progress Notes (Unsigned)
Chief Complaint:   OBESITY Daniel [name] is here to discuss his progress with his obesity treatment plan along with follow-up of his obesity related diagnoses. Daniel [name] is on keeping a food journal and adhering to recommended goals of 1800-1900 calories and 115 grams of protein daily and states he is following his eating plan approximately 80% of the time. [name] states he is doing weights and cardio for 60 minutes 4-5 times per week.  Today's visit was #: 62 Starting weight: 309 lbs Starting date: 12/24/2016 Today's weight: 296 lbs Today's date: 12/19/2020 Total lbs lost to date: 13 Total lbs lost since last in-office visit: 4  Interim History: Daniel [name] was sick with COVID 3-4 weeks ago. His appetite was decreased and has slowly gotten back to normal. His primary care physician recently diagnosed him with ADHD and put him on Adderall. He is not hungry and has been skipping meals. He has setup his routine to try to get all of his food in, and setting reminders in his phone to eat.  Subjective:   1. Pre-diabetes Daniel [name] is not on medications, and she denies polyphagia after starting Adderall which has significantly decreased his appetite. Last A1c was 5.5. he is exercising regularly.  2. Other hyperlipidemia Daniel [name]'s Last LDL and HDL were at goal. He is not on medications, and he denies chest pain.  Assessment/Plan:   1. Pre-diabetes Shanard will continue his meal plan, and will continue to work on weight loss, exercise, and decreasing simple carbohydrates to help decrease the risk of diabetes. We will recheck labs at his next visit.  2. Other hyperlipidemia Cardiovascular risk and specific lipid/LDL goals reviewed. We discussed several lifestyle modifications today. Daniel [name] will continue to work on diet, exercise and weight loss efforts. He will follow up with his primary care physician and will continue to monitor lipids. We will recheck labs at his next visit. Orders and follow up as  documented in patient record.   Counseling Intensive lifestyle modifications are the first line treatment for this issue. . Dietary changes: Increase soluble fiber. Decrease simple carbohydrates. . Exercise changes: Moderate to vigorous-intensity aerobic activity 150 minutes per week if tolerated. . Lipid-lowering medications: see documented in medical record.  3. Class 3 severe obesity with serious comorbidity and body mass index (BMI) of 40.0 to 44.9 in adult, unspecified obesity type (HCC) Daniel [name] is currently in the action stage of change. As such, his goal is to continue with weight loss efforts. He has agreed to keeping a food journal and adhering to recommended goals of 1800 calories and 115 grams of protein daily.   Exercise goals: As is.  Behavioral modification strategies: no skipping meals and keeping healthy foods in the home.  Daniel [name] has agreed to follow-up with our clinic in 3 to 4 weeks. He was informed of the importance of frequent follow-up visits to maximize his success with intensive lifestyle modifications for his multiple health conditions.   Objective:   Height 5\' 9"  (1.753 m), weight 296 lb (134.3 kg). Body mass index is 43.71 kg/m.  General: Cooperative, alert, well developed, in no acute distress. HEENT: Conjunctivae and lids unremarkable. Cardiovascular: Regular rhythm.  Lungs: Normal work of breathing. Neurologic: No focal deficits.   Lab Results  Component Value Date   CREATININE 1.19 06/26/2020   BUN 19 06/26/2020   NA 140 06/26/2020   K 3.5 06/26/2020   CL 101 06/26/2020   CO2 26 06/26/2020   Lab Results  Component Value Date   ALT  29 06/26/2020   AST 20 06/26/2020   ALKPHOS 68 06/26/2020   BILITOT 0.3 06/26/2020   Lab Results  Component Value Date   HGBA1C 5.5 06/26/2020   HGBA1C 5.5 09/20/2019   HGBA1C 5.3 01/19/2019   HGBA1C 5.4 08/05/2018   HGBA1C 5.4 11/11/2017   Lab Results  Component Value Date   INSULIN 37.8 (H) 06/26/2020    INSULIN 24.1 09/20/2019   INSULIN 23.1 01/19/2019   INSULIN 21.7 08/05/2018   INSULIN 23.2 11/11/2017   Lab Results  Component Value Date   TSH 2.27 09/02/2018   Lab Results  Component Value Date   CHOL 195 06/26/2020   HDL 39 (L) 06/26/2020   LDLCALC 141 (H) 06/26/2020   TRIG 80 06/26/2020   CHOLHDL 5.0 06/26/2020   Lab Results  Component Value Date   WBC 6.6 12/24/2016   HGB 15.2 12/24/2016   HCT 44.3 12/24/2016   MCV 81 12/24/2016   PLT 300.0 11/07/2015   No results found for: IRON, TIBC, FERRITIN  Attestation Statements:   Reviewed by clinician on day of visit: allergies, medications, problem list, medical history, surgical history, family history, social history, and previous encounter notes.  Time spent on visit including pre-visit chart review and post-visit care and charting was 31 minutes.    Trude Mcburney, am acting as transcriptionist for [employer], PA-C.  I have reviewed the above documentation for accuracy and completeness, and I agree with the above. Alois Cliche, PA-C

## 2021-01-04 ENCOUNTER — Encounter: Payer: Self-pay | Admitting: Family Medicine

## 2021-01-08 ENCOUNTER — Other Ambulatory Visit: Payer: Self-pay | Admitting: Family Medicine

## 2021-01-08 MED ORDER — AMPHETAMINE-DEXTROAMPHET ER 20 MG PO CP24: 20.0000 mg | ORAL_CAPSULE | ORAL | 0 refills | Status: DC

## 2021-01-08 MED FILL — ADDERALL XR 20 MG CAP SA: 20 | 30 days supply | Qty: 30 | Fill #0

## 2021-01-08 NOTE — Telephone Encounter (Signed)
I sent in the XR 20 mg for him to try

## 2021-01-16 ENCOUNTER — Ambulatory Visit (INDEPENDENT_AMBULATORY_CARE_PROVIDER_SITE_OTHER): Payer: 59 | Admitting: Physician Assistant

## 2021-01-18 DIAGNOSIS — H524 Presbyopia: Secondary | ICD-10-CM | POA: Diagnosis not present

## 2021-02-12 ENCOUNTER — Other Ambulatory Visit (INDEPENDENT_AMBULATORY_CARE_PROVIDER_SITE_OTHER): Payer: Self-pay | Admitting: Physician Assistant

## 2021-02-12 DIAGNOSIS — I10 Essential (primary) hypertension: Secondary | ICD-10-CM

## 2021-02-12 NOTE — Telephone Encounter (Signed)
Abdon 

## 2021-02-13 ENCOUNTER — Other Ambulatory Visit (INDEPENDENT_AMBULATORY_CARE_PROVIDER_SITE_OTHER): Payer: Self-pay | Admitting: Physician Assistant

## 2021-02-13 ENCOUNTER — Encounter (INDEPENDENT_AMBULATORY_CARE_PROVIDER_SITE_OTHER): Payer: Self-pay | Admitting: Physician Assistant

## 2021-02-13 ENCOUNTER — Other Ambulatory Visit: Payer: Self-pay | Admitting: Family Medicine

## 2021-02-13 MED FILL — CHLORTHALIDONE 25 MG TABS: 25 | 30 days supply | Qty: 30 | Fill #0

## 2021-02-13 MED FILL — MONTELUKAST SOD 10 MG TAB: 10 | 90 days supply | Qty: 90 | Fill #0

## 2021-02-13 MED FILL — LEVOCETIRIZINE 5 MG TABLET: 5 | 90 days supply | Qty: 90 | Fill #0

## 2021-02-13 MED FILL — DILTIAZEM ER 240 MG TABLET: 240 | 30 days supply | Qty: 30 | Fill #0

## 2021-02-13 NOTE — Telephone Encounter (Signed)
No future appts scheduled. If he schedules one is this okay to refill with a 30 day supply?

## 2021-02-13 NOTE — Telephone Encounter (Signed)
OK to fill with 30 day as long as he has an appt. His next appt needs to be fasting for labwork as well.  You may want to let him know that he can get more refills through his PCP if they are willing to fill these. Thanks

## 2021-02-21 ENCOUNTER — Other Ambulatory Visit: Payer: Self-pay

## 2021-02-22 ENCOUNTER — Other Ambulatory Visit: Payer: Self-pay | Admitting: Family Medicine

## 2021-02-22 ENCOUNTER — Encounter: Payer: Self-pay | Admitting: Family Medicine

## 2021-02-22 ENCOUNTER — Ambulatory Visit (INDEPENDENT_AMBULATORY_CARE_PROVIDER_SITE_OTHER): Payer: 59 | Admitting: Family Medicine

## 2021-02-22 VITALS — BP 128/80 | HR 71 | Temp 98.1°F | Ht 69.0 in | Wt 300.0 lb

## 2021-02-22 DIAGNOSIS — I10 Essential (primary) hypertension: Secondary | ICD-10-CM | POA: Diagnosis not present

## 2021-02-22 DIAGNOSIS — Z0000 Encounter for general adult medical examination without abnormal findings: Secondary | ICD-10-CM

## 2021-02-22 LAB — CBC WITH DIFFERENTIAL/PLATELET
Basophils Absolute: 0.1 10*3/uL (ref 0.0–0.1)
Basophils Relative: 0.9 % (ref 0.0–3.0)
Eosinophils Absolute: 0.3 10*3/uL (ref 0.0–0.7)
Eosinophils Relative: 3.9 % (ref 0.0–5.0)
HCT: 45.4 % (ref 39.0–52.0)
Hemoglobin: 15 g/dL (ref 13.0–17.0)
Lymphocytes Relative: 41.5 % (ref 12.0–46.0)
Lymphs Abs: 2.7 10*3/uL (ref 0.7–4.0)
MCHC: 33.1 g/dL (ref 30.0–36.0)
MCV: 83.9 fl (ref 78.0–100.0)
Monocytes Absolute: 0.6 10*3/uL (ref 0.1–1.0)
Monocytes Relative: 8.5 % (ref 3.0–12.0)
Neutro Abs: 2.9 10*3/uL (ref 1.4–7.7)
Neutrophils Relative %: 45.2 % (ref 43.0–77.0)
Platelets: 296 10*3/uL (ref 150.0–400.0)
RBC: 5.41 Mil/uL (ref 4.22–5.81)
RDW: 14.6 % (ref 11.5–15.5)
WBC: 6.5 10*3/uL (ref 4.0–10.5)

## 2021-02-22 LAB — HEMOGLOBIN A1C: Hgb A1c MFr Bld: 5.4 % (ref 4.6–6.5)

## 2021-02-22 LAB — T4, FREE: Free T4: 0.73 ng/dL (ref 0.60–1.60)

## 2021-02-22 LAB — LIPID PANEL
Cholesterol: 191 mg/dL (ref 0–200)
HDL: 41.8 mg/dL (ref >39.00)
LDL Cholesterol: 134 mg/dL — ABNORMAL HIGH (ref 0–99)
NonHDL: 148.99
Total CHOL/HDL Ratio: 5
Triglycerides: 77 mg/dL (ref 0.0–149.0)
VLDL: 15.4 mg/dL (ref 0.0–40.0)

## 2021-02-22 LAB — URIC ACID: Uric Acid, Serum: 7.8 mg/dL (ref 4.0–7.8)

## 2021-02-22 LAB — HEPATIC FUNCTION PANEL
ALT: 26 U/L (ref 0–53)
AST: 15 U/L (ref 0–37)
Albumin: 4.2 g/dL (ref 3.5–5.2)
Alkaline Phosphatase: 61 U/L (ref 39–117)
Bilirubin, Direct: 0.1 mg/dL (ref 0.0–0.3)
Total Bilirubin: 0.4 mg/dL (ref 0.2–1.2)
Total Protein: 7.4 g/dL (ref 6.0–8.3)

## 2021-02-22 LAB — BASIC METABOLIC PANEL
BUN: 21 mg/dL (ref 6–23)
CO2: 28 mEq/L (ref 19–32)
Calcium: 9.3 mg/dL (ref 8.4–10.5)
Chloride: 105 mEq/L (ref 96–112)
Creatinine, Ser: 1.25 mg/dL (ref 0.40–1.50)
GFR: 68.47 mL/min (ref >60.00)
Glucose, Bld: 89 mg/dL (ref 70–99)
Potassium: 3.9 mEq/L (ref 3.5–5.1)
Sodium: 140 mEq/L (ref 135–145)

## 2021-02-22 LAB — VITAMIN D 25 HYDROXY (VIT D DEFICIENCY, FRACTURES): VITD: 52.17 ng/mL (ref 30.00–100.00)

## 2021-02-22 LAB — PSA: PSA: 0.18 ng/mL (ref 0.10–4.00)

## 2021-02-22 LAB — T3, FREE: T3, Free: 3.2 pg/mL (ref 2.3–4.2)

## 2021-02-22 LAB — TSH: TSH: 1.46 u[IU]/mL (ref 0.35–4.50)

## 2021-02-22 MED ORDER — DILTIAZEM HCL ER COATED BEADS 240 MG PO TB24
240.0000 mg | ORAL_TABLET | Freq: Every day | ORAL | 3 refills | Status: DC
  Filled 2021-03-20: qty 90, 90d supply, fill #0
  Filled 2021-07-02: qty 90, 90d supply, fill #1
  Filled 2021-10-23: qty 90, 90d supply, fill #2
  Filled 2022-01-30: qty 90, 90d supply, fill #3

## 2021-02-22 MED ORDER — AMPHETAMINE-DEXTROAMPHET ER 20 MG PO CP24: 20.0000 mg | ORAL_CAPSULE | ORAL | 0 refills | Status: DC

## 2021-02-22 MED ORDER — CHLORTHALIDONE 25 MG PO TABS
25.0000 mg | ORAL_TABLET | Freq: Every day | ORAL | 3 refills | Status: DC
  Filled 2021-03-20: qty 90, 90d supply, fill #0
  Filled 2021-07-02: qty 90, 90d supply, fill #1
  Filled 2021-10-23: qty 90, 90d supply, fill #2
  Filled 2022-01-30: qty 90, 90d supply, fill #3

## 2021-02-22 MED FILL — ADDERALL XR 20 MG CAP SA: 20 | 90 days supply | Qty: 90 | Fill #0

## 2021-02-22 NOTE — Progress Notes (Signed)
Subjective:    Patient ID: Daniel [name], male    DOB: Jan 30, 1971, 50 y.o.   MRN: [id number]  HPI Here for a well exam. He feels fine. He [facility] with [facility]. He is very pleased with the 20 mg dosage of the Adderall XR, and he would like to stay on this.    Review of Systems  Constitutional: Negative.   HENT: Negative.   Eyes: Negative.   Respiratory: Negative.   Cardiovascular: Negative.   Gastrointestinal: Negative.   Genitourinary: Negative.   Musculoskeletal: Negative.   Skin: Negative.   Neurological: Negative.   Psychiatric/Behavioral: Negative.        Objective:   Physical Exam Constitutional:      General: He is not in acute distress.    Appearance: He is well-developed. He is obese. He is not diaphoretic.  HENT:     Head: Normocephalic and atraumatic.     Right Ear: External ear normal.     Left Ear: External ear normal.     Nose: Nose normal.     Mouth/Throat:     Pharynx: No oropharyngeal exudate.  Eyes:     General: No scleral icterus.       Right eye: No discharge.        Left eye: No discharge.     Conjunctiva/sclera: Conjunctivae normal.     Pupils: Pupils are equal, round, and reactive to light.  Neck:     Thyroid: No thyromegaly.     Vascular: No JVD.     Trachea: No tracheal deviation.  Cardiovascular:     Rate and Rhythm: Normal rate and regular rhythm.     Heart sounds: Normal heart sounds. No murmur heard. No friction rub. No gallop.   Pulmonary:     Effort: Pulmonary effort is normal. No respiratory distress.     Breath sounds: Normal breath sounds. No wheezing or rales.  Chest:     Chest wall: No tenderness.  Abdominal:     General: Bowel sounds are normal. There is no distension.     Palpations: Abdomen is soft. There is no mass.     Tenderness: There is no abdominal tenderness. There is no guarding or rebound.  Genitourinary:    Penis: Normal. No tenderness.      Testes: Normal.     Prostate:  Normal.     Rectum: Normal. Guaiac result negative.  Musculoskeletal:        General: No tenderness. Normal range of motion.     Cervical back: Neck supple.  Lymphadenopathy:     Cervical: No cervical adenopathy.  Skin:    General: Skin is warm and dry.     Coloration: Skin is not pale.     Findings: No erythema or rash.  Neurological:     Mental Status: He is alert and oriented to person, place, and time.     Cranial Nerves: No cranial nerve deficit.     Motor: No abnormal muscle tone.     Coordination: Coordination normal.     Deep Tendon Reflexes: Reflexes are normal and symmetric. Reflexes normal.  Psychiatric:        Behavior: Behavior normal.        Thought Content: Thought content normal.        Judgment: Judgment normal.           Assessment & Plan:  Well exam. We discussed diet and exercise. Get fasting labs.  [name] Crane, MD

## 2021-02-23 LAB — INSULIN, RANDOM: Insulin: 14.2 u[IU]/mL

## 2021-03-07 ENCOUNTER — Ambulatory Visit (INDEPENDENT_AMBULATORY_CARE_PROVIDER_SITE_OTHER): Payer: 59 | Admitting: Physician Assistant

## 2021-03-16 ENCOUNTER — Other Ambulatory Visit: Payer: Self-pay

## 2021-03-19 ENCOUNTER — Other Ambulatory Visit (HOSPITAL_COMMUNITY): Payer: Self-pay

## 2021-03-20 ENCOUNTER — Other Ambulatory Visit (HOSPITAL_BASED_OUTPATIENT_CLINIC_OR_DEPARTMENT_OTHER): Payer: Self-pay

## 2021-03-21 ENCOUNTER — Other Ambulatory Visit (HOSPITAL_BASED_OUTPATIENT_CLINIC_OR_DEPARTMENT_OTHER): Payer: Self-pay

## 2021-03-21 DIAGNOSIS — R059 Cough, unspecified: Secondary | ICD-10-CM | POA: Diagnosis not present

## 2021-03-21 DIAGNOSIS — Z20828 Contact with and (suspected) exposure to other viral communicable diseases: Secondary | ICD-10-CM | POA: Diagnosis not present

## 2021-03-21 DIAGNOSIS — J3089 Other allergic rhinitis: Secondary | ICD-10-CM | POA: Diagnosis not present

## 2021-03-22 ENCOUNTER — Other Ambulatory Visit (HOSPITAL_BASED_OUTPATIENT_CLINIC_OR_DEPARTMENT_OTHER): Payer: Self-pay

## 2021-03-22 MED ORDER — CARESTART COVID-19 HOME TEST VI KIT
PACK | 0 refills | Status: DC
  Filled 2021-03-22: qty 4, 4d supply, fill #0

## 2021-03-27 DIAGNOSIS — M542 Cervicalgia: Secondary | ICD-10-CM | POA: Diagnosis not present

## 2021-03-30 DIAGNOSIS — M542 Cervicalgia: Secondary | ICD-10-CM | POA: Diagnosis not present

## 2021-04-06 DIAGNOSIS — M542 Cervicalgia: Secondary | ICD-10-CM | POA: Diagnosis not present

## 2021-04-13 DIAGNOSIS — M542 Cervicalgia: Secondary | ICD-10-CM | POA: Diagnosis not present

## 2021-04-27 DIAGNOSIS — M542 Cervicalgia: Secondary | ICD-10-CM | POA: Diagnosis not present

## 2021-05-04 DIAGNOSIS — M542 Cervicalgia: Secondary | ICD-10-CM | POA: Diagnosis not present

## 2021-05-11 ENCOUNTER — Other Ambulatory Visit (HOSPITAL_COMMUNITY): Payer: Self-pay

## 2021-05-11 ENCOUNTER — Other Ambulatory Visit (INDEPENDENT_AMBULATORY_CARE_PROVIDER_SITE_OTHER): Payer: Self-pay | Admitting: Family Medicine

## 2021-05-11 MED ORDER — MONTELUKAST SODIUM 10 MG PO TABS
10.0000 mg | ORAL_TABLET | Freq: Every day | ORAL | 0 refills | Status: DC
  Filled 2021-05-11 – 2021-05-16 (×2): qty 90, 90d supply, fill #0

## 2021-05-11 MED ORDER — LEVOCETIRIZINE DIHYDROCHLORIDE 5 MG PO TABS
5.0000 mg | ORAL_TABLET | Freq: Every evening | ORAL | 0 refills | Status: DC
  Filled 2021-05-11 – 2021-05-16 (×2): qty 90, 90d supply, fill #0

## 2021-05-16 ENCOUNTER — Other Ambulatory Visit (HOSPITAL_BASED_OUTPATIENT_CLINIC_OR_DEPARTMENT_OTHER): Payer: Self-pay

## 2021-05-16 ENCOUNTER — Ambulatory Visit: Payer: 59 | Attending: Internal Medicine

## 2021-05-16 DIAGNOSIS — Z23 Encounter for immunization: Secondary | ICD-10-CM

## 2021-05-16 NOTE — Progress Notes (Signed)
   Covid-19 Vaccination Clinic  Name:  Daniel [name]    MRN: [id number] DOB: 09-16-1971  05/16/2021  Mr. Daniel [name] was observed post Covid-19 immunization for 15 minutes without incident. He was provided with Vaccine Information Sheet and instruction to access the V-Safe system.   Mr. Daniel [name] was instructed to call 911 with any severe reactions post vaccine: Difficulty breathing  Swelling of face and throat  A fast heartbeat  A bad rash all over body  Dizziness and weakness   Immunizations Administered     Name Date Dose VIS Date Route   Moderna Covid-19 Booster Vaccine 05/16/2021  1:32 PM 0.25 mL 09/13/2020 Intramuscular   Manufacturer: Moderna   Lot: [id number]   NDC: 50158-682-57

## 2021-05-17 ENCOUNTER — Other Ambulatory Visit (HOSPITAL_BASED_OUTPATIENT_CLINIC_OR_DEPARTMENT_OTHER): Payer: Self-pay

## 2021-05-17 MED ORDER — COVID-19 MRNA VACC (MODERNA) 100 MCG/0.5ML IM SUSP
INTRAMUSCULAR | 0 refills | Status: AC
  Filled 2021-05-17: qty 0.25, 1d supply, fill #0

## 2021-05-22 ENCOUNTER — Other Ambulatory Visit (HOSPITAL_COMMUNITY): Payer: Self-pay

## 2021-07-02 ENCOUNTER — Other Ambulatory Visit: Payer: Self-pay | Admitting: Family Medicine

## 2021-07-02 ENCOUNTER — Other Ambulatory Visit (HOSPITAL_COMMUNITY): Payer: Self-pay

## 2021-07-02 ENCOUNTER — Other Ambulatory Visit (HOSPITAL_BASED_OUTPATIENT_CLINIC_OR_DEPARTMENT_OTHER): Payer: Self-pay

## 2021-07-02 MED ORDER — AMPHETAMINE-DEXTROAMPHET ER 20 MG PO CP24
20.0000 mg | ORAL_CAPSULE | Freq: Every morning | ORAL | 0 refills | Status: DC
  Filled 2021-07-02 – 2021-07-19 (×2): qty 90, 90d supply, fill #0

## 2021-07-02 NOTE — Telephone Encounter (Signed)
Pt LOV was on 02/22/2021 Last refill was done on 02/22/2021 Please advise

## 2021-07-02 NOTE — Telephone Encounter (Signed)
Done

## 2021-07-03 ENCOUNTER — Other Ambulatory Visit (HOSPITAL_BASED_OUTPATIENT_CLINIC_OR_DEPARTMENT_OTHER): Payer: Self-pay

## 2021-07-03 ENCOUNTER — Other Ambulatory Visit (HOSPITAL_COMMUNITY): Payer: Self-pay

## 2021-07-04 ENCOUNTER — Other Ambulatory Visit (HOSPITAL_BASED_OUTPATIENT_CLINIC_OR_DEPARTMENT_OTHER): Payer: Self-pay

## 2021-07-12 ENCOUNTER — Other Ambulatory Visit (HOSPITAL_COMMUNITY): Payer: Self-pay

## 2021-07-18 ENCOUNTER — Other Ambulatory Visit (HOSPITAL_COMMUNITY): Payer: Self-pay

## 2021-07-19 ENCOUNTER — Other Ambulatory Visit (HOSPITAL_COMMUNITY): Payer: Self-pay

## 2021-08-15 ENCOUNTER — Other Ambulatory Visit (INDEPENDENT_AMBULATORY_CARE_PROVIDER_SITE_OTHER): Payer: Self-pay | Admitting: Family Medicine

## 2021-08-15 ENCOUNTER — Other Ambulatory Visit (HOSPITAL_COMMUNITY): Payer: Self-pay

## 2021-08-15 MED ORDER — LEVOCETIRIZINE DIHYDROCHLORIDE 5 MG PO TABS
5.0000 mg | ORAL_TABLET | Freq: Every evening | ORAL | 0 refills | Status: DC
  Filled 2021-08-15 – 2021-08-20 (×2): qty 90, 90d supply, fill #0

## 2021-08-15 MED ORDER — MONTELUKAST SODIUM 10 MG PO TABS
10.0000 mg | ORAL_TABLET | Freq: Every day | ORAL | 0 refills | Status: DC
  Filled 2021-08-15 – 2021-08-20 (×2): qty 90, 90d supply, fill #0

## 2021-08-20 ENCOUNTER — Other Ambulatory Visit (HOSPITAL_BASED_OUTPATIENT_CLINIC_OR_DEPARTMENT_OTHER): Payer: Self-pay

## 2021-08-20 ENCOUNTER — Other Ambulatory Visit (HOSPITAL_COMMUNITY): Payer: Self-pay

## 2021-10-24 ENCOUNTER — Other Ambulatory Visit (HOSPITAL_BASED_OUTPATIENT_CLINIC_OR_DEPARTMENT_OTHER): Payer: Self-pay

## 2021-10-25 ENCOUNTER — Other Ambulatory Visit (HOSPITAL_BASED_OUTPATIENT_CLINIC_OR_DEPARTMENT_OTHER): Payer: Self-pay

## 2021-10-29 ENCOUNTER — Other Ambulatory Visit: Payer: Self-pay

## 2021-11-19 ENCOUNTER — Other Ambulatory Visit (INDEPENDENT_AMBULATORY_CARE_PROVIDER_SITE_OTHER): Payer: Self-pay | Admitting: Family Medicine

## 2021-11-20 ENCOUNTER — Other Ambulatory Visit (HOSPITAL_BASED_OUTPATIENT_CLINIC_OR_DEPARTMENT_OTHER): Payer: Self-pay

## 2021-11-21 ENCOUNTER — Other Ambulatory Visit (HOSPITAL_COMMUNITY): Payer: Self-pay

## 2021-11-21 ENCOUNTER — Other Ambulatory Visit: Payer: Self-pay

## 2021-11-21 ENCOUNTER — Encounter: Payer: Self-pay | Admitting: Family Medicine

## 2021-11-21 MED ORDER — LEVOCETIRIZINE DIHYDROCHLORIDE 5 MG PO TABS
5.0000 mg | ORAL_TABLET | Freq: Every evening | ORAL | 0 refills | Status: DC
  Filled 2021-11-21: qty 90, 90d supply, fill #0

## 2021-11-21 MED ORDER — MONTELUKAST SODIUM 10 MG PO TABS
10.0000 mg | ORAL_TABLET | Freq: Every day | ORAL | 0 refills | Status: DC
  Filled 2021-11-21: qty 90, 90d supply, fill #0

## 2021-11-21 NOTE — Telephone Encounter (Signed)
Ok to send this Rx to pt requested pharmacy in [place]

## 2021-11-22 ENCOUNTER — Other Ambulatory Visit: Payer: Self-pay

## 2021-11-22 ENCOUNTER — Other Ambulatory Visit (HOSPITAL_COMMUNITY): Payer: Self-pay

## 2021-11-22 DIAGNOSIS — R0602 Shortness of breath: Secondary | ICD-10-CM

## 2021-11-22 MED ORDER — LEVOCETIRIZINE DIHYDROCHLORIDE 5 MG PO TABS: 5.0000 mg | ORAL_TABLET | Freq: Every evening | ORAL | 0 refills | Status: DC

## 2021-11-22 MED ORDER — ALBUTEROL SULFATE HFA 108 (90 BASE) MCG/ACT IN AERS: INHALATION_SPRAY | RESPIRATORY_TRACT | 0 refills | Status: DC

## 2021-11-22 MED ORDER — MONTELUKAST SODIUM 10 MG PO TABS: 10.0000 mg | ORAL_TABLET | Freq: Every day | ORAL | 0 refills | Status: DC

## 2021-11-22 NOTE — Telephone Encounter (Signed)
Yes, please call in a 10 day supply of these to the pharmacy

## 2021-11-29 ENCOUNTER — Other Ambulatory Visit (INDEPENDENT_AMBULATORY_CARE_PROVIDER_SITE_OTHER): Payer: Self-pay | Admitting: Family Medicine

## 2021-11-29 ENCOUNTER — Other Ambulatory Visit (HOSPITAL_COMMUNITY): Payer: Self-pay

## 2021-11-29 MED ORDER — LEVOCETIRIZINE DIHYDROCHLORIDE 5 MG PO TABS
5.0000 mg | ORAL_TABLET | Freq: Every evening | ORAL | 0 refills | Status: DC
  Filled 2021-11-29: qty 90, 90d supply, fill #0

## 2021-11-30 ENCOUNTER — Other Ambulatory Visit (HOSPITAL_COMMUNITY): Payer: Self-pay

## 2021-11-30 ENCOUNTER — Other Ambulatory Visit (INDEPENDENT_AMBULATORY_CARE_PROVIDER_SITE_OTHER): Payer: Self-pay | Admitting: Family Medicine

## 2021-11-30 MED ORDER — MONTELUKAST SODIUM 10 MG PO TABS
10.0000 mg | ORAL_TABLET | Freq: Every day | ORAL | 0 refills | Status: DC
  Filled 2021-11-30: qty 90, 90d supply, fill #0

## 2021-12-14 ENCOUNTER — Other Ambulatory Visit (HOSPITAL_COMMUNITY): Payer: Self-pay

## 2021-12-14 MED ORDER — CARESTART COVID-19 HOME TEST VI KIT
PACK | 0 refills | Status: DC
  Filled 2021-12-14: qty 4, 4d supply, fill #0

## 2021-12-25 ENCOUNTER — Other Ambulatory Visit (HOSPITAL_COMMUNITY): Payer: Self-pay

## 2022-01-30 ENCOUNTER — Other Ambulatory Visit (HOSPITAL_BASED_OUTPATIENT_CLINIC_OR_DEPARTMENT_OTHER): Payer: Self-pay

## 2022-01-30 ENCOUNTER — Other Ambulatory Visit: Payer: Self-pay | Admitting: Family Medicine

## 2022-01-30 DIAGNOSIS — R0602 Shortness of breath: Secondary | ICD-10-CM

## 2022-01-30 MED ORDER — AMPHETAMINE-DEXTROAMPHET ER 20 MG PO CP24
20.0000 mg | ORAL_CAPSULE | Freq: Every morning | ORAL | 0 refills | Status: DC
  Filled 2022-01-30: qty 90, 90d supply, fill #0

## 2022-01-30 MED ORDER — ALBUTEROL SULFATE HFA 108 (90 BASE) MCG/ACT IN AERS
INHALATION_SPRAY | RESPIRATORY_TRACT | 0 refills | Status: DC
  Filled 2022-01-30: qty 18, 17d supply, fill #0

## 2022-01-30 MED ORDER — OMEPRAZOLE 20 MG PO CPDR
DELAYED_RELEASE_CAPSULE | Freq: Every day | ORAL | 3 refills | Status: AC
  Filled 2022-01-30: qty 90, 90d supply, fill #0
  Filled 2022-05-21: qty 30, 30d supply, fill #1

## 2022-01-30 NOTE — Telephone Encounter (Signed)
Pt LOV  was 02/22/2021 ?Last refill for Adderall was on 07/02/2021 ?Please advise ?

## 2022-01-30 NOTE — Telephone Encounter (Signed)
Done

## 2022-02-18 ENCOUNTER — Other Ambulatory Visit (INDEPENDENT_AMBULATORY_CARE_PROVIDER_SITE_OTHER): Payer: Self-pay | Admitting: Family Medicine

## 2022-02-18 ENCOUNTER — Other Ambulatory Visit (HOSPITAL_BASED_OUTPATIENT_CLINIC_OR_DEPARTMENT_OTHER): Payer: Self-pay

## 2022-02-18 MED ORDER — LEVOCETIRIZINE DIHYDROCHLORIDE 5 MG PO TABS
5.0000 mg | ORAL_TABLET | Freq: Every evening | ORAL | 0 refills | Status: DC
  Filled 2022-02-18: qty 90, 90d supply, fill #0

## 2022-02-18 MED ORDER — MONTELUKAST SODIUM 10 MG PO TABS
10.0000 mg | ORAL_TABLET | Freq: Every day | ORAL | 0 refills | Status: DC
  Filled 2022-02-18: qty 90, 90d supply, fill #0

## 2022-02-19 DIAGNOSIS — H524 Presbyopia: Secondary | ICD-10-CM | POA: Diagnosis not present

## 2022-02-25 ENCOUNTER — Encounter: Payer: 59 | Admitting: Family Medicine

## 2022-05-21 ENCOUNTER — Other Ambulatory Visit (INDEPENDENT_AMBULATORY_CARE_PROVIDER_SITE_OTHER): Payer: Self-pay | Admitting: Family Medicine

## 2022-05-21 ENCOUNTER — Other Ambulatory Visit: Payer: Self-pay | Admitting: Family Medicine

## 2022-05-21 ENCOUNTER — Other Ambulatory Visit (HOSPITAL_BASED_OUTPATIENT_CLINIC_OR_DEPARTMENT_OTHER): Payer: Self-pay

## 2022-05-21 ENCOUNTER — Encounter: Payer: Self-pay | Admitting: Family Medicine

## 2022-05-21 DIAGNOSIS — I10 Essential (primary) hypertension: Secondary | ICD-10-CM

## 2022-05-21 DIAGNOSIS — R0602 Shortness of breath: Secondary | ICD-10-CM

## 2022-05-21 MED ORDER — MONTELUKAST SODIUM 10 MG PO TABS: 10.0000 mg | ORAL_TABLET | Freq: Every day | ORAL | 0 refills | Status: DC

## 2022-05-21 MED ORDER — LEVOCETIRIZINE DIHYDROCHLORIDE 5 MG PO TABS: 5.0000 mg | ORAL_TABLET | Freq: Every evening | ORAL | 0 refills | Status: DC

## 2022-05-22 ENCOUNTER — Other Ambulatory Visit (HOSPITAL_BASED_OUTPATIENT_CLINIC_OR_DEPARTMENT_OTHER): Payer: Self-pay

## 2022-05-24 ENCOUNTER — Other Ambulatory Visit: Payer: Self-pay | Admitting: Family Medicine

## 2022-05-24 DIAGNOSIS — I10 Essential (primary) hypertension: Secondary | ICD-10-CM

## 2022-05-24 MED ORDER — CHLORTHALIDONE 25 MG PO TABS: 25.0000 mg | ORAL_TABLET | Freq: Every day | ORAL | 0 refills | Status: DC

## 2022-05-24 MED ORDER — DILTIAZEM HCL ER COATED BEADS 240 MG PO TB24: 240.0000 mg | ORAL_TABLET | Freq: Every day | ORAL | 0 refills | Status: DC

## 2022-05-24 NOTE — Telephone Encounter (Signed)
See message from Pharmacy

## 2022-05-29 ENCOUNTER — Other Ambulatory Visit (HOSPITAL_BASED_OUTPATIENT_CLINIC_OR_DEPARTMENT_OTHER): Payer: Self-pay

## 2022-06-28 ENCOUNTER — Encounter: Payer: Self-pay | Admitting: Family Medicine

## 2022-06-28 ENCOUNTER — Ambulatory Visit (INDEPENDENT_AMBULATORY_CARE_PROVIDER_SITE_OTHER): Payer: Self-pay | Admitting: Family Medicine

## 2022-06-28 VITALS — BP 110/76 | HR 76 | Temp 97.9°F | Ht 68.0 in | Wt 270.0 lb

## 2022-06-28 DIAGNOSIS — Z0000 Encounter for general adult medical examination without abnormal findings: Secondary | ICD-10-CM

## 2022-06-28 DIAGNOSIS — I10 Essential (primary) hypertension: Secondary | ICD-10-CM

## 2022-06-28 DIAGNOSIS — M7989 Other specified soft tissue disorders: Secondary | ICD-10-CM

## 2022-06-28 DIAGNOSIS — M1A9XX0 Chronic gout, unspecified, without tophus (tophi): Secondary | ICD-10-CM

## 2022-06-28 DIAGNOSIS — M109 Gout, unspecified: Secondary | ICD-10-CM | POA: Insufficient documentation

## 2022-06-28 LAB — LIPID PANEL
Cholesterol: 211 mg/dL — ABNORMAL HIGH (ref 0–200)
HDL: 36.7 mg/dL — ABNORMAL LOW (ref >39.00)
LDL Cholesterol: 157 mg/dL — ABNORMAL HIGH (ref 0–99)
NonHDL: 174.07
Total CHOL/HDL Ratio: 6
Triglycerides: 84 mg/dL (ref 0.0–149.0)
VLDL: 16.8 mg/dL (ref 0.0–40.0)

## 2022-06-28 LAB — CBC WITH DIFFERENTIAL/PLATELET
Basophils Absolute: 0.1 10*3/uL (ref 0.0–0.1)
Basophils Relative: 1.1 % (ref 0.0–3.0)
Eosinophils Absolute: 0.3 10*3/uL (ref 0.0–0.7)
Eosinophils Relative: 4.7 % (ref 0.0–5.0)
HCT: 46.7 % (ref 39.0–52.0)
Hemoglobin: 15.5 g/dL (ref 13.0–17.0)
Lymphocytes Relative: 40.2 % (ref 12.0–46.0)
Lymphs Abs: 2.5 10*3/uL (ref 0.7–4.0)
MCHC: 33.3 g/dL (ref 30.0–36.0)
MCV: 84 fl (ref 78.0–100.0)
Monocytes Absolute: 0.6 10*3/uL (ref 0.1–1.0)
Monocytes Relative: 8.9 % (ref 3.0–12.0)
Neutro Abs: 2.8 10*3/uL (ref 1.4–7.7)
Neutrophils Relative %: 45.1 % (ref 43.0–77.0)
Platelets: 327 10*3/uL (ref 150.0–400.0)
RBC: 5.56 Mil/uL (ref 4.22–5.81)
RDW: 13.9 % (ref 11.5–15.5)
WBC: 6.2 10*3/uL (ref 4.0–10.5)

## 2022-06-28 LAB — PSA: PSA: 0.14 ng/mL (ref 0.10–4.00)

## 2022-06-28 LAB — BASIC METABOLIC PANEL
BUN: 14 mg/dL (ref 6–23)
CO2: 30 mEq/L (ref 19–32)
Calcium: 9.6 mg/dL (ref 8.4–10.5)
Chloride: 101 mEq/L (ref 96–112)
Creatinine, Ser: 1.18 mg/dL (ref 0.40–1.50)
GFR: 72.68 mL/min (ref >60.00)
Glucose, Bld: 83 mg/dL (ref 70–99)
Potassium: 3.4 mEq/L — ABNORMAL LOW (ref 3.5–5.1)
Sodium: 139 mEq/L (ref 135–145)

## 2022-06-28 LAB — TSH: TSH: 1.54 u[IU]/mL (ref 0.35–5.50)

## 2022-06-28 LAB — HEPATIC FUNCTION PANEL
ALT: 26 U/L (ref 0–53)
AST: 18 U/L (ref 0–37)
Albumin: 4.4 g/dL (ref 3.5–5.2)
Alkaline Phosphatase: 56 U/L (ref 39–117)
Bilirubin, Direct: 0.1 mg/dL (ref 0.0–0.3)
Total Bilirubin: 0.4 mg/dL (ref 0.2–1.2)
Total Protein: 7.8 g/dL (ref 6.0–8.3)

## 2022-06-28 LAB — URIC ACID: Uric Acid, Serum: 10.1 mg/dL — ABNORMAL HIGH (ref 4.0–7.8)

## 2022-06-28 LAB — HEMOGLOBIN A1C: Hgb A1c MFr Bld: 5.6 % (ref 4.6–6.5)

## 2022-06-28 MED ORDER — MONTELUKAST SODIUM 10 MG PO TABS: 10.0000 mg | ORAL_TABLET | Freq: Every day | ORAL | 3 refills | Status: DC

## 2022-06-28 MED ORDER — LEVOCETIRIZINE DIHYDROCHLORIDE 5 MG PO TABS
5.0000 mg | ORAL_TABLET | Freq: Every evening | ORAL | 3 refills | Status: DC
Start: 2022-06-28 — End: 2023-12-11

## 2022-06-28 MED ORDER — CHLORTHALIDONE 25 MG PO TABS: 25.0000 mg | ORAL_TABLET | Freq: Every day | ORAL | 3 refills | Status: DC

## 2022-06-28 NOTE — Progress Notes (Signed)
Subjective:    Patient ID: Daniel [name], male    DOB: [birth year], 51 y.o.   MRN: [id number]  HPI Here for a well exam. He feels well in general, but he does ask about intermittent swelling in the left foot and lower leg. This started about 3 months ago. This only occurs when he sits for long periods of time. He had a gout attack in the left ankle 3 months ago, and this was the first he had had for several years.   Review of Systems  Constitutional: Negative.   HENT: Negative.    Eyes: Negative.   Respiratory: Negative.    Cardiovascular:  Positive for leg swelling.  Gastrointestinal: Negative.   Genitourinary: Negative.   Musculoskeletal: Negative.   Skin: Negative.   Neurological: Negative.   Psychiatric/Behavioral: Negative.         Objective:   Physical Exam Constitutional:      General: He is not in acute distress.    Appearance: He is well-developed. He is obese. He is not diaphoretic.  HENT:     Head: Normocephalic and atraumatic.     Right Ear: External ear normal.     Left Ear: External ear normal.     Nose: Nose normal.     Mouth/Throat:     Pharynx: No oropharyngeal exudate.  Eyes:     General: No scleral icterus.       Right eye: No discharge.        Left eye: No discharge.     Conjunctiva/sclera: Conjunctivae normal.     Pupils: Pupils are equal, round, and reactive to light.  Neck:     Thyroid: No thyromegaly.     Vascular: No JVD.     Trachea: No tracheal deviation.  Cardiovascular:     Rate and Rhythm: Normal rate and regular rhythm.     Heart sounds: Normal heart sounds. No murmur heard.    No friction rub. No gallop.  Pulmonary:     Effort: Pulmonary effort is normal. No respiratory distress.     Breath sounds: Normal breath sounds. No wheezing or rales.  Chest:     Chest wall: No tenderness.  Abdominal:     General: Bowel sounds are normal. There is no distension.     Palpations: Abdomen is soft. There is no mass.     Tenderness: There is  no abdominal tenderness. There is no guarding or rebound.  Genitourinary:    Penis: Normal. No tenderness.      Testes: Normal.     Prostate: Normal.     Rectum: Normal. Guaiac result negative.  Musculoskeletal:        General: No tenderness. Normal range of motion.     Cervical back: Neck supple.     Right lower leg: No edema.     Left lower leg: No edema.  Lymphadenopathy:     Cervical: No cervical adenopathy.  Skin:    General: Skin is warm and dry.     Coloration: Skin is not pale.     Findings: No erythema or rash.  Neurological:     Mental Status: He is alert and oriented to person, place, and time.     Cranial Nerves: No cranial nerve deficit.     Motor: No abnormal muscle tone.     Coordination: Coordination normal.     Deep Tendon Reflexes: Reflexes are normal and symmetric. Reflexes normal.  Psychiatric:        Behavior: Behavior  normal.        Thought Content: Thought content normal.        Judgment: Judgment normal.           Assessment & Plan:  Well exam. We discussed diet and exercise. Get fasting labs. Check a uric acid level. We will set up an [name] on the left leg for the swelling he described.  [name] Crane, MD

## 2022-07-01 ENCOUNTER — Telehealth: Payer: Self-pay

## 2022-07-01 ENCOUNTER — Ambulatory Visit (HOSPITAL_COMMUNITY)
Admission: RE | Admit: 2022-07-01 | Discharge: 2022-07-01 | Disposition: A | Discharge status: Home / Self Care | Payer: 59 | Source: Ambulatory Visit | Attending: Family Medicine | Admitting: Family Medicine

## 2022-07-01 DIAGNOSIS — M7989 Other specified soft tissue disorders: Secondary | ICD-10-CM | POA: Insufficient documentation

## 2022-07-01 NOTE — Telephone Encounter (Signed)
Message was sent to PCP for review

## 2022-07-02 ENCOUNTER — Telehealth: Payer: Self-pay | Admitting: Family Medicine

## 2022-07-02 NOTE — Telephone Encounter (Signed)
Called patient, lvm for patient to return call to office, patient reviewed imaging  results on My Chart.

## 2022-07-02 NOTE — Telephone Encounter (Signed)
Patient states that he was returning CMA's call. I let them know that CMA was in a meeting and I would let them know that he had called back.       Please advise

## 2022-07-02 NOTE — Telephone Encounter (Signed)
Noted  

## 2022-07-02 NOTE — Telephone Encounter (Signed)
Pt stated CMA called him and he is still waiting for CMA to return his phone call.

## 2022-07-03 ENCOUNTER — Encounter (INDEPENDENT_AMBULATORY_CARE_PROVIDER_SITE_OTHER): Payer: Self-pay

## 2022-07-03 ENCOUNTER — Other Ambulatory Visit: Payer: Self-pay

## 2022-07-03 DIAGNOSIS — M1A9XX0 Chronic gout, unspecified, without tophus (tophi): Secondary | ICD-10-CM

## 2022-07-03 DIAGNOSIS — E7849 Other hyperlipidemia: Secondary | ICD-10-CM

## 2022-07-03 MED ORDER — POTASSIUM CHLORIDE ER 10 MEQ PO TBCR: 10.0000 meq | EXTENDED_RELEASE_TABLET | Freq: Every day | ORAL | 3 refills | Status: DC

## 2022-07-03 MED ORDER — ATORVASTATIN CALCIUM 10 MG PO TABS: 10.0000 mg | ORAL_TABLET | Freq: Every day | ORAL | 3 refills | Status: DC

## 2022-07-03 MED ORDER — ALLOPURINOL 100 MG PO TABS: 100.0000 mg | ORAL_TABLET | Freq: Every day | ORAL | 3 refills | Status: DC

## 2022-07-04 ENCOUNTER — Encounter: Payer: Self-pay | Admitting: Family Medicine

## 2022-07-05 NOTE — Telephone Encounter (Signed)
Adderall has no effect on potassium levels. The Chlorthalidone (like most diuretics) can cause potassium levels to drop. That's why we often prescribe potassium along with them to keep the level up

## 2022-07-24 ENCOUNTER — Encounter: Payer: Self-pay | Admitting: Family Medicine

## 2022-07-30 NOTE — Telephone Encounter (Signed)
The form is ready (in your folder)

## 2022-08-08 ENCOUNTER — Other Ambulatory Visit: Payer: Self-pay | Admitting: Family Medicine

## 2022-08-08 NOTE — Telephone Encounter (Signed)
Last OV CPE-06/28/22 Last refill- 01/30/22-90 tabs, 0 refills  No future OV scheduled.

## 2022-08-09 ENCOUNTER — Other Ambulatory Visit (HOSPITAL_BASED_OUTPATIENT_CLINIC_OR_DEPARTMENT_OTHER): Payer: Self-pay

## 2022-08-09 MED ORDER — AMPHETAMINE-DEXTROAMPHET ER 20 MG PO CP24: 20.0000 mg | ORAL_CAPSULE | Freq: Every morning | ORAL | 0 refills | Status: DC

## 2022-08-09 NOTE — Telephone Encounter (Signed)
Done

## 2022-10-07 ENCOUNTER — Other Ambulatory Visit: Payer: 59

## 2022-10-07 DIAGNOSIS — E7849 Other hyperlipidemia: Secondary | ICD-10-CM

## 2022-10-07 DIAGNOSIS — M1A9XX0 Chronic gout, unspecified, without tophus (tophi): Secondary | ICD-10-CM

## 2022-10-07 LAB — LIPID PANEL
Cholesterol: 186 mg/dL (ref 0–200)
HDL: 41.7 mg/dL (ref >39.00)
LDL Cholesterol: 127 mg/dL — ABNORMAL HIGH (ref 0–99)
NonHDL: 143.8
Total CHOL/HDL Ratio: 4
Triglycerides: 83 mg/dL (ref 0.0–149.0)
VLDL: 16.6 mg/dL (ref 0.0–40.0)

## 2022-10-07 LAB — BASIC METABOLIC PANEL
BUN: 17 mg/dL (ref 6–23)
CO2: 28 mEq/L (ref 19–32)
Calcium: 9.2 mg/dL (ref 8.4–10.5)
Chloride: 106 mEq/L (ref 96–112)
Creatinine, Ser: 1.07 mg/dL (ref 0.40–1.50)
GFR: 81.58 mL/min (ref >60.00)
Glucose, Bld: 87 mg/dL (ref 70–99)
Potassium: 4 mEq/L (ref 3.5–5.1)
Sodium: 141 mEq/L (ref 135–145)

## 2022-10-07 LAB — HEPATIC FUNCTION PANEL
ALT: 25 U/L (ref 0–53)
AST: 14 U/L (ref 0–37)
Albumin: 4.1 g/dL (ref 3.5–5.2)
Alkaline Phosphatase: 68 U/L (ref 39–117)
Bilirubin, Direct: 0 mg/dL (ref 0.0–0.3)
Total Bilirubin: 0.3 mg/dL (ref 0.2–1.2)
Total Protein: 7.5 g/dL (ref 6.0–8.3)

## 2022-10-07 NOTE — Addendum Note (Signed)
Addended by: Elita Boone E on: 10/07/2022 09:25 AM   Modules accepted: Orders

## 2022-10-12 ENCOUNTER — Encounter (HOSPITAL_BASED_OUTPATIENT_CLINIC_OR_DEPARTMENT_OTHER): Payer: Self-pay | Admitting: Emergency Medicine

## 2022-10-12 ENCOUNTER — Emergency Department (HOSPITAL_BASED_OUTPATIENT_CLINIC_OR_DEPARTMENT_OTHER)
Admission: EM | Admit: 2022-10-12 | Discharge: 2022-10-12 | Disposition: A | Discharge status: Home / Self Care | Payer: 59 | Attending: Emergency Medicine | Admitting: Emergency Medicine

## 2022-10-12 ENCOUNTER — Emergency Department (HOSPITAL_BASED_OUTPATIENT_CLINIC_OR_DEPARTMENT_OTHER): Payer: 59

## 2022-10-12 DIAGNOSIS — M79672 Pain in left foot: Secondary | ICD-10-CM | POA: Diagnosis present

## 2022-10-12 MED ORDER — NAPROXEN 250 MG PO TABS
500.0000 mg | ORAL_TABLET | Freq: Once | ORAL | Status: AC
  Administered 2022-10-12: 500 mg via ORAL
  Filled 2022-10-12: qty 2

## 2022-10-12 MED ORDER — NAPROXEN 500 MG PO TABS: 500.0000 mg | ORAL_TABLET | Freq: Two times a day (BID) | ORAL | 0 refills | Status: DC

## 2022-10-12 MED ORDER — ACETAMINOPHEN 500 MG PO TABS: 500.0000 mg | ORAL_TABLET | Freq: Four times a day (QID) | ORAL | 0 refills | Status: AC | PRN

## 2022-10-12 NOTE — Discharge Instructions (Addendum)
You presented today for left foot pain.  Your x-ray shows some swelling but there are are no fractures.  As we discussed, when you are resting keep your foot elevated and iced.  When you are out and about make sure to wear your ankle brace.  Tylenol and ibuprofen are good options for pain and swelling.  Naproxen can also be used instead of ibuprofen.  I have sent prescriptions to your pharmacy in the event that you do not have larger pills at home.  Follow-up with your primary care in 1 week if nothing is getting better.

## 2022-10-12 NOTE — ED Triage Notes (Signed)
Pt c/o LT foot pain (outer) since yesterday; no injury, but foot slipped off machine at gym on Tues; hx of gout

## 2022-10-12 NOTE — ED Provider Notes (Signed)
[facility] EMERGENCY DEPARTMENT Provider Note   CSN: [id number] Arrival date & time: 10/12/22  1533     History  Chief Complaint  Patient presents with   Foot Pain    Daniel [name] is a 51 y.o. male complaining of left foot pain since Tuesday.  Reports he was at the gym and his foot slid off of the leg press.  He did not have any pain on Tuesday on Wednesday but started to have some discomfort on Thursday and noted his foot starting to swell.  He is able to bear weight but is walking with a limp.  No numbness or tingling.  History of gout but this does not feel the same.   Foot Pain       Home Medications Prior to Admission medications   Medication Sig Start Date End Date Taking? Authorizing Provider  albuterol (VENTOLIN HFA) 108 (90 Base) MCG/ACT inhaler INHALE 2 PUFFS INTO THE LUNGS EVERY 4 HOURS AS NEEDED FOR WHEEZING OR SHORTNESS OF BREATH. 01/30/22   Laurey Morale, MD  allopurinol (ZYLOPRIM) 100 MG tablet Take 1 tablet (100 mg total) by mouth daily. 07/03/22   Laurey Morale, MD  amphetamine-dextroamphetamine (ADDERALL XR) 20 MG 24 hr capsule Take 1 capsule (20 mg total) by mouth every morning. 08/09/22 11/07/22  Laurey Morale, MD  atorvastatin (LIPITOR) 10 MG tablet Take 1 tablet (10 mg total) by mouth daily. 07/03/22   Laurey Morale, MD  chlorthalidone (HYGROTON) 25 MG tablet Take 1 tablet (25 mg total) by mouth daily. 06/28/22   Laurey Morale, MD  Colchicine 0.6 MG CAPS Take 1 tablet by mouth 2 (two) times daily as needed. 05/09/20   Hilts, Legrand Como, MD  COVID-19 At Home Antigen Test [facility] COVID-19 HOME TEST) KIT Use as directed within package instructions 03/22/21   Clementeen Graham, [facility]  COVID-19 At Home Antigen Test [facility] COVID-19 HOME TEST) KIT Use as directed 12/14/21   Jefm Bryant, RPH  COVID-19 mRNA vaccine, Moderna, 100 MCG/0.5ML injection Inject into the muscle. 05/16/21   Carlyle Basques, MD  diltiazem (TIADYLT ER) 240 MG 24 hr capsule Take 1  capsule (240 mg total) by mouth daily. 05/27/22   Laurey Morale, MD  EPINEPHrine (EPIPEN 2-PAK) 0.3 mg/0.3 mL SOAJ injection Inject 0.3 mLs (0.3 mg total) into the muscle once. 03/07/14   Laurey Morale, MD  levocetirizine (XYZAL) 5 MG tablet Take 1 tablet (5 mg total) by mouth every evening. 06/28/22   Laurey Morale, MD  montelukast (SINGULAIR) 10 MG tablet Take 1 tablet (10 mg total) by mouth at bedtime. 06/28/22   Laurey Morale, MD  Multiple Vitamin (MULTIVITAMIN WITH MINERALS) TABS Take 1 tablet by mouth daily.    [provider]  omeprazole (PRILOSEC) 20 MG capsule TAKE 1 CAPSULE BY MOUTH ONCE DAILY 01/30/22 01/30/23  Laurey Morale, MD  polyethylene glycol powder (GLYCOLAX/MIRALAX) powder Take 17 g by mouth daily as needed. 03/10/18   Waldon Merl, PA-C  Polyvinyl Alcohol-Povidone 5-6 MG/ML SOLN Apply 1 drop to eye once as needed. Reported on 02/01/2016    [provider]  potassium chloride (KLOR-CON 10) 10 MEQ tablet Take 1 tablet (10 mEq total) by mouth daily. 07/03/22   Laurey Morale, MD  buPROPion (WELLBUTRIN SR) 200 MG 12 hr tablet Take 1 tablet (200 mg total) by mouth daily. 10/31/20 12/08/20  Abby Potash, PA-C      Allergies    Bee venom  and Shellfish allergy    Review of Systems   Review of Systems  Physical Exam Updated Vital Signs BP (!) 178/102 (BP Location: Right Arm)   Pulse 88   Temp 98.1 F (36.7 C) (Oral)   Resp 20   Ht _0  (1.727 m)   Wt 135.2 kg   SpO2 99%   BMI 45.31 kg/m  Physical Exam Vitals and nursing note reviewed.  Constitutional:      Appearance: Normal appearance.  HENT:     Head: Normocephalic and atraumatic.  Eyes:     General: No scleral icterus.    Conjunctiva/sclera: Conjunctivae normal.  Pulmonary:     Effort: Pulmonary effort is normal. No respiratory distress.  Musculoskeletal:     Comments: Full range of motion of the left ankle and MTPs.  Strong DP pulse.  Sensation intact.  Point tenderness along the lateral aspect of  the plantar surface of the foot  Skin:    Findings: No rash.  Neurological:     Mental Status: He is alert.  Psychiatric:        Mood and Affect: Mood normal.     ED Results / Procedures / Treatments   Labs (all labs ordered are listed, but only abnormal results are displayed) Labs Reviewed - No data to display  EKG None  Radiology No results found.  Procedures Procedures   Medications Ordered in ED Medications  naproxen (NAPROSYN) tablet 500 mg (has no administration in time range)    ED Course/ Medical Decision Making/ A&P                           Medical Decision Making Amount and/or Complexity of Data Reviewed Radiology: ordered.  Risk OTC drugs. Prescription drug management.   51 year old male presenting with left foot pain.  Potentially starting after working out at [employer].  Neurovascularly intact.  Physical exam with TTP of the plantar lateral aspect of the foot.  Has a history of plantar fasciitis and gout.  X-ray ordered, viewed and interpreted by me.  I agree with radiology that  Treatment: Naproxen  MDM/disposition: 51 year old male presenting today with left foot pain.  Physical exam relatively benign and x-ray negative.  Will place patient in a lace up brace for outpatient follow-up with PCP.  We discussed the importance of ice, elevation and compression.  Additionally NSAIDs and Tylenol may help with pain and swelling.  He was noted to be hypertensive but he says he thinks it is because he is in pain.  Says that he is consistently compliant with his antihypertensives.  No intervention necessary at this time.  Discharged in ambulatory in stable condition   Final Clinical Impression(s) / ED Diagnoses Final diagnoses:  Foot pain, left    Rx / DC Orders ED Discharge Orders          Ordered    naproxen (NAPROSYN) 500 MG tablet  2 times daily        10/12/22 1621    acetaminophen (TYLENOL) 500 MG tablet  Every 6 hours PRN        10/12/22 1621            Results and diagnoses were explained to the patient. Return precautions discussed in full. Patient had no additional questions and expressed complete understanding.   This chart was dictated using voice recognition software.  Despite best efforts to proofread,  errors can occur which can change the documentation meaning.  Rhae Hammock, PA-C 10/12/22 [place], [place], DO 10/12/22 1655

## 2023-02-11 ENCOUNTER — Other Ambulatory Visit: Payer: Self-pay | Admitting: Family Medicine

## 2023-02-12 MED ORDER — ADDERALL XR 20 MG PO CP24: 20.0000 mg | ORAL_CAPSULE | Freq: Every morning | ORAL | 0 refills | Status: DC

## 2023-02-12 NOTE — Telephone Encounter (Signed)
Last refill-08/09/22--90 tabs, 0 refills Last OV CPE-06/28/22 *Patient request brand name only.   No future OV scheduled

## 2023-02-12 NOTE — Telephone Encounter (Signed)
Done

## 2023-02-14 ENCOUNTER — Telehealth: Payer: Self-pay | Admitting: Family Medicine

## 2023-02-14 NOTE — Telephone Encounter (Signed)
[facility] and stated pt need a PA for brand     ADDERALL XR 20 MG 24 hr capsule .

## 2023-02-17 NOTE — Telephone Encounter (Signed)
Noted PA will be sent to pt plan

## 2023-02-18 ENCOUNTER — Other Ambulatory Visit: Payer: Self-pay

## 2023-02-20 ENCOUNTER — Other Ambulatory Visit: Payer: Self-pay

## 2023-02-24 NOTE — Telephone Encounter (Signed)
Pt plan wanted to know if pt is open to trying different medication that is covered, pt response was that he is will try something different as long as it gives him long lasting effects as the Adderall does. Your PA request has been sent to [place].

## 2023-02-26 MED ORDER — LISDEXAMFETAMINE DIMESYLATE 30 MG PO CAPS: 30.0000 mg | ORAL_CAPSULE | Freq: Every day | ORAL | 0 refills | Status: DC

## 2023-02-26 NOTE — Telephone Encounter (Signed)
Spoke with pt advised that Dr Sarajane Jews sent a New Rx for [facility], verbalized understanding

## 2023-02-26 NOTE — Telephone Encounter (Signed)
I sent in a month of Vyvanse for him to try. This is a derivative of Adderall

## 2023-03-27 ENCOUNTER — Other Ambulatory Visit: Payer: Self-pay | Admitting: Family Medicine

## 2023-03-28 MED ORDER — LISDEXAMFETAMINE DIMESYLATE 30 MG PO CAPS: 30.0000 mg | ORAL_CAPSULE | Freq: Every day | ORAL | 0 refills | Status: DC

## 2023-03-28 NOTE — Telephone Encounter (Signed)
I sent in refills. This needs to be a 30 day because it is a controlled substance

## 2023-03-31 NOTE — Telephone Encounter (Signed)
Spoke to patient and inform him of message from Dr. Clent Ridges. Verbalized understanding.

## 2023-06-27 ENCOUNTER — Other Ambulatory Visit: Payer: Self-pay | Admitting: Family Medicine

## 2023-06-27 DIAGNOSIS — E7849 Other hyperlipidemia: Secondary | ICD-10-CM

## 2023-06-27 DIAGNOSIS — M1A9XX0 Chronic gout, unspecified, without tophus (tophi): Secondary | ICD-10-CM

## 2023-07-19 ENCOUNTER — Other Ambulatory Visit: Payer: Self-pay | Admitting: Family Medicine

## 2023-07-19 DIAGNOSIS — I10 Essential (primary) hypertension: Secondary | ICD-10-CM

## 2023-07-28 ENCOUNTER — Other Ambulatory Visit: Payer: Self-pay | Admitting: Family Medicine

## 2023-08-01 NOTE — Telephone Encounter (Signed)
Pt LOV was on 06/28/2022 Last refill was done on 05/28/23 Pt aware that he needs OV for further refills Please advise

## 2023-08-04 MED ORDER — LISDEXAMFETAMINE DIMESYLATE 30 MG PO CAPS: 30.0000 mg | ORAL_CAPSULE | Freq: Every day | ORAL | 0 refills | Status: DC

## 2023-08-23 ENCOUNTER — Other Ambulatory Visit: Payer: Self-pay | Admitting: Family Medicine

## 2023-08-23 DIAGNOSIS — I10 Essential (primary) hypertension: Secondary | ICD-10-CM

## 2023-09-04 ENCOUNTER — Other Ambulatory Visit: Payer: Self-pay | Admitting: Family Medicine

## 2023-09-04 DIAGNOSIS — I10 Essential (primary) hypertension: Secondary | ICD-10-CM

## 2023-12-01 ENCOUNTER — Other Ambulatory Visit: Payer: Self-pay | Admitting: Family Medicine

## 2023-12-01 DIAGNOSIS — I10 Essential (primary) hypertension: Secondary | ICD-10-CM

## 2023-12-08 ENCOUNTER — Other Ambulatory Visit: Payer: Self-pay | Admitting: Family Medicine

## 2023-12-10 MED ORDER — LISDEXAMFETAMINE DIMESYLATE 30 MG PO CAPS: 30.0000 mg | ORAL_CAPSULE | Freq: Every day | ORAL | 0 refills | Status: DC

## 2023-12-10 NOTE — Telephone Encounter (Signed)
 Done

## 2023-12-11 ENCOUNTER — Other Ambulatory Visit: Payer: Self-pay | Admitting: Family Medicine

## 2023-12-15 ENCOUNTER — Telehealth: Payer: Self-pay

## 2023-12-15 ENCOUNTER — Other Ambulatory Visit (HOSPITAL_COMMUNITY): Payer: Self-pay

## 2023-12-15 NOTE — Telephone Encounter (Signed)
Pharmacy Patient Advocate Encounter   Received notification from Fax that prior authorization for Lisdexamfetamine Dimesylate 30MG  capsules  is required/requested.   Insurance verification completed.   The patient is insured through [facility] .   Per test claim: PA required; However, NEW/RECENT labs/notes are needed to complete & submit PA request. Please see below.  Patient last seen  06/28/2022- unable to complete request

## 2023-12-18 NOTE — Telephone Encounter (Signed)
Send a message to pt advised to call the office and schedule appointment for his Vyvanse refill

## 2023-12-22 NOTE — Telephone Encounter (Signed)
Pt mailbox is full and not able to leave a message. Send MyChart message to pt to call the office and schedule an office visit for his VyVanse refill per pt insurance

## 2023-12-25 NOTE — Telephone Encounter (Signed)
Pt has appointment for 12/30/23

## 2023-12-30 ENCOUNTER — Encounter: Payer: Self-pay | Admitting: Family Medicine

## 2023-12-30 ENCOUNTER — Ambulatory Visit (INDEPENDENT_AMBULATORY_CARE_PROVIDER_SITE_OTHER): Payer: Self-pay | Admitting: Family Medicine

## 2023-12-30 VITALS — BP 128/82 | HR 70 | Temp 98.2°F | Ht 67.5 in | Wt 315.0 lb

## 2023-12-30 DIAGNOSIS — Z0000 Encounter for general adult medical examination without abnormal findings: Secondary | ICD-10-CM

## 2023-12-30 LAB — CBC WITH DIFFERENTIAL/PLATELET
Basophils Absolute: 0.1 10*3/uL (ref 0.0–0.1)
Basophils Relative: 0.9 % (ref 0.0–3.0)
Eosinophils Absolute: 0.2 10*3/uL (ref 0.0–0.7)
Eosinophils Relative: 2.6 % (ref 0.0–5.0)
HCT: 45.8 % (ref 39.0–52.0)
Hemoglobin: 15.1 g/dL (ref 13.0–17.0)
Lymphocytes Relative: 33 % (ref 12.0–46.0)
Lymphs Abs: 2.6 10*3/uL (ref 0.7–4.0)
MCHC: 33.1 g/dL (ref 30.0–36.0)
MCV: 84.4 fL (ref 78.0–100.0)
Monocytes Absolute: 0.8 10*3/uL (ref 0.1–1.0)
Monocytes Relative: 9.5 % (ref 3.0–12.0)
Neutro Abs: 4.3 10*3/uL (ref 1.4–7.7)
Neutrophils Relative %: 54 % (ref 43.0–77.0)
Platelets: 364 10*3/uL (ref 150.0–400.0)
RBC: 5.43 Mil/uL (ref 4.22–5.81)
RDW: 14.3 % (ref 11.5–15.5)
WBC: 7.9 10*3/uL (ref 4.0–10.5)

## 2023-12-30 LAB — LIPID PANEL
Cholesterol: 196 mg/dL (ref 0–200)
HDL: 39.1 mg/dL (ref >39.00)
LDL Cholesterol: 139 mg/dL — ABNORMAL HIGH (ref 0–99)
NonHDL: 156.97
Total CHOL/HDL Ratio: 5
Triglycerides: 90 mg/dL (ref 0.0–149.0)
VLDL: 18 mg/dL (ref 0.0–40.0)

## 2023-12-30 LAB — HEPATIC FUNCTION PANEL
ALT: 23 U/L (ref 0–53)
AST: 14 U/L (ref 0–37)
Albumin: 4.2 g/dL (ref 3.5–5.2)
Alkaline Phosphatase: 73 U/L (ref 39–117)
Bilirubin, Direct: 0.1 mg/dL (ref 0.0–0.3)
Total Bilirubin: 0.4 mg/dL (ref 0.2–1.2)
Total Protein: 7.6 g/dL (ref 6.0–8.3)

## 2023-12-30 LAB — BASIC METABOLIC PANEL
BUN: 16 mg/dL (ref 6–23)
CO2: 27 meq/L (ref 19–32)
Calcium: 9.2 mg/dL (ref 8.4–10.5)
Chloride: 104 meq/L (ref 96–112)
Creatinine, Ser: 1.17 mg/dL (ref 0.40–1.50)
GFR: 72.66 mL/min (ref >60.00)
Glucose, Bld: 79 mg/dL (ref 70–99)
Potassium: 4.2 meq/L (ref 3.5–5.1)
Sodium: 139 meq/L (ref 135–145)

## 2023-12-30 LAB — HEMOGLOBIN A1C: Hgb A1c MFr Bld: 5.7 % (ref 4.6–6.5)

## 2023-12-30 LAB — PSA: PSA: 0.19 ng/mL (ref 0.10–4.00)

## 2023-12-30 LAB — TSH: TSH: 0.99 u[IU]/mL (ref 0.35–5.50)

## 2023-12-30 MED ORDER — LISDEXAMFETAMINE DIMESYLATE 30 MG PO CAPS: 30.0000 mg | ORAL_CAPSULE | Freq: Every day | ORAL | 0 refills | Status: DC

## 2023-12-30 NOTE — Progress Notes (Signed)
 Subjective:    Patient ID: Daniel [name], male    DOB: 1970/12/02, 53 y.o.   MRN: [id number]  HPI Here for a well exam. He has 2 things to discuss. First he found a lump on his abdomen 6 months ago, and it has not changed since then. It does not bother him. Also he was riding his exercise bike one week ago when his right foot slipped off the pedal. This jerked his leg, and he had immediate pain the right leg just above the knee. No swelling. He has been taking Aleve  and applying ice.    Review of Systems  Constitutional: Negative.   HENT: Negative.    Eyes: Negative.   Respiratory: Negative.    Cardiovascular: Negative.   Gastrointestinal: Negative.   Genitourinary: Negative.   Musculoskeletal:  Positive for arthralgias.  Skin: Negative.   Neurological: Negative.   Psychiatric/Behavioral: Negative.         Objective:   Physical Exam Constitutional:      General: He is not in acute distress.    Appearance: He is well-developed. He is obese. He is not diaphoretic.  HENT:     Head: Normocephalic and atraumatic.     Right Ear: External ear normal.     Left Ear: External ear normal.     Nose: Nose normal.     Mouth/Throat:     Pharynx: No oropharyngeal exudate.  Eyes:     General: No scleral icterus.       Right eye: No discharge.        Left eye: No discharge.     Conjunctiva/sclera: Conjunctivae normal.     Pupils: Pupils are equal, round, and reactive to light.  Neck:     Thyroid : No thyromegaly.     Vascular: No JVD.     Trachea: No tracheal deviation.  Cardiovascular:     Rate and Rhythm: Normal rate and regular rhythm.     Pulses: Normal pulses.     Heart sounds: Normal heart sounds. No murmur heard.    No friction rub. No gallop.  Pulmonary:     Effort: Pulmonary effort is normal. No respiratory distress.     Breath sounds: Normal breath sounds. No wheezing or rales.  Chest:     Chest wall: No tenderness.  Abdominal:     General: Bowel sounds are normal.  There is no distension.     Palpations: Abdomen is soft.     Tenderness: There is no abdominal tenderness. There is no guarding or rebound.     Comments: There is a 2 cm firm mobile non-tender mass in the RUQ just below the ribs   Genitourinary:    Penis: Normal. No tenderness.      Testes: Normal.     Prostate: Normal.     Rectum: Normal. Guaiac result negative.  Musculoskeletal:        General: No tenderness. Normal range of motion.     Cervical back: Neck supple.  Lymphadenopathy:     Cervical: No cervical adenopathy.  Skin:    General: Skin is warm and dry.     Coloration: Skin is not pale.     Findings: No erythema or rash.  Neurological:     General: No focal deficit present.     Mental Status: He is alert and oriented to person, place, and time.     Cranial Nerves: No cranial nerve deficit.     Motor: No abnormal muscle tone.  Coordination: Coordination normal.     Deep Tendon Reflexes: Reflexes are normal and symmetric. Reflexes normal.  Psychiatric:        Mood and Affect: Mood normal.        Behavior: Behavior normal.        Thought Content: Thought content normal.        Judgment: Judgment normal.           Assessment & Plan:  Well exam. We discussed diet and exercise. Get fasting labs. The lump on the abdomen is a lipoma. We will monitor this for now. He had a recent right quadriceps strain. He will rest the leg and apply ice. Recheck if not better in 2 weeks. We will refer him for his first colonoscopy.  Garnette Olmsted, MD

## 2024-01-07 ENCOUNTER — Encounter: Payer: Self-pay | Admitting: Family Medicine

## 2024-01-09 ENCOUNTER — Telehealth (HOSPITAL_COMMUNITY): Payer: Self-pay

## 2024-01-09 NOTE — Telephone Encounter (Signed)
Pharmacy Patient Advocate Encounter   Received notification from CoverMyMeds that prior authorization for  Lisdexamfetamine Dimesylate 30MG  capsules is required/requested.   Insurance verification completed.   The patient is insured through [facility] .   Per test claim: PA required; PA started via CoverMyMeds. KEY [id number] . Waiting for clinical questions to populate.

## 2024-01-12 ENCOUNTER — Telehealth: Payer: Self-pay

## 2024-01-12 NOTE — Telephone Encounter (Signed)
Pharmacy Patient Advocate Encounter  Received notification from [facility] that Prior Authorization for Lisdexamfetamine Dimesylate 30MG  capsules has been CANCELLED due to: Your PA request cannot be processed. The corresponding PA request is closed. If a prior authorization is required please submit a new request.   PA request has been Submitted. New Encounter created for follow up. For additional info see Pharmacy Prior Auth telephone encounter from 01/12/24.

## 2024-01-12 NOTE — Telephone Encounter (Signed)
Pharmacy Patient Advocate Encounter   Received notification from Fax that prior authorization for Lisdexamfetamine Dimesylate 30MG  capsules is required/requested.   Insurance verification completed.   The patient is insured through [facility] .   Per test claim: PA required; PA submitted to above mentioned insurance via CoverMyMeds Key/confirmation #/EOC [id number] Status is pending

## 2024-01-13 ENCOUNTER — Other Ambulatory Visit (HOSPITAL_COMMUNITY): Payer: Self-pay

## 2024-01-13 NOTE — Telephone Encounter (Signed)
Pharmacy Patient Advocate Encounter  Received notification from [facility] that Prior Authorization for Lisdexamfetamine Dimesylate 30MG  capsules has been APPROVED from 01/12/2024 to 01/11/2027. Unable to obtain price due to refill too soon rejection, last fill date 01/12/2024 next available fill date 03/24/2024   PA #/Case ID/Reference #: [id number]

## 2024-01-19 ENCOUNTER — Encounter: Payer: Self-pay | Admitting: Gastroenterology

## 2024-01-19 NOTE — Telephone Encounter (Signed)
 This Rx PA was approved

## 2024-02-02 ENCOUNTER — Other Ambulatory Visit (HOSPITAL_COMMUNITY): Payer: Self-pay

## 2024-02-05 ENCOUNTER — Ambulatory Visit (AMBULATORY_SURGERY_CENTER): Payer: 59 | Admitting: *Deleted

## 2024-02-05 VITALS — Ht 69.0 in | Wt 300.0 lb

## 2024-02-05 DIAGNOSIS — Z1211 Encounter for screening for malignant neoplasm of colon: Secondary | ICD-10-CM

## 2024-02-05 MED ORDER — SUFLAVE 178.7 G PO SOLR: 1.0000 | Freq: Once | ORAL | 0 refills | Status: AC

## 2024-02-05 NOTE — Progress Notes (Signed)
 Pt's name and DOB verified at the beginning of the pre-visit wit 2 identifiers   Pt denies any difficulty with ambulating,sitting, laying down or rolling side to side  Pt has no issues with ambulation   Pt has no issues moving head neck or swallowing  No egg or soy allergy known to patient   No issues known to pt with past sedation with any surgeries or procedures  Patient denies ever being intubated  No FH of Malignant Hyperthermia  Pt is not on diet pills or shots  Pt is not on home 02   Pt is not on blood thinners   Pt denies issues with constipation   Pt is not on dialysis  Pt denise any abnormal heart rhythms   Pt denies any upcoming cardiac testing  Patient's chart reviewed by Cathlyn Parsons CNRA prior to pre-visit and patient appropriate for the [facility].  Pre-visit completed and red dot placed by patient's name on their procedure day (on provider's schedule).    Visit by phone  Pt states weight is 300 lb  IInstructions reviewed. Pt given [facility], [facility] main # and MD on call # prior to instructions.  Pt states understanding of instructions. Instructed to review again prior to procedure. Pt states they will.   Informed pt that they will receive a text or  call from [facility] regarding there prep med.

## 2024-02-11 ENCOUNTER — Encounter: Payer: Self-pay | Admitting: Family Medicine

## 2024-02-29 ENCOUNTER — Other Ambulatory Visit: Payer: Self-pay | Admitting: Family Medicine

## 2024-02-29 DIAGNOSIS — I10 Essential (primary) hypertension: Secondary | ICD-10-CM

## 2024-03-01 ENCOUNTER — Encounter: Payer: Self-pay | Admitting: Gastroenterology

## 2024-03-02 ENCOUNTER — Ambulatory Visit (AMBULATORY_SURGERY_CENTER): Payer: Self-pay | Admitting: Gastroenterology

## 2024-03-02 ENCOUNTER — Encounter: Payer: Self-pay | Admitting: Gastroenterology

## 2024-03-02 VITALS — BP 138/73 | HR 69 | Temp 97.5°F | Resp 22 | Ht 69.0 in | Wt 300.0 lb

## 2024-03-02 DIAGNOSIS — K648 Other hemorrhoids: Secondary | ICD-10-CM

## 2024-03-02 DIAGNOSIS — K621 Rectal polyp: Secondary | ICD-10-CM

## 2024-03-02 DIAGNOSIS — Z1211 Encounter for screening for malignant neoplasm of colon: Secondary | ICD-10-CM

## 2024-03-02 DIAGNOSIS — K644 Residual hemorrhoidal skin tags: Secondary | ICD-10-CM | POA: Diagnosis not present

## 2024-03-02 DIAGNOSIS — D123 Benign neoplasm of transverse colon: Secondary | ICD-10-CM

## 2024-03-02 DIAGNOSIS — K635 Polyp of colon: Secondary | ICD-10-CM | POA: Diagnosis not present

## 2024-03-02 DIAGNOSIS — K5730 Diverticulosis of large intestine without perforation or abscess without bleeding: Secondary | ICD-10-CM | POA: Diagnosis not present

## 2024-03-02 DIAGNOSIS — D128 Benign neoplasm of rectum: Secondary | ICD-10-CM

## 2024-03-02 MED ORDER — SODIUM CHLORIDE 0.9 % IV SOLN: 500.0000 mL | Freq: Once | INTRAVENOUS | Status: DC

## 2024-03-02 NOTE — Progress Notes (Signed)
 Sedate, gd SR, tolerated procedure well, VSS, report to RN

## 2024-03-02 NOTE — Progress Notes (Signed)
 [facility] History and Physical   Primary Care Physician:  Nelwyn Salisbury, MD   Reason for Procedure:  Colorectal cancer screening  Plan:    Screening colonoscopy with possible interventions as needed     HPI: Daniel [name] is a very pleasant 53 y.o. male here for screening colonoscopy. Denies any nausea, vomiting, abdominal pain, melena or bright red blood per rectum  The risks and benefits as well as alternatives of endoscopic procedure(s) have been discussed and reviewed. All questions answered. The patient agrees to proceed.    Past Medical History:  Diagnosis Date   Allergy    Asthma    Constipation    GERD (gastroesophageal reflux disease)    Hypertension     Past Surgical History:  Procedure Laterality Date   UPPER GASTROINTESTINAL ENDOSCOPY      Prior to Admission medications   Medication Sig Start Date End Date Taking? Authorizing Provider  acetaminophen (TYLENOL) 500 MG tablet Take 1 tablet (500 mg total) by mouth every 6 (six) hours as needed. 10/12/22   Redwine, Madison A, PA-C  albuterol (VENTOLIN HFA) 108 (90 Base) MCG/ACT inhaler INHALE 2 PUFFS INTO THE LUNGS EVERY 4 HOURS AS NEEDED FOR WHEEZING OR SHORTNESS OF BREATH. Patient not taking: Reported on 03/02/2024 01/30/22   Nelwyn Salisbury, MD  allopurinol (ZYLOPRIM) 100 MG tablet TAKE 1 TABLET BY MOUTH EVERY DAY Patient not taking: Reported on 03/02/2024 06/30/23   Nelwyn Salisbury, MD  atorvastatin (LIPITOR) 10 MG tablet TAKE 1 TABLET BY MOUTH EVERY DAY Patient not taking: Reported on 03/02/2024 06/30/23   Nelwyn Salisbury, MD  chlorthalidone (HYGROTON) 25 MG tablet TAKE 1 TABLET (25 MG TOTAL) BY MOUTH DAILY. Patient not taking: Reported on 03/02/2024 12/01/23   Nelwyn Salisbury, MD  Colchicine 0.6 MG CAPS Take 1 tablet by mouth 2 (two) times daily as needed. Patient not taking: Reported on 03/02/2024 05/09/20   Hilts, Michael, MD  COVID-19 mRNA vaccine, Moderna, 100 MCG/0.5ML injection Inject into the  muscle. Patient not taking: Reported on 03/02/2024 05/16/21   Judyann Munson, MD  diltiazem (TIADYLT ER) 240 MG 24 hr capsule TAKE 1 CAPSULE BY MOUTH EVERY DAY 03/01/24   Nelwyn Salisbury, MD  EPINEPHrine (EPIPEN 2-PAK) 0.3 mg/0.3 mL SOAJ injection Inject 0.3 mLs (0.3 mg total) into the muscle once. Patient not taking: Reported on 03/02/2024 03/07/14   Nelwyn Salisbury, MD  levocetirizine (XYZAL) 5 MG tablet TAKE 1 TABLET BY MOUTH EVERY DAY IN THE EVENING 12/11/23   Nelwyn Salisbury, MD  lisdexamfetamine (VYVANSE) 30 MG capsule Take 1 capsule (30 mg total) by mouth daily. 08/04/23 11/02/23  Nelwyn Salisbury, MD  lisdexamfetamine (VYVANSE) 30 MG capsule Take 1 capsule (30 mg total) by mouth daily. 12/30/23   Nelwyn Salisbury, MD  montelukast (SINGULAIR) 10 MG tablet TAKE 1 TABLET BY MOUTH EVERYDAY AT BEDTIME 09/04/23   Nelwyn Salisbury, MD  Multiple Vitamin (MULTIVITAMIN WITH MINERALS) TABS Take 1 tablet by mouth daily.    [provider]  naproxen (NAPROSYN) 500 MG tablet Take 1 tablet (500 mg total) by mouth 2 (two) times daily. Patient not taking: Reported on 02/05/2024 10/12/22   Redwine, Madison A, PA-C  omeprazole (PRILOSEC) 20 MG capsule TAKE 1 CAPSULE BY MOUTH ONCE DAILY 01/30/22 01/30/23  Nelwyn Salisbury, MD  polyethylene glycol powder (GLYCOLAX/MIRALAX) powder Take 17 g by mouth daily as needed. Patient not taking: Reported on 03/02/2024 03/10/18   Demetrio Lapping, PA-C  Polyvinyl Alcohol-Povidone 5-6 MG/ML SOLN Apply 1 drop to eye once as needed. Reported on 02/01/2016 Patient not taking: Reported on 02/05/2024    [provider]  potassium chloride (KLOR-CON) 10 MEQ tablet TAKE 1 TABLET BY MOUTH EVERY DAY 08/25/23   Nelwyn Salisbury, MD  buPROPion [facility] SR) 200 MG 12 hr tablet Take 1 tablet (200 mg total) by mouth daily. 10/31/20 12/08/20  Alois Cliche, PA-C    Current Outpatient Medications  Medication Sig Dispense Refill   acetaminophen (TYLENOL) 500 MG tablet Take 1 tablet (500 mg total) by  mouth every 6 (six) hours as needed. 30 tablet 0   albuterol (VENTOLIN HFA) 108 (90 Base) MCG/ACT inhaler INHALE 2 PUFFS INTO THE LUNGS EVERY 4 HOURS AS NEEDED FOR WHEEZING OR SHORTNESS OF BREATH. (Patient not taking: Reported on 03/02/2024) 18 g 0   allopurinol (ZYLOPRIM) 100 MG tablet TAKE 1 TABLET BY MOUTH EVERY DAY (Patient not taking: Reported on 03/02/2024) 90 tablet 0   atorvastatin (LIPITOR) 10 MG tablet TAKE 1 TABLET BY MOUTH EVERY DAY (Patient not taking: Reported on 03/02/2024) 90 tablet 0   chlorthalidone (HYGROTON) 25 MG tablet TAKE 1 TABLET (25 MG TOTAL) BY MOUTH DAILY. (Patient not taking: Reported on 03/02/2024) 90 tablet 0   Colchicine 0.6 MG CAPS Take 1 tablet by mouth 2 (two) times daily as needed. (Patient not taking: Reported on 03/02/2024) 60 capsule 3   COVID-19 mRNA vaccine, Moderna, 100 MCG/0.5ML injection Inject into the muscle. (Patient not taking: Reported on 03/02/2024) 0.25 mL 0   diltiazem (TIADYLT ER) 240 MG 24 hr capsule TAKE 1 CAPSULE BY MOUTH EVERY DAY 90 capsule 3   EPINEPHrine (EPIPEN 2-PAK) 0.3 mg/0.3 mL SOAJ injection Inject 0.3 mLs (0.3 mg total) into the muscle once. (Patient not taking: Reported on 03/02/2024) 1 Device 11   levocetirizine (XYZAL) 5 MG tablet TAKE 1 TABLET BY MOUTH EVERY DAY IN THE EVENING 90 tablet 3   lisdexamfetamine (VYVANSE) 30 MG capsule Take 1 capsule (30 mg total) by mouth daily. 90 capsule 0   lisdexamfetamine (VYVANSE) 30 MG capsule Take 1 capsule (30 mg total) by mouth daily. 30 capsule 0   montelukast (SINGULAIR) 10 MG tablet TAKE 1 TABLET BY MOUTH EVERYDAY AT BEDTIME 90 tablet 3   Multiple Vitamin (MULTIVITAMIN WITH MINERALS) TABS Take 1 tablet by mouth daily.     naproxen (NAPROSYN) 500 MG tablet Take 1 tablet (500 mg total) by mouth 2 (two) times daily. (Patient not taking: Reported on 02/05/2024) 30 tablet 0   omeprazole (PRILOSEC) 20 MG capsule TAKE 1 CAPSULE BY MOUTH ONCE DAILY 90 capsule 3   polyethylene glycol powder (GLYCOLAX/MIRALAX)  powder Take 17 g by mouth daily as needed. (Patient not taking: Reported on 03/02/2024) 3350 g 1   Polyvinyl Alcohol-Povidone 5-6 MG/ML SOLN Apply 1 drop to eye once as needed. Reported on 02/01/2016 (Patient not taking: Reported on 02/05/2024)     potassium chloride (KLOR-CON) 10 MEQ tablet TAKE 1 TABLET BY MOUTH EVERY DAY 90 tablet 1   Current Facility-Administered Medications  Medication Dose Route Frequency Provider Last Rate Last Admin   0.9 %  sodium chloride infusion  500 mL Intravenous Once Napoleon Form, MD        Allergies as of 03/02/2024 - Review Complete 03/02/2024  Allergen Reaction Noted   Bee venom Anaphylaxis 07/18/2012   Shellfish allergy Anaphylaxis 07/18/2012    Family History  Problem Relation Age of Onset   Hypertension Mother  Hyperlipidemia Mother    Obesity Mother    Sudden death Father    Hyperlipidemia Other    Hypertension Other    Stroke Other    Colon cancer Neg Hx    Colon polyps Neg Hx    Esophageal cancer Neg Hx    Stomach cancer Neg Hx    Rectal cancer Neg Hx     Social History   Socioeconomic History   Marital status: Married    Spouse name: Not on file   Number of children: Not on file   Years of education: Not on file   Highest education level: Not on file  Occupational History   Occupation: Production assistant, radio: [employer]  Tobacco Use   Smoking status: Never   Smokeless tobacco: Never  Substance and Sexual Activity   Alcohol use: No    Alcohol/week: 0.0 standard drinks of alcohol   Drug use: No   Sexual activity: Yes    Birth control/protection: None  Other Topics Concern   Not on file  Social History Narrative   Married   Social Drivers of Corporate investment banker Strain: Not on file  Food Insecurity: Not on file  Transportation Needs: Not on file  Physical Activity: Not on file  Stress: Not on file  Social Connections: Not on file  Intimate Partner Violence: Not on file    Review of  Systems:  All other review of systems negative except as mentioned in the HPI.  Physical Exam: Vital signs in last 24 hours: BP 139/82   Pulse 72   Temp (!) 97.5 F (36.4 C)   Ht 5\' 9"  (1.753 m)   Wt 300 lb (136.1 kg)   SpO2 96%   BMI 44.30 kg/m  General:   Alert, NAD Lungs:  Clear .   Heart:  Regular rate and rhythm Abdomen:  Soft, nontender and nondistended. Neuro/Psych:  Alert and cooperative. Normal mood and affect. A and O x 3  Reviewed labs, radiology imaging, old records and pertinent past GI work up  Patient is appropriate for planned procedure(s) and anesthesia in an ambulatory setting   K. Scherry Ran , MD [phone]

## 2024-03-02 NOTE — Progress Notes (Signed)
 Called to room to assist during endoscopic procedure.  Patient ID and intended procedure confirmed with present staff. Received instructions for my participation in the procedure from the performing physician.

## 2024-03-02 NOTE — Op Note (Signed)
 [facility] Patient Name: Daniel [name] Procedure Date: 03/02/2024 8:33 AM MRN: [id number] Endoscopist: Napoleon Form , MD, [id number] Age: 53 Referring MD:  Date of Birth: 05-15-1971 Gender: Male Account #: [id number] Procedure:                Colonoscopy Indications:              Screening for colorectal malignant neoplasm Medicines:                Monitored Anesthesia Care Procedure:                Pre-Anesthesia Assessment:                           - Prior to the procedure, a History and Physical                            was performed, and patient medications and                            allergies were reviewed. The patient's tolerance of                            previous anesthesia was also reviewed. The risks                            and benefits of the procedure and the sedation                            options and risks were discussed with the patient.                            All questions were answered, and informed consent                            was obtained. Prior Anticoagulants: The patient has                            taken no anticoagulant or antiplatelet agents. ASA                            Grade Assessment: III - A patient with severe                            systemic disease. After reviewing the risks and                            benefits, the patient was deemed in satisfactory                            condition to undergo the procedure.                           After obtaining informed consent, the colonoscope  was passed under direct vision. Throughout the                            procedure, the patient's blood pressure, pulse, and                            oxygen saturations were monitored continuously. The                            Olympus Scope SN: [id number] was introduced through                            the anus and advanced to the the cecum, identified                            by  appendiceal orifice and ileocecal valve. The                            colonoscopy was performed without difficulty. The                            patient tolerated the procedure well. The quality                            of the bowel preparation was adequate. The                            ileocecal valve, appendiceal orifice, and rectum                            were photographed. Scope In: 8:38:27 AM Scope Out: 8:53:13 AM Scope Withdrawal Time: 0 hours 12 minutes 31 seconds  Total Procedure Duration: 0 hours 14 minutes 46 seconds  Findings:                 The perianal and digital rectal examinations were                            normal.                           Five sessile polyps were found in the rectum X 4                            and transverse colon X 1. The polyps were 3 to 6 mm                            in size. These polyps were removed with a cold                            snare. Resection and retrieval were complete.                           Scattered large-mouthed, medium-mouthed and  small-mouthed diverticula were found in the sigmoid                            colon, descending colon, transverse colon and                            ascending colon.                           Non-bleeding external and internal hemorrhoids were                            found during retroflexion. The hemorrhoids were                            medium-sized. Complications:            No immediate complications. Estimated Blood Loss:     Estimated blood loss was minimal. Impression:               - Five 3 to 6 mm polyps in the rectum and in the                            transverse colon, removed with a cold snare.                            Resected and retrieved.                           - Moderate diverticulosis in the sigmoid colon, in                            the descending colon, in the transverse colon and                            in the  ascending colon.                           - Non-bleeding external and internal hemorrhoids. Recommendation:           - Patient has a contact number available for                            emergencies. The signs and symptoms of potential                            delayed complications were discussed with the                            patient. Return to normal activities tomorrow.                            Written discharge instructions were provided to the                            patient.                           -  Resume previous diet.                           - Continue present medications.                           - Await pathology results.                           - Repeat colonoscopy in 3 - 10 years for                            surveillance based on pathology results. Napoleon Form, MD 03/02/2024 8:59:22 AM This report has been signed electronically.

## 2024-03-02 NOTE — Patient Instructions (Signed)

## 2024-03-02 NOTE — Progress Notes (Signed)
 Pt's states no medical or surgical changes since previsit or office visit.

## 2024-03-03 ENCOUNTER — Telehealth: Payer: Self-pay

## 2024-03-03 NOTE — Telephone Encounter (Signed)
  Follow up Call-     03/02/2024    7:55 AM  Call back number  Post procedure Call Back phone  # [phone]  Permission to leave phone message Yes     Patient questions:  Do you have a fever, pain , or abdominal swelling? No. Pain Score  0 *  Have you tolerated food without any problems? Yes.    Have you been able to return to your normal activities? Yes.    Do you have any questions about your discharge instructions: Diet   No. Medications  No. Follow up visit  No.  Do you have questions or concerns about your Care? No.  Actions: * If pain score is 4 or above: No action needed, pain <4.

## 2024-03-05 LAB — SURGICAL PATHOLOGY

## 2024-04-08 ENCOUNTER — Ambulatory Visit: Payer: Self-pay | Admitting: Gastroenterology

## 2024-05-19 ENCOUNTER — Ambulatory Visit
Admission: EM | Admit: 2024-05-19 | Discharge: 2024-05-19 | Disposition: A | Discharge status: Home / Self Care | Attending: Family Medicine | Admitting: Family Medicine

## 2024-05-19 DIAGNOSIS — J4531 Mild persistent asthma with (acute) exacerbation: Secondary | ICD-10-CM

## 2024-05-19 DIAGNOSIS — K529 Noninfective gastroenteritis and colitis, unspecified: Secondary | ICD-10-CM

## 2024-05-19 DIAGNOSIS — R0602 Shortness of breath: Secondary | ICD-10-CM

## 2024-05-19 MED ORDER — IPRATROPIUM-ALBUTEROL 0.5-2.5 (3) MG/3ML IN SOLN
3.0000 mL | Freq: Once | RESPIRATORY_TRACT | Status: AC
  Administered 2024-05-19: 3 mL via RESPIRATORY_TRACT

## 2024-05-19 MED ORDER — LOPERAMIDE HCL 2 MG PO CAPS: 2.0000 mg | ORAL_CAPSULE | Freq: Two times a day (BID) | ORAL | 0 refills | Status: AC | PRN

## 2024-05-19 MED ORDER — PREDNISONE 20 MG PO TABS: ORAL_TABLET | ORAL | 0 refills | Status: DC

## 2024-05-19 MED ORDER — ALBUTEROL SULFATE HFA 108 (90 BASE) MCG/ACT IN AERS: INHALATION_SPRAY | RESPIRATORY_TRACT | 0 refills | Status: AC

## 2024-05-19 NOTE — ED Triage Notes (Signed)
 Pt states diarrhea since yesterday.  Also states he has been wheezing for the past 2  days.  States he had to use his inhaler.

## 2024-05-19 NOTE — ED Provider Notes (Signed)
 [facility]  Note:  This document was prepared using Conservation officer, historic buildings and may include unintentional dictation errors.  MRN: [id number] DOB: October 20, 1971  Subjective:   Daniel [name] is a 53 y.o. male presenting for 1 day history of persistent diarrhea, nausea. No fever, bloody stools, abdominal pain, vomiting, recent antibiotic use, hospitalizations or long distance travel.  Has not eaten raw foods, drank unfiltered water.  No history of GI disorders including Crohn's, IBS, ulcerative colitis. Also reports 2 day history of wheezing. No body aches, cough, sinus symptoms, general respiratory sick symptoms. Has been using his inhaler. Has allergies to shell fish, suspects cross contamination.  Has also been affected by the weather.  Needs a refill on his albuterol  inhaler.  No current facility-administered medications for this encounter.  Current Outpatient Medications:    acetaminophen  (TYLENOL ) 500 MG tablet, Take 1 tablet (500 mg total) by mouth every 6 (six) hours as needed., Disp: 30 tablet, Rfl: 0   albuterol  (VENTOLIN  HFA) 108 (90 Base) MCG/ACT inhaler, INHALE 2 PUFFS INTO THE LUNGS EVERY 4 HOURS AS NEEDED FOR WHEEZING OR SHORTNESS OF BREATH. (Patient not taking: Reported on 03/02/2024), Disp: 18 g, Rfl: 0   allopurinol  (ZYLOPRIM ) 100 MG tablet, TAKE 1 TABLET BY MOUTH EVERY DAY (Patient not taking: Reported on 03/02/2024), Disp: 90 tablet, Rfl: 0   atorvastatin  (LIPITOR) 10 MG tablet, TAKE 1 TABLET BY MOUTH EVERY DAY (Patient not taking: Reported on 03/02/2024), Disp: 90 tablet, Rfl: 0   chlorthalidone  (HYGROTON ) 25 MG tablet, TAKE 1 TABLET (25 MG TOTAL) BY MOUTH DAILY. (Patient not taking: Reported on 03/02/2024), Disp: 90 tablet, Rfl: 0   Colchicine  0.6 MG CAPS, Take 1 tablet by mouth 2 (two) times daily as needed. (Patient not taking: Reported on 03/02/2024), Disp: 60 capsule, Rfl: 3   COVID-19 mRNA vaccine, Moderna, 100 MCG/0.5ML injection, Inject into the  muscle. (Patient not taking: Reported on 03/02/2024), Disp: 0.25 mL, Rfl: 0   diltiazem  (TIADYLT  ER) 240 MG 24 hr capsule, TAKE 1 CAPSULE BY MOUTH EVERY DAY, Disp: 90 capsule, Rfl: 3   EPINEPHrine  (EPIPEN  2-PAK) 0.3 mg/0.3 mL SOAJ injection, Inject 0.3 mLs (0.3 mg total) into the muscle once. (Patient not taking: Reported on 03/02/2024), Disp: 1 Device, Rfl: 11   levocetirizine (XYZAL ) 5 MG tablet, TAKE 1 TABLET BY MOUTH EVERY DAY IN THE EVENING, Disp: 90 tablet, Rfl: 3   lisdexamfetamine (VYVANSE ) 30 MG capsule, Take 1 capsule (30 mg total) by mouth daily., Disp: 90 capsule, Rfl: 0   lisdexamfetamine (VYVANSE ) 30 MG capsule, Take 1 capsule (30 mg total) by mouth daily., Disp: 30 capsule, Rfl: 0   montelukast  (SINGULAIR ) 10 MG tablet, TAKE 1 TABLET BY MOUTH EVERYDAY AT BEDTIME, Disp: 90 tablet, Rfl: 3   Multiple Vitamin (MULTIVITAMIN WITH MINERALS) TABS, Take 1 tablet by mouth daily., Disp: , Rfl:    naproxen  (NAPROSYN ) 500 MG tablet, Take 1 tablet (500 mg total) by mouth 2 (two) times daily. (Patient not taking: Reported on 02/05/2024), Disp: 30 tablet, Rfl: 0   omeprazole  (PRILOSEC) 20 MG capsule, TAKE 1 CAPSULE BY MOUTH ONCE DAILY, Disp: 90 capsule, Rfl: 3   polyethylene glycol powder (GLYCOLAX /MIRALAX ) powder, Take 17 g by mouth daily as needed. (Patient not taking: Reported on 03/02/2024), Disp: 3350 g, Rfl: 1   Polyvinyl Alcohol-Povidone 5-6 MG/ML SOLN, Apply 1 drop to eye once as needed. Reported on 02/01/2016 (Patient not taking: Reported on 02/05/2024), Disp: , Rfl:    potassium chloride  (  KLOR-CON ) 10 MEQ tablet, TAKE 1 TABLET BY MOUTH EVERY DAY, Disp: 90 tablet, Rfl: 1   Allergies  Allergen Reactions   Bee Venom Anaphylaxis   Shellfish Allergy Anaphylaxis    Past Medical History:  Diagnosis Date   Allergy    Asthma    Constipation    GERD (gastroesophageal reflux disease)    Hypertension      Past Surgical History:  Procedure Laterality Date   UPPER GASTROINTESTINAL ENDOSCOPY       Family History  Problem Relation Age of Onset   Hypertension Mother    Hyperlipidemia Mother    Obesity Mother    Sudden death Father    Hyperlipidemia Other    Hypertension Other    Stroke Other    Colon cancer Neg Hx    Colon polyps Neg Hx    Esophageal cancer Neg Hx    Stomach cancer Neg Hx    Rectal cancer Neg Hx     Social History   Tobacco Use   Smoking status: Never   Smokeless tobacco: Never  Substance Use Topics   Alcohol use: No    Alcohol/week: 0.0 standard drinks of alcohol   Drug use: No    ROS   Objective:   Vitals: BP (!) 154/95 (BP Location: Right Arm)   Pulse 84   Temp 98.2 F (36.8 C) (Oral)   Resp 16   SpO2 96%   Physical Exam Constitutional:      General: He is not in acute distress.    Appearance: Normal appearance. He is well-developed and normal weight. He is not ill-appearing, toxic-appearing or diaphoretic.  HENT:     Head: Normocephalic and atraumatic.     Right Ear: External ear normal.     Left Ear: External ear normal.     Nose: Nose normal.     Mouth/Throat:     Mouth: Mucous membranes are moist.   Eyes:     General: No scleral icterus.       Right eye: No discharge.        Left eye: No discharge.     Extraocular Movements: Extraocular movements intact.    Cardiovascular:     Rate and Rhythm: Normal rate and regular rhythm.     Heart sounds: Normal heart sounds. No murmur heard.    No friction rub. No gallop.  Pulmonary:     Effort: Pulmonary effort is normal. No respiratory distress.     Breath sounds: No stridor. Wheezing (mild throughout) present. No rhonchi or rales.  Abdominal:     General: Bowel sounds are increased. There is no distension.     Palpations: Abdomen is soft. There is no mass.     Tenderness: There is no abdominal tenderness. There is no right CVA tenderness, left CVA tenderness, guarding or rebound.   Musculoskeletal:     Cervical back: Normal range of motion.   Neurological:     Mental  Status: He is alert and oriented to person, place, and time.   Psychiatric:        Mood and Affect: Mood normal.        Behavior: Behavior normal.        Thought Content: Thought content normal.        Judgment: Judgment normal.    A 0.5mg -2.5mg  ipratropium-albuterol  nebulized solution was administered in clinic.    Assessment and Plan :   PDMP not reviewed this encounter.  1. Colitis   2. Mild persistent asthma  with (acute) exacerbation   3. Shortness of breath on exertion    Recommend managing for colitis with supportive care.  DuoNeb treatment as above, recommend prednisone  course for his acute exacerbation of his asthma.  Refilled albuterol .  Use supportive care otherwise.  Counseled patient on potential for adverse effects with medications prescribed/recommended today, ER and return-to-clinic precautions discussed, patient verbalized understanding.    Christopher Savannah, [place] 05/19/24 [id number]

## 2024-08-19 ENCOUNTER — Encounter: Payer: Self-pay | Admitting: Family Medicine

## 2024-08-19 MED ORDER — LISDEXAMFETAMINE DIMESYLATE 30 MG PO CAPS: 30.0000 mg | ORAL_CAPSULE | Freq: Every day | ORAL | 0 refills | Status: DC

## 2024-08-19 NOTE — Telephone Encounter (Signed)
 Done. This must be for 30 days at a time

## 2024-08-30 ENCOUNTER — Other Ambulatory Visit: Payer: Self-pay | Admitting: Family Medicine

## 2024-09-14 ENCOUNTER — Other Ambulatory Visit: Payer: Self-pay | Admitting: Family Medicine

## 2024-09-26 ENCOUNTER — Encounter: Payer: Self-pay | Admitting: Family Medicine

## 2024-09-28 NOTE — Telephone Encounter (Signed)
 He already has refills until Dec. 25

## 2024-10-06 ENCOUNTER — Telehealth: Payer: Self-pay

## 2024-10-06 ENCOUNTER — Other Ambulatory Visit (HOSPITAL_COMMUNITY): Payer: Self-pay

## 2024-10-06 NOTE — Telephone Encounter (Signed)
 Noted

## 2024-10-06 NOTE — Telephone Encounter (Signed)
 Pharmacy Patient Advocate Encounter   Received notification from Patient Advice Request messages that prior authorization for Vyvanse  30 caps is required/requested.   Insurance verification completed.   The patient is insured through [facility].   Spoke with [facility]. They needed an ICD 10 code so I gave them F90.0. Patient has current approved PA thru 01/11/27

## 2024-10-07 NOTE — Telephone Encounter (Signed)
Noted pt notified.  

## 2024-11-11 ENCOUNTER — Encounter: Payer: Self-pay | Admitting: Family Medicine

## 2024-11-11 ENCOUNTER — Ambulatory Visit (INDEPENDENT_AMBULATORY_CARE_PROVIDER_SITE_OTHER): Admitting: Family Medicine

## 2024-11-11 VITALS — BP 128/82 | HR 77 | Temp 97.8°F | Wt 323.0 lb

## 2024-11-11 DIAGNOSIS — F900 Attention-deficit hyperactivity disorder, predominantly inattentive type: Secondary | ICD-10-CM

## 2024-11-11 DIAGNOSIS — G4730 Sleep apnea, unspecified: Secondary | ICD-10-CM

## 2024-11-11 DIAGNOSIS — G4700 Insomnia, unspecified: Secondary | ICD-10-CM

## 2024-11-11 MED ORDER — LORAZEPAM 0.5 MG PO TABS: 0.5000 mg | ORAL_TABLET | Freq: Every day | ORAL | 2 refills | Status: AC

## 2024-11-11 MED ORDER — VYVANSE 30 MG PO CAPS: 30.0000 mg | ORAL_CAPSULE | Freq: Every day | ORAL | 0 refills | Status: DC

## 2024-11-11 NOTE — Progress Notes (Signed)
° °  Subjective:    Patient ID: Daniel [name], male    DOB: 12/14/1970, 53 y.o.   MRN: [id number]  HPI Here for several issues. He has been taking generic Vyvanse  30 mg about 5 mornings a week for several years, and he has been pleased with it. However 2 weeks ago his pharmacy apparently changed to a different generic manufacturer, and ever since then he has felt anxious at times and he has had trouble falling asleep. Once he does fall asleep he stays asleep, but it is often 2 am before he can fall asleep. This makes him feel tired all the time. His wife says he snores loudly.    Review of Systems  Constitutional:  Positive for fatigue.  Respiratory: Negative.    Cardiovascular: Negative.   Psychiatric/Behavioral:  Positive for sleep disturbance.        Objective:   Physical Exam Constitutional:      Appearance: He is obese.  Cardiovascular:     Rate and Rhythm: Normal rate and regular rhythm.     Pulses: Normal pulses.     Heart sounds: Normal heart sounds.  Pulmonary:     Effort: Pulmonary effort is normal.     Breath sounds: Normal breath sounds.  Neurological:     Mental Status: He is alert and oriented to person, place, and time. Mental status is at baseline.           Assessment & Plan:  He is having insomnia, and it seems the new generic form of Vyvanse  may be responsible. At his request, we will try name brand Vyvanse  instead. He can try Lorazepam  0.5 mg at bedtime to help relax. He also likely has sleep apnea, so we will refer him to Pulmonology to evaluate. Garnette Olmsted, MD

## 2024-11-24 ENCOUNTER — Encounter: Payer: Self-pay | Admitting: Family Medicine

## 2024-11-26 ENCOUNTER — Other Ambulatory Visit (HOSPITAL_BASED_OUTPATIENT_CLINIC_OR_DEPARTMENT_OTHER): Payer: Self-pay

## 2024-11-26 MED ORDER — VYVANSE 30 MG PO CAPS
30.0000 mg | ORAL_CAPSULE | Freq: Every day | ORAL | 0 refills | Status: AC
  Filled 2024-11-26: qty 30, 30d supply, fill #0

## 2024-11-26 NOTE — Telephone Encounter (Signed)
 I sent this to [facility] pharmacy

## 2024-12-02 ENCOUNTER — Other Ambulatory Visit: Payer: Self-pay | Admitting: Family Medicine

## 2024-12-06 ENCOUNTER — Other Ambulatory Visit: Payer: Self-pay | Admitting: Family Medicine

## 2024-12-20 ENCOUNTER — Ambulatory Visit: Admitting: Primary Care

## 2024-12-27 ENCOUNTER — Ambulatory Visit: Admitting: Primary Care

## 2024-12-27 DIAGNOSIS — J45909 Unspecified asthma, uncomplicated: Secondary | ICD-10-CM
